# Patient Record
Sex: Female | Born: 1935 | Race: White | Hispanic: No | Smoking: Never smoker
Health system: Southern US, Community
[De-identification: ages and names within clinical notes are randomized; demographics above are authoritative.]

## PROBLEM LIST (undated history)

## (undated) DIAGNOSIS — K219 Gastro-esophageal reflux disease without esophagitis: Secondary | ICD-10-CM

## (undated) DIAGNOSIS — Z974 Presence of external hearing-aid: Secondary | ICD-10-CM

## (undated) DIAGNOSIS — M353 Polymyalgia rheumatica: Secondary | ICD-10-CM

## (undated) DIAGNOSIS — K5790 Diverticulosis of intestine, part unspecified, without perforation or abscess without bleeding: Secondary | ICD-10-CM

## (undated) DIAGNOSIS — Z972 Presence of dental prosthetic device (complete) (partial): Secondary | ICD-10-CM

## (undated) DIAGNOSIS — E785 Hyperlipidemia, unspecified: Secondary | ICD-10-CM

## (undated) DIAGNOSIS — I10 Essential (primary) hypertension: Secondary | ICD-10-CM

## (undated) HISTORY — DX: Polymyalgia rheumatica: M35.3

## (undated) HISTORY — PX: TONSILLECTOMY: SUR1361

## (undated) HISTORY — DX: Essential (primary) hypertension: I10

## (undated) HISTORY — DX: Diverticulosis of intestine, part unspecified, without perforation or abscess without bleeding: K57.90

## (undated) HISTORY — DX: Hyperlipidemia, unspecified: E78.5

## (undated) HISTORY — DX: Gastro-esophageal reflux disease without esophagitis: K21.9

---

## 2001-02-01 ENCOUNTER — Encounter: Payer: Self-pay | Admitting: Family Medicine

## 2002-09-04 ENCOUNTER — Encounter: Payer: Self-pay | Admitting: Family Medicine

## 2004-04-08 ENCOUNTER — Ambulatory Visit: Payer: Self-pay | Admitting: Family Medicine

## 2004-04-16 ENCOUNTER — Ambulatory Visit: Payer: Self-pay | Admitting: Family Medicine

## 2004-06-17 ENCOUNTER — Ambulatory Visit: Payer: Self-pay | Admitting: Family Medicine

## 2004-10-19 ENCOUNTER — Ambulatory Visit: Payer: Self-pay | Admitting: Family Medicine

## 2004-10-27 ENCOUNTER — Encounter: Payer: Self-pay | Admitting: Family Medicine

## 2004-11-12 ENCOUNTER — Ambulatory Visit: Payer: Self-pay | Admitting: Family Medicine

## 2004-12-28 ENCOUNTER — Ambulatory Visit: Payer: Self-pay | Admitting: Family Medicine

## 2005-06-21 ENCOUNTER — Ambulatory Visit: Payer: Self-pay | Admitting: Family Medicine

## 2005-07-12 HISTORY — PX: ANKLE FRACTURE SURGERY: SHX122

## 2005-10-20 ENCOUNTER — Ambulatory Visit: Payer: Self-pay | Admitting: Internal Medicine

## 2005-11-08 ENCOUNTER — Ambulatory Visit: Payer: Self-pay | Admitting: Internal Medicine

## 2005-11-22 ENCOUNTER — Ambulatory Visit: Payer: Self-pay | Admitting: Internal Medicine

## 2006-04-24 ENCOUNTER — Ambulatory Visit: Payer: Self-pay | Admitting: Internal Medicine

## 2006-04-24 LAB — CONVERTED CEMR LAB
ALT: 17 units/L (ref 0–40)
BUN: 9 mg/dL (ref 6–23)
CO2: 30 meq/L (ref 19–32)
Calcium: 9.3 mg/dL (ref 8.4–10.5)
Chloride: 101 meq/L (ref 96–112)
Cholesterol: 216 mg/dL (ref 0–200)
Direct LDL: 132.9 mg/dL
Glucose, Bld: 98 mg/dL (ref 70–99)
Potassium: 4.7 meq/L (ref 3.5–5.1)
Total CHOL/HDL Ratio: 3.1

## 2006-05-02 ENCOUNTER — Encounter: Payer: Self-pay | Admitting: Internal Medicine

## 2006-05-29 ENCOUNTER — Ambulatory Visit: Payer: Self-pay | Admitting: Internal Medicine

## 2006-05-29 LAB — CONVERTED CEMR LAB
ALT: 14 units/L (ref 0–40)
Cholesterol: 186 mg/dL (ref 0–200)
Total CHOL/HDL Ratio: 2.8
Triglycerides: 96 mg/dL (ref 0–149)

## 2006-07-06 ENCOUNTER — Ambulatory Visit: Payer: Self-pay | Admitting: Internal Medicine

## 2006-10-10 DIAGNOSIS — K5730 Diverticulosis of large intestine without perforation or abscess without bleeding: Secondary | ICD-10-CM | POA: Insufficient documentation

## 2006-10-10 DIAGNOSIS — I10 Essential (primary) hypertension: Secondary | ICD-10-CM | POA: Insufficient documentation

## 2006-10-10 DIAGNOSIS — M810 Age-related osteoporosis without current pathological fracture: Secondary | ICD-10-CM

## 2006-10-10 DIAGNOSIS — M353 Polymyalgia rheumatica: Secondary | ICD-10-CM | POA: Insufficient documentation

## 2006-10-10 DIAGNOSIS — K219 Gastro-esophageal reflux disease without esophagitis: Secondary | ICD-10-CM | POA: Insufficient documentation

## 2006-10-10 DIAGNOSIS — E785 Hyperlipidemia, unspecified: Secondary | ICD-10-CM | POA: Insufficient documentation

## 2006-10-23 ENCOUNTER — Ambulatory Visit: Payer: Self-pay | Admitting: Internal Medicine

## 2006-10-24 LAB — CONVERTED CEMR LAB
ALT: 14 units/L (ref 0–35)
BUN: 8 mg/dL (ref 6–23)
Calcium: 9.1 mg/dL (ref 8.4–10.5)
Chloride: 102 meq/L (ref 96–112)
Cholesterol: 175 mg/dL (ref 0–200)
GFR calc Af Amer: 89 mL/min
GFR calc non Af Amer: 73 mL/min
Glucose, Bld: 96 mg/dL (ref 70–99)
VLDL: 15 mg/dL (ref 0–40)

## 2006-12-06 ENCOUNTER — Ambulatory Visit: Payer: Self-pay | Admitting: *Deleted

## 2006-12-26 ENCOUNTER — Ambulatory Visit: Payer: Self-pay | Admitting: Internal Medicine

## 2007-04-25 ENCOUNTER — Ambulatory Visit: Payer: Self-pay | Admitting: Internal Medicine

## 2007-04-26 LAB — CONVERTED CEMR LAB
Albumin: 3.8 g/dL (ref 3.5–5.2)
BUN: 10 mg/dL (ref 6–23)
Basophils Absolute: 0 10*3/uL (ref 0.0–0.1)
Bilirubin, Direct: 0.2 mg/dL (ref 0.0–0.3)
CO2: 29 meq/L (ref 19–32)
Calcium: 9.6 mg/dL (ref 8.4–10.5)
Cholesterol: 188 mg/dL (ref 0–200)
Creatinine, Ser: 1 mg/dL (ref 0.4–1.2)
Eosinophils Absolute: 0.1 10*3/uL (ref 0.0–0.6)
GFR calc non Af Amer: 57 mL/min
HDL: 71.2 mg/dL (ref >39.0)
Hemoglobin: 13.1 g/dL (ref 12.0–15.0)
LDL Cholesterol: 101 mg/dL — ABNORMAL HIGH (ref 0–99)
Lymphocytes Relative: 26.5 % (ref 12.0–46.0)
MCHC: 33.5 g/dL (ref 30.0–36.0)
MCV: 91.8 fL (ref 78.0–100.0)
Monocytes Absolute: 0.3 10*3/uL (ref 0.2–0.7)
Monocytes Relative: 9.4 % (ref 3.0–11.0)
Neutro Abs: 2.2 10*3/uL (ref 1.4–7.7)
Neutrophils Relative %: 62.4 % (ref 43.0–77.0)
Potassium: 4.2 meq/L (ref 3.5–5.1)
Sed Rate: 17 mm/hr (ref 0–25)
TSH: 0.84 microintl units/mL (ref 0.35–5.50)

## 2007-07-18 ENCOUNTER — Telehealth: Payer: Self-pay | Admitting: Internal Medicine

## 2007-10-03 ENCOUNTER — Telehealth (INDEPENDENT_AMBULATORY_CARE_PROVIDER_SITE_OTHER): Payer: Self-pay | Admitting: *Deleted

## 2007-10-30 ENCOUNTER — Ambulatory Visit: Payer: Self-pay | Admitting: Internal Medicine

## 2007-10-30 DIAGNOSIS — L570 Actinic keratosis: Secondary | ICD-10-CM

## 2007-10-31 LAB — CONVERTED CEMR LAB
ALT: 13 units/L (ref 0–35)
AST: 21 units/L (ref 0–37)
Alkaline Phosphatase: 70 units/L (ref 39–117)
CO2: 28 meq/L (ref 19–32)
Calcium: 9.6 mg/dL (ref 8.4–10.5)
Creatinine, Ser: 0.8 mg/dL (ref 0.4–1.2)
Glucose, Bld: 93 mg/dL (ref 70–99)
HDL: 70.8 mg/dL (ref >39.0)
Phosphorus: 3.2 mg/dL (ref 2.3–4.6)
Sodium: 139 meq/L (ref 135–145)
Total Bilirubin: 0.7 mg/dL (ref 0.3–1.2)
Total CHOL/HDL Ratio: 2.7
Triglycerides: 83 mg/dL (ref 0–149)

## 2007-11-27 ENCOUNTER — Telehealth: Payer: Self-pay | Admitting: Internal Medicine

## 2008-03-17 ENCOUNTER — Telehealth: Payer: Self-pay | Admitting: Internal Medicine

## 2008-05-05 ENCOUNTER — Ambulatory Visit: Payer: Self-pay | Admitting: Internal Medicine

## 2008-10-14 ENCOUNTER — Ambulatory Visit: Payer: Self-pay | Admitting: Internal Medicine

## 2008-10-16 LAB — CONVERTED CEMR LAB
Alkaline Phosphatase: 54 units/L (ref 39–117)
BUN: 11 mg/dL (ref 6–23)
Bilirubin, Direct: 0.1 mg/dL (ref 0.0–0.3)
CO2: 29 meq/L (ref 19–32)
Calcium: 9.5 mg/dL (ref 8.4–10.5)
Chloride: 105 meq/L (ref 96–112)
Cholesterol: 162 mg/dL (ref 0–200)
Creatinine, Ser: 0.8 mg/dL (ref 0.4–1.2)
Eosinophils Absolute: 0.1 10*3/uL (ref 0.0–0.7)
Eosinophils Relative: 2.1 % (ref 0.0–5.0)
HCT: 36.7 % (ref 36.0–46.0)
LDL Cholesterol: 83 mg/dL (ref 0–99)
Lymphs Abs: 1 10*3/uL (ref 0.7–4.0)
MCHC: 33.8 g/dL (ref 30.0–36.0)
MCV: 95.8 fL (ref 78.0–100.0)
Monocytes Absolute: 0.4 10*3/uL (ref 0.1–1.0)
Neutrophils Relative %: 51.5 % (ref 43.0–77.0)
Platelets: 171 10*3/uL (ref 150.0–400.0)
Potassium: 5.2 meq/L — ABNORMAL HIGH (ref 3.5–5.1)
RDW: 11.7 % (ref 11.5–14.6)
Sed Rate: 10 mm/hr (ref 0–22)
TSH: 0.93 microintl units/mL (ref 0.35–5.50)
Total Bilirubin: 0.8 mg/dL (ref 0.3–1.2)
Total CHOL/HDL Ratio: 2
Total Protein: 6.1 g/dL (ref 6.0–8.3)
VLDL: 9.4 mg/dL (ref 0.0–40.0)
WBC: 3.1 10*3/uL — ABNORMAL LOW (ref 4.5–10.5)

## 2008-12-25 ENCOUNTER — Ambulatory Visit: Payer: Self-pay | Admitting: Internal Medicine

## 2009-05-29 ENCOUNTER — Ambulatory Visit: Payer: Self-pay | Admitting: Internal Medicine

## 2009-06-01 LAB — CONVERTED CEMR LAB
Albumin: 3.8 g/dL (ref 3.5–5.2)
Alkaline Phosphatase: 59 units/L (ref 39–117)
Basophils Relative: 0.1 % (ref 0.0–3.0)
CO2: 29 meq/L (ref 19–32)
Calcium: 9.4 mg/dL (ref 8.4–10.5)
Chloride: 101 meq/L (ref 96–112)
Eosinophils Relative: 1.6 % (ref 0.0–5.0)
HCT: 36.9 % (ref 36.0–46.0)
HDL: 95 mg/dL (ref >39.00)
LDL Cholesterol: 70 mg/dL (ref 0–99)
Lymphs Abs: 0.9 10*3/uL (ref 0.7–4.0)
MCHC: 33.5 g/dL (ref 30.0–36.0)
MCV: 94.2 fL (ref 78.0–100.0)
Monocytes Absolute: 0.5 10*3/uL (ref 0.1–1.0)
Neutro Abs: 1.6 10*3/uL (ref 1.4–7.7)
RBC: 3.92 M/uL (ref 3.87–5.11)
Sodium: 134 meq/L — ABNORMAL LOW (ref 135–145)
Total Bilirubin: 0.6 mg/dL (ref 0.3–1.2)
Total CHOL/HDL Ratio: 2
Triglycerides: 35 mg/dL (ref 0.0–149.0)
WBC: 3 10*3/uL — ABNORMAL LOW (ref 4.5–10.5)

## 2009-06-16 ENCOUNTER — Ambulatory Visit: Payer: Self-pay | Admitting: Ophthalmology

## 2009-06-16 ENCOUNTER — Encounter: Payer: Self-pay | Admitting: Internal Medicine

## 2009-07-01 ENCOUNTER — Ambulatory Visit: Payer: Self-pay | Admitting: Ophthalmology

## 2009-12-02 ENCOUNTER — Ambulatory Visit: Payer: Self-pay | Admitting: Internal Medicine

## 2009-12-04 LAB — CONVERTED CEMR LAB
Albumin: 3.7 g/dL (ref 3.5–5.2)
BUN: 13 mg/dL (ref 6–23)
Basophils Absolute: 0 10*3/uL (ref 0.0–0.1)
CO2: 29 meq/L (ref 19–32)
Calcium: 9.9 mg/dL (ref 8.4–10.5)
Chloride: 102 meq/L (ref 96–112)
GFR calc non Af Amer: 79.49 mL/min (ref >60)
HCT: 31.9 % — ABNORMAL LOW (ref 36.0–46.0)
Hemoglobin: 10.9 g/dL — ABNORMAL LOW (ref 12.0–15.0)
Lymphs Abs: 0.8 10*3/uL (ref 0.7–4.0)
MCHC: 34.2 g/dL (ref 30.0–36.0)
MCV: 94.8 fL (ref 78.0–100.0)
Monocytes Relative: 12.2 % — ABNORMAL HIGH (ref 3.0–12.0)
Neutro Abs: 2.1 10*3/uL (ref 1.4–7.7)
RDW: 13.5 % (ref 11.5–14.6)

## 2009-12-09 ENCOUNTER — Ambulatory Visit: Payer: Self-pay | Admitting: Internal Medicine

## 2009-12-09 DIAGNOSIS — D649 Anemia, unspecified: Secondary | ICD-10-CM | POA: Insufficient documentation

## 2009-12-10 LAB — CONVERTED CEMR LAB
Basophils Relative: 0.6 % (ref 0.0–3.0)
Eosinophils Relative: 1.2 % (ref 0.0–5.0)
Ferritin: 6.9 ng/mL — ABNORMAL LOW (ref 10.0–291.0)
Lymphocytes Relative: 25.8 % (ref 12.0–46.0)
MCV: 93.6 fL (ref 78.0–100.0)
Neutrophils Relative %: 58.3 % (ref 43.0–77.0)
RBC: 3.53 M/uL — ABNORMAL LOW (ref 3.87–5.11)
WBC: 3.6 10*3/uL — ABNORMAL LOW (ref 4.5–10.5)

## 2010-01-13 ENCOUNTER — Ambulatory Visit: Payer: Self-pay | Admitting: Internal Medicine

## 2010-01-15 LAB — CONVERTED CEMR LAB
Basophils Relative: 0.6 % (ref 0.0–3.0)
Eosinophils Absolute: 0.1 10*3/uL (ref 0.0–0.7)
HCT: 34.7 % — ABNORMAL LOW (ref 36.0–46.0)
Hemoglobin: 11.9 g/dL — ABNORMAL LOW (ref 12.0–15.0)
MCHC: 34.2 g/dL (ref 30.0–36.0)
MCV: 94.5 fL (ref 78.0–100.0)
Monocytes Absolute: 0.4 10*3/uL (ref 0.1–1.0)
Neutro Abs: 1.9 10*3/uL (ref 1.4–7.7)
RBC: 3.67 M/uL — ABNORMAL LOW (ref 3.87–5.11)

## 2010-01-18 ENCOUNTER — Ambulatory Visit: Payer: Self-pay | Admitting: Internal Medicine

## 2010-01-18 DIAGNOSIS — L600 Ingrowing nail: Secondary | ICD-10-CM

## 2010-03-14 HISTORY — PX: CATARACT EXTRACTION: SUR2

## 2010-04-13 NOTE — Assessment & Plan Note (Signed)
Summary: FOLLOW UP / [facility]FW   Vital Signs:  Patient profile:   76 year old female Weight:      148 pounds BMI:     24.72 Temp:     98.5 degrees F oral Pulse rate:   72 / minute Pulse rhythm:   regular BP sitting:   120 / 70  (left arm) Cuff size:   regular  Vitals Entered By: Mervin Hack CMA Duncan Dull) (December 02, 2009 8:20 AM) CC: 6 month follow-up   History of Present Illness: Feels well Has ill family members---40 year old brother in hospital now. Some stress Likes to visit at health care and [facility] No new health concerns  Wonders about getting off the statin No myalgias discussed the statistical decrease in MI risk but not necessarily mortality  Occ arthritic pain occ takes aleve but not regularly only if she overdoes it --like working in yard  Continues on omeprazole seems to control her reflux  No chest pain  No SOB  Lots of moles seeing derm next month  Allergies: 1)  Fosamax (Alendronate Sodium)  Past History:  Past medical, surgical, family and social histories (including risk factors) reviewed for relevance to current acute and chronic problems.  Past Medical History: Reviewed history from 10/10/2006 and no changes required. Diverticulosis, colon GERD Hyperlipidemia Hypertension Osteoporosis Polymyalgia rheumatica  Past Surgical History: Reviewed history from 10/10/2006 and no changes required. Dexa-osteopenia 05/03 Tonsillectomy Colonoscopy- diverticuli, int. hemorrhoids 04/99 Dexa- Osteoporosis  03/05 Left ankle fx Ether Griffins) 05/07 Bone Density 02/08  Family History: Reviewed history from 10/10/2006 and no changes required. Father: Died at age 24 (? heart) Mother: Died at age 26, RA Siblings: One brother died with diabetes, sister deceased, polio- was twin sister. Brother died of old age, sister died with ovarian cancer, sister died with ? brain cancer, sister CVA died in her 97's.  Social History: Reviewed history from  12/26/2006 and no changes required.  Widowed Children: 2 daughters Occupation: Use to farm, housewife  Never Smoked Alcohol use-rare  Requests DNR 2 daughters but neither in town. Feels they should share the responsibility of health care POA. Requests no feeding tube. Does not want extended ventilator support.  Review of Systems       Weight is stable appetite is great sleeps okay  Physical Exam  General:  alert and normal appearance.   Neck:  supple, no masses, no thyromegaly, no carotid bruits, and no cervical lymphadenopathy.   Lungs:  normal respiratory effort, no intercostal retractions, no accessory muscle use, and normal breath sounds.   Heart:  normal rate, regular rhythm, no murmur, and no gallop.   Abdomen:  soft and non-tender.   Msk:  no joint tenderness and no joint swelling.   Pulses:  1+ in feet Extremities:  no edema Neurologic:  alert & oriented X3, strength normal in all extremities, and gait normal.   Psych:  normally interactive, good eye contact, not anxious appearing, and not depressed appearing.     Impression & Recommendations:  Problem # 1:  HYPERTENSION (ICD-401.9) Assessment Unchanged  doing well no changes needed  Her updated medication list for this problem includes:    Hyzaar 50-12.5 Mg Tabs (Losartan potassium-hctz) .Marland Kitchen... Take one by mouth once a day  BP today: 120/70 Prior BP: 120/60 (05/29/2009)  Labs Reviewed: K+: 4.1 (05/29/2009) Creat: : 0.8 (05/29/2009)   Chol: 172 (05/29/2009)   HDL: 95.00 (05/29/2009)   LDL: 70 (05/29/2009)   TG: 35.0 (05/29/2009)  Orders:  TLB-Renal Function Panel (80069-RENAL) TLB-CBC Platelet - w/Differential (85025-CBCD)  Problem # 2:  HYPERLIPIDEMIA (ICD-272.4) Assessment: Unchanged discussed primary prevention will continue but stop the fish oil since triglycerides very low  Her updated medication list for this problem includes:    Pravachol 20 Mg Tabs (Pravastatin sodium) .Marland Kitchen... Take one by mouth  once a day  Problem # 3:  GERD (ICD-530.81) Assessment: Unchanged controlled on the med  Her updated medication list for this problem includes:    Prilosec 20 Mg Cpdr (Omeprazole) .Marland Kitchen... Take one by mouth once a day  Problem # 4:  POLYMYALGIA RHEUMATICA (ICD-725) Assessment: Comment Only  no evidence of recurrence  Her updated medication list for this problem includes:    Aspirin 81 Mg Tbec (Aspirin) .Marland Kitchen... Take one by mouth once a day  Orders: TLB-Sedimentation Rate (ESR) (85652-ESR) Venipuncture (16109)  Complete Medication List: 1)  Prilosec 20 Mg Cpdr (Omeprazole) .... Take one by mouth once a day 2)  Pravachol 20 Mg Tabs (Pravastatin sodium) .... Take one by mouth once a day 3)  Hyzaar 50-12.5 Mg Tabs (Losartan potassium-hctz) .... Take one by mouth once a day 4)  Calcium-vitamin D 600-200 Mg-unit Tabs (Calcium-vitamin d) .... Take 1 by mouth once daily 5)  Womens Multivitamin Plus Tabs (Multiple vitamins-minerals) .... Take 1 by mouth once daily 6)  Co Q-10 150 Mg Caps (Coenzyme q10) .... Once daily 7)  Aspirin 81 Mg Tbec (Aspirin) .... Take one by mouth once a day  Patient Instructions: 1)  Please try off the fish oil---you can restart it if you feel it makes you feel better 2)  Please schedule a follow-up appointment in 6 months .   Current Allergies (reviewed today): FOSAMAX (ALENDRONATE SODIUM)   Immunization History:  Tetanus/Td Immunization History:    Tetanus/Td:  Td (07/09/2002)  Influenza Immunization History:    Influenza:  Fluvax 3+ (12/25/2008)  Pneumovax Immunization History:    Pneumovax:  Historical (03/14/2004)  Zostavax History:    Zostavax # 1:  Zostavax (05/03/2006)

## 2010-04-13 NOTE — Assessment & Plan Note (Signed)
Summary: INGROWN TOE NAILS ON BOTH FEET / LFW   Vital Signs:  Patient profile:   77 year old female Weight:      150 pounds Temp:     98.4 degrees F oral BP sitting:   128 / 70  (left arm) Cuff size:   regular  Vitals Entered By: Mervin Hack CMA Duncan Dull) (January 18, 2010 10:23 AM) CC: ingrown toenails   History of Present Illness: Has had some ongoing problems with ingrown nails on both feet wears open toe sandals in summer-not as much trouble now with closed shoes, she has more pain  Pain mostly on medial sides of both great toes---right > left no sig inflammation  No discharge  Allergies: 1)  Fosamax (Alendronate Sodium)  Past History:  Past medical, surgical, family and social histories (including risk factors) reviewed for relevance to current acute and chronic problems.  Past Medical History: Reviewed history from 10/10/2006 and no changes required. Diverticulosis, colon GERD Hyperlipidemia Hypertension Osteoporosis Polymyalgia rheumatica  Past Surgical History: Reviewed history from 10/10/2006 and no changes required. Dexa-osteopenia 05/03 Tonsillectomy Colonoscopy- diverticuli, int. hemorrhoids 04/99 Dexa- Osteoporosis  03/05 Left ankle fx Ether Griffins) 05/07 Bone Density 02/08  Family History: Reviewed history from 10/10/2006 and no changes required. Father: Died at age 76 (? heart) Mother: Died at age 15, RA Siblings: One brother died with diabetes, sister deceased, polio- was twin sister. Brother died of old age, sister died with ovarian cancer, sister died with ? brain cancer, sister CVA died in her 76's.  Social History: Reviewed history from 12/26/2006 and no changes required.  Widowed Children: 2 daughters Occupation: Use to farm, housewife  Never Smoked Alcohol use-rare  Requests DNR 2 daughters but neither in town. Feels they should share the responsibility of health care POA. Requests no feeding tube. Does not want extended ventilator  support.  Review of Systems       reviewed blood work ---I had recommended stopping iron she wants to continue at least part of the time  Physical Exam  General:  alert and normal appearance.   Extremities:  mild distal ingrowing of thick dystrophic nails  right great toe > left using elevator, I mobilized the nail and trimmed them both back--including some calloused area on medial right great toe   Impression & Recommendations:  Problem # 1:  INGROWN TOENAIL (ICD-703.0) Assessment New distal so not as severe trimmed here I instructed her on soaking, then moving back cuticle and trimming on her own  Complete Medication List: 1)  Pravachol 20 Mg Tabs (Pravastatin sodium) .... Take one by mouth once a day 2)  Hyzaar 50-12.5 Mg Tabs (Losartan potassium-hctz) .... Take one by mouth once a day 3)  Calcium-vitamin D 600-200 Mg-unit Tabs (Calcium-vitamin d) .... Take 1 by mouth once daily 4)  Womens Multivitamin Plus Tabs (Multiple vitamins-minerals) .... Take 1 by mouth once daily 5)  Aspirin 81 Mg Tbec (Aspirin) .... Take one by mouth once a day  Patient Instructions: 1)  Please keep regualr follow up   Orders Added: 1)  Est. Patient Level III [15176]    Current Allergies (reviewed today): FOSAMAX (ALENDRONATE SODIUM)

## 2010-04-13 NOTE — Assessment & Plan Note (Signed)
Summary: 6 MONTH FOLLOW UP/[facility]   Vital Signs:  Patient profile:   77 year old female Weight:      149 pounds Temp:     98 degrees F oral Pulse rate:   60 / minute Pulse rhythm:   regular BP sitting:   120 / 60  (left arm) Cuff size:   regular  Vitals Entered By: Mervin Hack CMA Duncan Dull) (May 29, 2009 8:10 AM) CC: 6 month follow-up   History of Present Illness: Doing "great"  Setting up appt for right cataract extraction Left isn't ready  Notes her memory is not quite as good as before No problems with executive function No apraxia Does regular exercise---walks and water aerobics---discussed the protective effect on cognitive functrion  Occ ankle pain from past fracture using brace for walking No sig other arthritic problems  Has some itching in back--esp at night no clear etiology no rash  More GI gas not sure why can get some relief from [facility]  Ingrown toenail --lateral right great toe discussed that I can remove as needed   Still with uterine prolapse not clearly worse No incontinence but will have some urgency She does push it back up sometimes  No chest pain No SOB No edema No change in exercise tolerance   Allergies: 1)  Fosamax (Alendronate Sodium)  Past History:  Past medical, surgical, family and social histories (including risk factors) reviewed for relevance to current acute and chronic problems.  Past Medical History: Reviewed history from 10/10/2006 and no changes required. Diverticulosis, colon GERD Hyperlipidemia Hypertension Osteoporosis Polymyalgia rheumatica  Past Surgical History: Reviewed history from 10/10/2006 and no changes required. Dexa-osteopenia 05/03 Tonsillectomy Colonoscopy- diverticuli, int. hemorrhoids 04/99 Dexa- Osteoporosis  03/05 Left ankle fx Ether Griffins) 05/07 Bone Density 02/08  Family History: Reviewed history from 10/10/2006 and no changes required. Father: Died at age 82 (? heart) Mother: Died  at age 61, RA Siblings: One brother died with diabetes, sister deceased, polio- was twin sister. Brother died of old age, sister died with ovarian cancer, sister died with ? brain cancer, sister CVA died in her 31's.  Social History: Reviewed history from 12/26/2006 and no changes required.  Widowed Children: 2 daughters Occupation: Use to farm, housewife  Never Smoked Alcohol use-rare  Requests DNR 2 daughters but neither in town. Feels they should share the responsibility of health care POA. Requests no feeding tube. Does not want extended ventilator support.  Review of Systems       wonders about ear was some decreased hearing Weight is stable Appetite is fine Sleeps well Stays happy  Physical Exam  General:  alert and normal appearance.   Neck:  supple, no masses, no thyromegaly, no carotid bruits, and no cervical lymphadenopathy.   Lungs:  normal respiratory effort and normal breath sounds.   Heart:  normal rate, regular rhythm, no murmur, and no gallop.   Abdomen:  soft and non-tender.   Msk:  no joint tenderness and no joint swelling.   Pulses:  1+ in feet Extremities:  No edema slightly lateral ingrowing in right 1st toe Skin:  seb keratosis on upper back--slight surrounding irritation Psych:  normally interactive, good eye contact, not anxious appearing, and not depressed appearing.     Impression & Recommendations:  Problem # 1:  HYPERTENSION (ICD-401.9) Assessment Unchanged  good control no changes needed  Her updated medication list for this problem includes:    Hyzaar 50-12.5 Mg Tabs (Losartan potassium-hctz) .Marland Kitchen... Take one by mouth once a  day  BP today: 120/60 Prior BP: 122/68 (10/14/2008)  Labs Reviewed: K+: 5.2 (10/14/2008) Creat: : 0.8 (10/14/2008)   Chol: 162 (10/14/2008)   HDL: 69.80 (10/14/2008)   LDL: 83 (10/14/2008)   TG: 47.0 (10/14/2008)  Orders: TLB-Renal Function Panel (80069-RENAL) TLB-CBC Platelet - w/Differential  (85025-CBCD) TLB-TSH (Thyroid Stimulating Hormone) (84443-TSH)  Problem # 2:  HYPERLIPIDEMIA (ICD-272.4) Assessment: Unchanged  good control wiil check labs  Her updated medication list for this problem includes:    Pravachol 20 Mg Tabs (Pravastatin sodium) .Marland Kitchen... Take one by mouth once a day  Labs Reviewed: SGOT: 20 (10/14/2008)   SGPT: 14 (10/14/2008)   HDL:69.80 (10/14/2008), 70.8 (10/30/2007)  LDL:83 (10/14/2008), 104 (16/12/9602)  Chol:162 (10/14/2008), 191 (10/30/2007)  Trig:47.0 (10/14/2008), 83 (10/30/2007)  Orders: TLB-Lipid Panel (80061-LIPID) TLB-Hepatic/Liver Function Pnl (80076-HEPATIC)  Problem # 3:  POLYMYALGIA RHEUMATICA (ICD-725) Assessment: Unchanged  no symptoms will check sed rate  Orders: TLB-Sedimentation Rate (ESR) (85652-ESR) Venipuncture (54098)  Problem # 4:  OSTEOPOROSIS (ICD-733.00) Assessment: Unchanged on vitamin D regular weight bearing exercise  Her updated medication list for this problem includes:    Calcium-vitamin D 600-200 Mg-unit Tabs (Calcium-vitamin d) .Marland Kitchen... Take 1 by mouth once daily  Complete Medication List: 1)  Pravachol 20 Mg Tabs (Pravastatin sodium) .... Take one by mouth once a day 2)  Aspirin 81 Mg Tbec (Aspirin) .... Take one by mouth once a day 3)  Prilosec 20 Mg Cpdr (Omeprazole) .... Take one by mouth once a day 4)  Hyzaar 50-12.5 Mg Tabs (Losartan potassium-hctz) .... Take one by mouth once a day 5)  Calcium-vitamin D 600-200 Mg-unit Tabs (Calcium-vitamin d) .... Take 1 by mouth once daily 6)  Womens Multivitamin Plus Tabs (Multiple vitamins-minerals) .... Take 1 by mouth once daily 7)  Fish Oil 1000 Mg Caps (Omega-3 fatty acids) .... Once daily 8)  Co Q-10 150 Mg Caps (Coenzyme q10) .... Once daily  Patient Instructions: 1)  Please schedule a follow-up appointment in 6 months .   Current Allergies (reviewed today): FOSAMAX (ALENDRONATE SODIUM)

## 2010-04-21 ENCOUNTER — Encounter: Payer: Self-pay | Admitting: Internal Medicine

## 2010-04-21 LAB — HM COLONOSCOPY

## 2010-04-21 LAB — FECAL OCCULT BLOOD, GUAIAC: Fecal Occult Blood: NEGATIVE

## 2010-04-23 ENCOUNTER — Encounter: Payer: Self-pay | Admitting: Internal Medicine

## 2010-05-11 NOTE — Miscellaneous (Signed)
Summary: Clearance for Exercise Program/[place]  Clearance for Exercise Program/[place]   Imported By: Lanelle Bal 05/04/2010 08:58:58  _____________________________________________________________________  External Attachment:    Type:   Image     Comment:   External Document

## 2010-06-09 ENCOUNTER — Encounter: Payer: Self-pay | Admitting: Internal Medicine

## 2010-06-09 ENCOUNTER — Ambulatory Visit (INDEPENDENT_AMBULATORY_CARE_PROVIDER_SITE_OTHER): Payer: Medicare Other | Admitting: Internal Medicine

## 2010-06-09 VITALS — BP 143/61 | HR 68 | Temp 98.5°F | Ht 65.0 in | Wt 150.0 lb

## 2010-06-09 DIAGNOSIS — I10 Essential (primary) hypertension: Secondary | ICD-10-CM

## 2010-06-09 DIAGNOSIS — M353 Polymyalgia rheumatica: Secondary | ICD-10-CM

## 2010-06-09 DIAGNOSIS — K219 Gastro-esophageal reflux disease without esophagitis: Secondary | ICD-10-CM

## 2010-06-09 DIAGNOSIS — E785 Hyperlipidemia, unspecified: Secondary | ICD-10-CM

## 2010-06-09 LAB — BASIC METABOLIC PANEL
CO2: 29 mEq/L (ref 19–32)
Calcium: 9.7 mg/dL (ref 8.4–10.5)
Creatinine, Ser: 0.7 mg/dL (ref 0.4–1.2)
Glucose, Bld: 89 mg/dL (ref 70–99)
Sodium: 132 mEq/L — ABNORMAL LOW (ref 135–145)

## 2010-06-09 LAB — LIPID PANEL
HDL: 85.1 mg/dL (ref >39.00)
Triglycerides: 68 mg/dL (ref 0.0–149.0)

## 2010-06-09 NOTE — Progress Notes (Signed)
  Subjective:    [name], female    DOB: 1923/09/14, 77 y.o.   MRN: [id number]  HPI "I feel like my life is changing--I'm getting older" Thinks it "may be a changing year for me"  Cataract done on right eye--not much help Has needed new glasses--notes change but can see okay  Has started with the fitness program at [facility] Has been doing some work outside Has had some leg pain associated with this Went to [facility] podiatry to check left ankle pain after twisting---x-ray negative Notes some improvement lately  Ingrown nails are better  No chest pain  No SOB  Did stop the statin in February No difference in how she feels Had discussed questions about primary prevention  Stomach is fine Continues on the omeprazole  Past Medical History  Diagnosis Date  . Diverticulosis   . GERD (gastroesophageal reflux disease)   . Hyperlipidemia   . Hypertension   . Osteoporosis   . Polymyalgia rheumatica     Past Surgical History  Procedure Date  . Tonsillectomy   . Ankle fracture surgery 07/12/2005  . Cataract extraction 2012    right    Family History  Problem Relation Age of Onset  . Arthritis Mother   . Heart disease Father 61  . Diabetes Brother   . Cancer Sister     ovarian  . Stroke Sister     History   Social History  . Marital Status: Widowed    Spouse Name: N/A    Number of Children: 2  . Years of Education: N/A   Occupational History  . House wife, farming    Social History Main Topics  . Smoking status: Never Smoker   . Smokeless tobacco: Not on file  . Alcohol Use: Yes  . Drug Use:   . Sexually Active:    Other Topics Concern  . Not on file   Social History Narrative   Request DNR.  Had two daughters but neither in town.  Feels they should share responsibility of health care POA.  Request no feeding tube.  Does not want extended ventilator support.     Review of Systems Had recent dental work done---decided on extraction  instead of root canal, etc Appetite is fine Weight is stable Sleeps okay Bladder "more active"--has prolapsed uterus    Objective:   Physical Exam  Constitutional: She appears well-developed and well-nourished. No distress.  Neck: Normal range of motion. Neck supple. No thyromegaly present.  Cardiovascular: Normal rate, regular rhythm and normal heart sounds.  Exam reveals no gallop.   No murmur heard. Pulmonary/Chest: Effort normal and breath sounds normal. No respiratory distress. She has no wheezes. She has no rales.  Abdominal: Soft. She exhibits no mass. There is no tenderness.  Musculoskeletal: Normal range of motion. She exhibits no edema and no tenderness.  Lymphadenopathy:    She has no cervical adenopathy.  Skin: No rash noted.  Psychiatric: She has a normal mood and affect. Judgment and thought content normal.          Assessment & Plan:

## 2010-10-07 ENCOUNTER — Ambulatory Visit (INDEPENDENT_AMBULATORY_CARE_PROVIDER_SITE_OTHER): Payer: Medicare Other | Admitting: Family Medicine

## 2010-10-07 VITALS — BP 112/70 | HR 73 | Temp 98.8°F | Wt 150.0 lb

## 2010-10-07 DIAGNOSIS — J029 Acute pharyngitis, unspecified: Secondary | ICD-10-CM

## 2010-10-07 DIAGNOSIS — J069 Acute upper respiratory infection, unspecified: Secondary | ICD-10-CM

## 2010-10-07 NOTE — Patient Instructions (Signed)
Drink plenty of fluids, take tylenol as needed, and gargle with warm salt water for your throat.  This should gradually improve.  Take care.  Let us know if you have other concerns.    

## 2010-10-07 NOTE — Progress Notes (Signed)
duration of symptoms: ST started yesterday, worse last night.   Rhinorrhea: minimal congestion:no ear pain:no sore throat: as above Cough: minimal, today myalgias:no other concerns:voice change noted Leaving for [place], but not until mid August No fevers.    ROS: See HPI.  Otherwise negative.    Meds, vitals, and allergies reviewed.   GEN: nad, alert and oriented HEENT: mucous membranes moist, TM w/o erythema, nasal epithelium injected, OP with cobblestoning- mild NECK: supple w/o LA CV: rrr. PULM: ctab, no inc wob ABD: soft, +bs  RST neg.

## 2010-10-07 NOTE — Assessment & Plan Note (Signed)
Likely benign, self-limited viral proceed.  ddx d/w pt.  Supportive care, f/u prn.  No indication for abx.  RST neg.

## 2010-10-11 ENCOUNTER — Other Ambulatory Visit: Payer: Self-pay | Admitting: Family Medicine

## 2010-10-11 MED ORDER — BENZONATATE 100 MG PO CAPS: 100.0000 mg | ORAL_CAPSULE | Freq: Four times a day (QID) | ORAL | Status: DC | PRN

## 2010-10-11 NOTE — Progress Notes (Signed)
Received email from Rowe Clack, RN at [facility]:  Hi Dr. Dayton Martes, I saw Ann Odom today in the clinic and she sounded terrible (very congested and coughing). She told me that she had seen Dr. Para March last Thursday in place of Dr. Alphonsus Sias and he told her that there was no infection and that the symptoms would go away on their own. They have actually gotten worse and she is coughing quite frequently. She does not have a temperature but says she feels just "run down and weak". I called the office but haven't heard back and she was wanting to know if something could be done this afternoon. She specifically asked for something for her cough-is there anything over the counter I could tell her to try or does she need to come in to be seen?  Thanks, Rowe Clack, RN, BSN  I wrote back stating that I could send Ann Odom into her pharmacy but pt would need to be seen before I could prescribe an antibiotic. Rx for [facility] sent to [facility] in [place].

## 2010-10-14 ENCOUNTER — Encounter: Payer: Self-pay | Admitting: Family Medicine

## 2010-10-14 ENCOUNTER — Ambulatory Visit (INDEPENDENT_AMBULATORY_CARE_PROVIDER_SITE_OTHER): Payer: Medicare Other | Admitting: Family Medicine

## 2010-10-14 VITALS — BP 130/80 | HR 79 | Temp 98.1°F | Wt 154.5 lb

## 2010-10-14 DIAGNOSIS — J069 Acute upper respiratory infection, unspecified: Secondary | ICD-10-CM

## 2010-10-14 MED ORDER — AZITHROMYCIN 250 MG PO TABS: ORAL_TABLET | ORAL | Status: AC

## 2010-10-14 NOTE — Progress Notes (Signed)
77 yo here for follow up of URI symptoms. RST was neg. Saw Dr. Para March on 10/07/2010, advised supportive care as likely viral illness. On 7/30, I sent in rx for Tessalon as cough was worsening.  Today, feels some symptoms are better but others are worse. Cough is now productive. Has sinus pressure.  No fevers, some chills and fatigue..    ROS: See HPI.  Otherwise negative.    Meds, vitals, and allergies reviewed.  BP 130/80  Pulse 79  Temp(Src) 98.1 F (36.7 C) (Oral)  Wt 154 lb 8 oz (70.081 kg)  GEN: nad, alert and oriented HEENT: mucous membranes moist, TM w/o erythema, nasal epithelium injected, OP with cobblestoning- mild NECK: supple w/o LA CV: rrr. PULM: +faint rhonchi RLL ABD: soft, +bs  Assessment and Plan: 1. URI (upper respiratory infection)   Now with rhonchi on exam. Given duration and progression of symptoms, will treat for bacterial sinusitis/bronchitis with Zpack. Continue supportive care as discussed. The patient indicates understanding of these issues and agrees with the plan.

## 2010-11-05 ENCOUNTER — Ambulatory Visit (INDEPENDENT_AMBULATORY_CARE_PROVIDER_SITE_OTHER): Payer: Medicare Other | Admitting: Internal Medicine

## 2010-11-05 ENCOUNTER — Encounter: Payer: Self-pay | Admitting: Internal Medicine

## 2010-11-05 DIAGNOSIS — R5383 Other fatigue: Secondary | ICD-10-CM | POA: Insufficient documentation

## 2010-11-05 DIAGNOSIS — I10 Essential (primary) hypertension: Secondary | ICD-10-CM

## 2010-11-05 DIAGNOSIS — E785 Hyperlipidemia, unspecified: Secondary | ICD-10-CM

## 2010-11-05 LAB — BASIC METABOLIC PANEL
BUN: 16 mg/dL (ref 6–23)
CO2: 27 mEq/L (ref 19–32)
Calcium: 9.4 mg/dL (ref 8.4–10.5)
Creatinine, Ser: 0.7 mg/dL (ref 0.4–1.2)
Glucose, Bld: 96 mg/dL (ref 70–99)

## 2010-11-05 LAB — CBC WITH DIFFERENTIAL/PLATELET
Eosinophils Relative: 2.2 % (ref 0.0–5.0)
HCT: 36.6 % (ref 36.0–46.0)
Hemoglobin: 12.3 g/dL (ref 12.0–15.0)
Lymphs Abs: 0.9 10*3/uL (ref 0.7–4.0)
Monocytes Relative: 12.5 % — ABNORMAL HIGH (ref 3.0–12.0)
Neutro Abs: 2.1 10*3/uL (ref 1.4–7.7)
RDW: 13.1 % (ref 11.5–14.6)
WBC: 3.6 10*3/uL — ABNORMAL LOW (ref 4.5–10.5)

## 2010-11-05 LAB — HEPATIC FUNCTION PANEL
ALT: 20 U/L (ref 0–35)
Total Protein: 6.5 g/dL (ref 6.0–8.3)

## 2010-11-05 NOTE — Patient Instructions (Signed)
Cancel October appt 

## 2010-11-05 NOTE — Progress Notes (Signed)
Subjective:    [name], female    DOB: December 05, 1923, 77 y.o.   MRN: [id number]  HPI Summer has been "a wash out" fo rme Has been sick much of the time Weight is up due to eating comfort foods  Feels better from respiratory standpoint Just go back from grandson's wedding in [place] Had a good time and feels "recharged"  Still with remnants of cough Feels it started after going "gung-ho" at fitness program at [facility] [place] Has had to cut back with illnesses and not even walking much  Concerned about health issues Wonders about her cholesterol, whether she needs iron  Does have some degree of fatigue Not walking, staying at home Sleep is variable--not overly refreshed in AM this summer. Some better of late  No chest pain--occ sensation briefly in upper left chest Breathing is okay  Current Outpatient Prescriptions on File Prior to Visit  Medication Sig Dispense Refill  . aspirin 81 MG tablet Take 81 mg by mouth daily.        Marland Kitchen losartan-hydrochlorothiazide (HYZAAR) 50-12.5 MG per tablet Take 1 tablet by mouth daily.        . Multiple Vitamin (MULTIVITAMIN) capsule Take 1 capsule by mouth daily.        Marland Kitchen omeprazole (PRILOSEC) 20 MG capsule Take 20 mg by mouth daily.          Allergies  Allergen Reactions  . Alendronate Sodium     REACTION: GI    Past Medical History  Diagnosis Date  . Diverticulosis   . GERD (gastroesophageal reflux disease)   . Hyperlipidemia   . Hypertension   . Osteoporosis   . Polymyalgia rheumatica     Past Surgical History  Procedure Date  . Tonsillectomy   . Ankle fracture surgery 07/12/2005  . Cataract extraction 2012    right    Family History  Problem Relation Age of Onset  . Arthritis Mother   . Heart disease Father 40  . Diabetes Brother   . Cancer Sister     ovarian  . Stroke Sister     History   Social History  . Marital Status: Widowed    Spouse Name: N/A    Number of Children: 2  . Years of Education:  N/A   Occupational History  . House wife, farming    Social History Main Topics  . Smoking status: Never Smoker   . Smokeless tobacco: Not on file  . Alcohol Use: Yes  . Drug Use:   . Sexually Active:    Other Topics Concern  . Not on file   Social History Narrative   Request DNR.  Had two daughters but neither in town.  Feels they should share responsibility of health care POA.  Request no feeding tube.  Does not want extended ventilator support.   Review of Systems Starting spiritual health program at church Appetite is fine    Objective:   Physical Exam  Constitutional: She appears well-developed and well-nourished. No distress.  Neck: Normal range of motion. Neck supple. No thyromegaly present.  Cardiovascular: Normal rate, regular rhythm, normal heart sounds and intact distal pulses.  Exam reveals no gallop.   No murmur heard. Pulmonary/Chest: Effort normal and breath sounds normal. No respiratory distress. She has no wheezes. She has no rales.  Abdominal: Soft. She exhibits no mass. There is no tenderness.  Musculoskeletal: Normal range of motion. She exhibits no edema and no tenderness.  Lymphadenopathy:    She  has no cervical adenopathy.    She has no axillary adenopathy.       Right: No inguinal adenopathy present.       Left: No inguinal adenopathy present.  Psychiatric: She has a normal mood and affect. Her behavior is normal. Judgment and thought content normal.          Assessment & Plan:

## 2010-11-05 NOTE — Assessment & Plan Note (Signed)
BP Readings from Last 3 Encounters:  11/05/10 149/68  10/14/10 130/80  10/07/10 112/70   Up some today Generally good No change for now

## 2010-11-05 NOTE — Assessment & Plan Note (Signed)
Lab Results  Component Value Date   LDLCALC 70 05/29/2009   No reason for Rx

## 2010-11-05 NOTE — Assessment & Plan Note (Signed)
Non specific Has gotten her confidence shook by recent illness Reassured about exam Will check labs Get back to regular exercise schedule

## 2010-12-03 ENCOUNTER — Other Ambulatory Visit: Payer: Self-pay | Admitting: Internal Medicine

## 2010-12-14 ENCOUNTER — Ambulatory Visit: Payer: Medicare Other | Admitting: Internal Medicine

## 2011-03-15 LAB — HM DEXA SCAN

## 2011-03-18 ENCOUNTER — Ambulatory Visit: Payer: Medicare Other | Admitting: Internal Medicine

## 2011-03-21 ENCOUNTER — Ambulatory Visit (INDEPENDENT_AMBULATORY_CARE_PROVIDER_SITE_OTHER): Payer: Medicare Other | Admitting: Family Medicine

## 2011-03-21 ENCOUNTER — Encounter: Payer: Self-pay | Admitting: Family Medicine

## 2011-03-21 VITALS — BP 140/62 | HR 71 | Temp 98.5°F | Wt 155.5 lb

## 2011-03-21 DIAGNOSIS — J069 Acute upper respiratory infection, unspecified: Secondary | ICD-10-CM

## 2011-03-21 MED ORDER — AZITHROMYCIN 250 MG PO TABS: ORAL_TABLET | ORAL | Status: AC

## 2011-03-21 MED ORDER — BENZONATATE 100 MG PO CAPS: 100.0000 mg | ORAL_CAPSULE | Freq: Four times a day (QID) | ORAL | Status: DC | PRN

## 2011-03-21 NOTE — Patient Instructions (Signed)
Take antibiotic as directed.  Drink lots of fluids.  Treat sympotmatically with Mucinex, nasal saline irrigation, and Tylenol/Ibuprofen.Cough suppressant as needed. Call if not improving as expected in 5-7 days.

## 2011-03-21 NOTE — Progress Notes (Signed)
SUBJECTIVE:  Ann Odom is a 78 y.o. female who complains of coryza, congestion, sore throat, myalgias and fever for 12 days. She denies a history of anorexia, chest pain, dizziness, shortness of breath and wheezing and denies a history of asthma. Patient denies smoke cigarettes.   Patient Active Problem List  Diagnoses  . HYPERLIPIDEMIA  . UNSPECIFIED ANEMIA  . HYPERTENSION  . GERD  . DIVERTICULOSIS, COLON  . ACTINIC KERATOSIS  . POLYMYALGIA RHEUMATICA  . OSTEOPOROSIS  . Fatigue   Past Medical History  Diagnosis Date  . Diverticulosis   . GERD (gastroesophageal reflux disease)   . Hyperlipidemia   . Hypertension   . Osteoporosis   . Polymyalgia rheumatica    Past Surgical History  Procedure Date  . Tonsillectomy   . Ankle fracture surgery 07/12/2005  . Cataract extraction 2012    right   History  Substance Use Topics  . Smoking status: Never Smoker   . Smokeless tobacco: Not on file  . Alcohol Use: Yes   Family History  Problem Relation Age of Onset  . Arthritis Mother   . Heart disease Father 15  . Diabetes Brother   . Cancer Sister     ovarian  . Stroke Sister    Allergies  Allergen Reactions  . Alendronate Sodium     REACTION: GI   Current Outpatient Prescriptions on File Prior to Visit  Medication Sig Dispense Refill  . aspirin 81 MG tablet Take 81 mg by mouth daily.        . fish oil-omega-3 fatty acids 1000 MG capsule Take 2 g by mouth daily.        Marland Kitchen losartan-hydrochlorothiazide (HYZAAR) 50-12.5 MG per tablet TAKE ONE TABLET BY MOUTH EVERY DAY  90 tablet  3  . Multiple Vitamin (MULTIVITAMIN) capsule Take 1 capsule by mouth daily.        Marland Kitchen omeprazole (PRILOSEC) 20 MG capsule Take 20 mg by mouth daily.         The PMH, PSH, Social History, Family History, Medications, and allergies have been reviewed in [facility] and have been updated if relevant.  OBJECTIVE: BP 140/62  Pulse 71  Temp(Src) 98.5 F (36.9 C) (Oral)  Wt 155 lb 8 oz (70.534  kg)  She appears well, vital signs are as noted. Ears normal.  Throat and pharynx normal.  Neck supple. No adenopathy in the neck. Nose is congested. Sinuses non tender. No increased WOB, possible faint ronchi on RLL.  ASSESSMENT:  sinusitis  PLAN: Given duration and progression of symptoms with unilateral ronchi, will treat for bacterial process. Symptomatic therapy suggested: push fluids, rest and return office visit prn if symptoms persist or worsen.  Call or return to clinic prn if these symptoms worsen or fail to improve as anticipated.

## 2011-03-29 ENCOUNTER — Ambulatory Visit (INDEPENDENT_AMBULATORY_CARE_PROVIDER_SITE_OTHER): Payer: Medicare Other | Admitting: Internal Medicine

## 2011-03-29 ENCOUNTER — Encounter: Payer: Self-pay | Admitting: Internal Medicine

## 2011-03-29 VITALS — BP 128/62 | HR 68 | Temp 98.0°F | Ht 65.0 in | Wt 153.0 lb

## 2011-03-29 DIAGNOSIS — E785 Hyperlipidemia, unspecified: Secondary | ICD-10-CM

## 2011-03-29 DIAGNOSIS — M353 Polymyalgia rheumatica: Secondary | ICD-10-CM

## 2011-03-29 DIAGNOSIS — R5381 Other malaise: Secondary | ICD-10-CM

## 2011-03-29 DIAGNOSIS — I10 Essential (primary) hypertension: Secondary | ICD-10-CM

## 2011-03-29 DIAGNOSIS — R5383 Other fatigue: Secondary | ICD-10-CM

## 2011-03-29 LAB — HEPATIC FUNCTION PANEL
Alkaline Phosphatase: 66 U/L (ref 39–117)
Bilirubin, Direct: 0 mg/dL (ref 0.0–0.3)
Total Protein: 6.6 g/dL (ref 6.0–8.3)

## 2011-03-29 LAB — CBC WITH DIFFERENTIAL/PLATELET
Eosinophils Relative: 1.1 % (ref 0.0–5.0)
HCT: 37.1 % (ref 36.0–46.0)
Hemoglobin: 12.7 g/dL (ref 12.0–15.0)
Lymphs Abs: 1.1 10*3/uL (ref 0.7–4.0)
MCV: 92.9 fl (ref 78.0–100.0)
Monocytes Absolute: 0.5 10*3/uL (ref 0.1–1.0)
Neutro Abs: 2.9 10*3/uL (ref 1.4–7.7)
Platelets: 337 10*3/uL (ref 150.0–400.0)
RDW: 12.9 % (ref 11.5–14.6)
WBC: 4.6 10*3/uL (ref 4.5–10.5)

## 2011-03-29 LAB — BASIC METABOLIC PANEL
BUN: 11 mg/dL (ref 6–23)
Chloride: 101 mEq/L (ref 96–112)
Glucose, Bld: 100 mg/dL — ABNORMAL HIGH (ref 70–99)
Potassium: 4.6 mEq/L (ref 3.5–5.1)

## 2011-03-29 LAB — LIPID PANEL: Cholesterol: 198 mg/dL (ref 0–200)

## 2011-03-29 LAB — SEDIMENTATION RATE: Sed Rate: 33 mm/hr — ABNORMAL HIGH (ref 0–22)

## 2011-03-29 NOTE — Progress Notes (Signed)
Subjective:    [name], female    DOB: Jul 28, 1923, 78 y.o.   MRN: [id number]  HPI Was in last week with URI Does feel like she is improving now Still with some cough but slowly improving Plans to [facility]  Does check BP at times Has been okay Has awoken with headaches a couple of times---when she had cold though No chest pain--except sharp pain below left clavicle. Feels better with rubbing No SOB No dizziness or fainting  Feels fatigued---wonders if she needs B12 or iron Is a bit down with sister's decline though  Still takes fish oil for cholesterol Uses DHA also, and green tea at times  Still on omeprazole Heartburn controlled  Current Outpatient Prescriptions on File Prior to Visit  Medication Sig Dispense Refill  . aspirin 81 MG tablet Take 81 mg by mouth daily.        . fish oil-omega-3 fatty acids 1000 MG capsule Take 2 g by mouth daily.        Marland Kitchen losartan-hydrochlorothiazide (HYZAAR) 50-12.5 MG per tablet TAKE ONE TABLET BY MOUTH EVERY DAY  90 tablet  3  . Multiple Vitamin (MULTIVITAMIN) capsule Take 1 capsule by mouth daily.        Marland Kitchen omeprazole (PRILOSEC) 20 MG capsule Take 20 mg by mouth daily.          Allergies  Allergen Reactions  . Alendronate Sodium     REACTION: GI    Past Medical History  Diagnosis Date  . Diverticulosis   . GERD (gastroesophageal reflux disease)   . Hyperlipidemia   . Hypertension   . Osteoporosis   . Polymyalgia rheumatica     Past Surgical History  Procedure Date  . Tonsillectomy   . Ankle fracture surgery 07/12/2005  . Cataract extraction 2012    right    Family History  Problem Relation Age of Onset  . Arthritis Mother   . Heart disease Father 73  . Diabetes Brother   . Cancer Sister     ovarian  . Stroke Sister     History   Social History  . Marital Status: Widowed    Spouse Name: N/A    Number of Children: 2  . Years of Education: N/A   Occupational  History  . House wife, farming    Social History Main Topics  . Smoking status: Never Smoker   . Smokeless tobacco: Never Used  . Alcohol Use: Yes  . Drug Use:   . Sexually Active:    Other Topics Concern  . Not on file   Social History Narrative   Request DNR.  Had two daughters but neither in town.  Feels they should share responsibility of health care POA.  Request no feeding tube.  Does not want extended ventilator support.   Review of Systems Appetite is fine Orange sherbet is her comfort food Weight is stable Sleeps fair---occ brief night awakening Does have daytime urinary frequency---uses pads just in case, but not incontinent     Objective:   Physical Exam  Constitutional: She appears well-developed and well-nourished. No distress.  Neck: Normal range of motion. Neck supple. No thyromegaly present.  Cardiovascular: Normal rate, regular rhythm, normal heart sounds and intact distal pulses.  Exam reveals no gallop.   No murmur heard. Pulmonary/Chest: Effort normal and breath sounds normal. No respiratory distress. She has no wheezes. She has no rales.  Abdominal: Soft. She exhibits no mass.  There is no tenderness.  Musculoskeletal: Normal range of motion. She exhibits no edema and no tenderness.  Lymphadenopathy:    She has no cervical adenopathy.    She has no axillary adenopathy.       Right: No inguinal adenopathy present.       Left: No inguinal adenopathy present.  Skin: No rash noted.  Psychiatric: She has a normal mood and affect. Her behavior is normal. Judgment and thought content normal.       Clearly upset about sister's condition but no overt depression          Assessment & Plan:

## 2011-03-29 NOTE — Assessment & Plan Note (Signed)
BP Readings from Last 3 Encounters:  03/29/11 128/62  03/21/11 140/62  11/05/10 149/68   Good control now No changes needed

## 2011-03-29 NOTE — Assessment & Plan Note (Signed)
Ongoing issues with this Likely related to season and sister's worsening condition (they were very close) Will just check labs Counseled about light therapy, exercise

## 2011-03-29 NOTE — Assessment & Plan Note (Signed)
Lab Results  Component Value Date   LDLCALC 70 05/29/2009   Just on fish oil Statin not really indicated given her age and lack of vascular Rx

## 2011-03-29 NOTE — Assessment & Plan Note (Signed)
Doesn't seem to be exacerbated Will check labs though

## 2011-06-08 ENCOUNTER — Telehealth: Payer: Self-pay | Admitting: Internal Medicine

## 2011-06-08 NOTE — Telephone Encounter (Signed)
She can call Morrie Sheldon or Angelica Chessman and see if they will do that for her If not, she can set up appt here

## 2011-06-08 NOTE — Telephone Encounter (Signed)
Left message on VM with results 

## 2011-06-08 NOTE — Telephone Encounter (Signed)
Pt is calling about having her ears cleaned out. She is a pt at [facility] and was wondering if this can be done at [facility].

## 2011-09-29 ENCOUNTER — Encounter: Payer: Self-pay | Admitting: Family Medicine

## 2011-09-29 ENCOUNTER — Ambulatory Visit (INDEPENDENT_AMBULATORY_CARE_PROVIDER_SITE_OTHER): Payer: Medicare Other | Admitting: Family Medicine

## 2011-09-29 VITALS — BP 152/82 | HR 76 | Temp 97.9°F | Wt 155.0 lb

## 2011-09-29 DIAGNOSIS — N3941 Urge incontinence: Secondary | ICD-10-CM

## 2011-09-29 MED ORDER — TOLTERODINE TARTRATE 1 MG PO TABS: 1.0000 mg | ORAL_TABLET | Freq: Two times a day (BID) | ORAL | Status: DC

## 2011-09-29 NOTE — Patient Instructions (Addendum)
Good to see you. I have sent a prescription for Detrol (generic) to your pharmacy. Please call us to let us know how you're doing.

## 2011-09-29 NOTE — Progress Notes (Signed)
Subjective:    [name], female    DOB: 08-Sep-1923, 78 y.o.   MRN: [id number]  HPI  78 yo here to discuss "urine leakage."  Has known h/o uterine prolapse.  Went to gyn but opted for no intervention. Over past several years, she has had issues with urge incontinence.  Usually does not have any stress incontinence. She considers her UI to be quite mild- usually she makes it to the bathroom on time. Her grandson is getting married and she is worried that she will have issues during the week of the wedding.  Has never tried any medications for OAB.  No dysuria. No back pain. No fevers or chills.  Current Outpatient Prescriptions on File Prior to Visit  Medication Sig Dispense Refill  . aspirin 81 MG tablet Take 81 mg by mouth daily.        . fish oil-omega-3 fatty acids 1000 MG capsule Take 2 g by mouth daily.        Marland Kitchen losartan-hydrochlorothiazide (HYZAAR) 50-12.5 MG per tablet TAKE ONE TABLET BY MOUTH EVERY DAY  90 tablet  3  . Multiple Vitamin (MULTIVITAMIN) capsule Take 1 capsule by mouth daily.        Marland Kitchen omeprazole (PRILOSEC) 20 MG capsule Take 20 mg by mouth daily.          Allergies  Allergen Reactions  . Alendronate Sodium     REACTION: GI    Past Medical History  Diagnosis Date  . Diverticulosis   . GERD (gastroesophageal reflux disease)   . Hyperlipidemia   . Hypertension   . Osteoporosis   . Polymyalgia rheumatica     Past Surgical History  Procedure Date  . Tonsillectomy   . Ankle fracture surgery 07/12/2005  . Cataract extraction 2012    right    Family History  Problem Relation Age of Onset  . Arthritis Mother   . Heart disease Father 44  . Diabetes Brother   . Cancer Sister     ovarian  . Stroke Sister     History   Social History  . Marital Status: Widowed    Spouse Name: N/A    Number of Children: 2  . Years of Education: N/A   Occupational History  . House wife, farming    Social History Main Topics  . Smoking  status: Never Smoker   . Smokeless tobacco: Never Used  . Alcohol Use: Yes  . Drug Use:   . Sexually Active:    Other Topics Concern  . Not on file   Social History Narrative   Request DNR.  Had two daughters but neither in town.  Feels they should share responsibility of health care POA.  Request no feeding tube.  Does not want extended ventilator support.   Review of Systems  No abdominal pain    Objective:   Physical Exam  BP 152/82  Pulse 76  Temp 97.9 F (36.6 C)  Wt 155 lb (70.308 kg)  Constitutional: She appears well-developed and well-nourished. No distress.  Neck: Normal range of motion. Neck supple. No thyromegaly present.  Cardiovascular: Normal rate, regular rhythm, normal heart sounds and intact distal pulses.  Exam reveals no gallop.   No murmur heard. Pulmonary/Chest: Effort normal and breath sounds normal. No respiratory distress. She has no wheezes. She has no rales.  Abdominal: Soft. She exhibits no mass. There is no tenderness.  Musculoskeletal: Normal range of motion. She exhibits no edema and no tenderness.  Skin: No rash noted.  Psychiatric: She has a normal mood and affect. Her behavior is normal. Judgment and thought content normal.            Assessment & Plan:   1. Urge incontinence    Deteriorated- discussed treatments options.  Most of OAB meds have anticholinergics side effects which I did discuss with her. There are newer, more expensive medications that do not have these side effects. Will try detrol 1 mg twice daily. Follow up with me or Dr. Alphonsus Sias prn. The patient indicates understanding of these issues and agrees with the plan.

## 2011-12-08 ENCOUNTER — Ambulatory Visit (INDEPENDENT_AMBULATORY_CARE_PROVIDER_SITE_OTHER): Payer: Medicare Other | Admitting: Internal Medicine

## 2011-12-08 ENCOUNTER — Encounter: Payer: Self-pay | Admitting: Internal Medicine

## 2011-12-08 ENCOUNTER — Other Ambulatory Visit: Payer: Self-pay | Admitting: Internal Medicine

## 2011-12-08 VITALS — BP 126/70 | HR 73 | Temp 98.2°F | Wt 156.0 lb

## 2011-12-08 DIAGNOSIS — Z23 Encounter for immunization: Secondary | ICD-10-CM

## 2011-12-08 DIAGNOSIS — L989 Disorder of the skin and subcutaneous tissue, unspecified: Secondary | ICD-10-CM

## 2011-12-08 NOTE — Patient Instructions (Addendum)
Please set up an appointment with Dr Purcell Nails to evaluate that skin area. Call if you have any trouble getting an appt

## 2011-12-08 NOTE — Progress Notes (Signed)
  Subjective:    [name], female    DOB: 1924/02/21, 78 y.o.   MRN: [id number]  HPI Has a sore on the back of her left leg She thinks it goes back a year but it now is worse Was evaluated by a dermatologist without any action needed Tender and really hurts now Behind the knee  Has tried neosporin and bandaid No discharge  Current Outpatient Prescriptions on File Prior to Visit  Medication Sig Dispense Refill  . aspirin 81 MG tablet Take 81 mg by mouth daily.        . fish oil-omega-3 fatty acids 1000 MG capsule Take 2 g by mouth daily.        . Multiple Vitamin (MULTIVITAMIN) capsule Take 1 capsule by mouth daily.        Marland Kitchen omeprazole (PRILOSEC) 20 MG capsule Take 20 mg by mouth daily.        Marland Kitchen DISCONTD: losartan-hydrochlorothiazide (HYZAAR) 50-12.5 MG per tablet TAKE ONE TABLET BY MOUTH EVERY DAY  90 tablet  3    Allergies  Allergen Reactions  . Alendronate Sodium     REACTION: GI    Past Medical History  Diagnosis Date  . Diverticulosis   . GERD (gastroesophageal reflux disease)   . Hyperlipidemia   . Hypertension   . Osteoporosis   . Polymyalgia rheumatica     Past Surgical History  Procedure Date  . Tonsillectomy   . Ankle fracture surgery 07/12/2005  . Cataract extraction 2012    right    Family History  Problem Relation Age of Onset  . Arthritis Mother   . Heart disease Father 30  . Diabetes Brother   . Cancer Sister     ovarian  . Stroke Sister     History   Social History  . Marital Status: Widowed    Spouse Name: N/A    Number of Children: 2  . Years of Education: N/A   Occupational History  . House wife, farming    Social History Main Topics  . Smoking status: Never Smoker   . Smokeless tobacco: Never Used  . Alcohol Use: Yes  . Drug Use:   . Sexually Active:    Other Topics Concern  . Not on file   Social History Narrative   Request DNR.  Had two daughters but neither in town.  Feels they should share  responsibility of health care POA.  Request no feeding tube.  Does not want extended ventilator support.   Review of Systems No fever No pain with walking     Objective:   Physical Exam  Constitutional: She appears well-developed and well-nourished. No distress.  Skin:       ~5-7mm tender red nodule just under right popliteal fossa          Assessment & Plan:

## 2011-12-08 NOTE — Assessment & Plan Note (Signed)
This looks like a likely BCC She will go to Dr Purcell Nails for biopsy Discussed trying liquid nitrogen but biopsy probably more appropriate

## 2012-04-25 ENCOUNTER — Ambulatory Visit (INDEPENDENT_AMBULATORY_CARE_PROVIDER_SITE_OTHER): Payer: Medicare PPO | Admitting: Internal Medicine

## 2012-04-25 ENCOUNTER — Encounter: Payer: Medicare Other | Admitting: Internal Medicine

## 2012-04-25 ENCOUNTER — Encounter: Payer: Self-pay | Admitting: Internal Medicine

## 2012-04-25 VITALS — BP 130/80 | HR 71 | Temp 98.2°F | Ht 59.5 in | Wt 156.0 lb

## 2012-04-25 DIAGNOSIS — N3941 Urge incontinence: Secondary | ICD-10-CM

## 2012-04-25 DIAGNOSIS — E785 Hyperlipidemia, unspecified: Secondary | ICD-10-CM

## 2012-04-25 DIAGNOSIS — M353 Polymyalgia rheumatica: Secondary | ICD-10-CM

## 2012-04-25 DIAGNOSIS — Z0000 Encounter for general adult medical examination without abnormal findings: Secondary | ICD-10-CM | POA: Insufficient documentation

## 2012-04-25 DIAGNOSIS — K219 Gastro-esophageal reflux disease without esophagitis: Secondary | ICD-10-CM

## 2012-04-25 DIAGNOSIS — Z4689 Encounter for fitting and adjustment of other specified devices: Secondary | ICD-10-CM | POA: Insufficient documentation

## 2012-04-25 DIAGNOSIS — I10 Essential (primary) hypertension: Secondary | ICD-10-CM

## 2012-04-25 LAB — HEPATIC FUNCTION PANEL
ALT: 21 U/L (ref 0–35)
AST: 30 U/L (ref 0–37)
Albumin: 3.9 g/dL (ref 3.5–5.2)
Alkaline Phosphatase: 58 U/L (ref 39–117)
Total Protein: 6.7 g/dL (ref 6.0–8.3)

## 2012-04-25 LAB — BASIC METABOLIC PANEL
CO2: 26 mEq/L (ref 19–32)
Calcium: 9.5 mg/dL (ref 8.4–10.5)
GFR: 68.29 mL/min (ref >60.00)
Sodium: 136 mEq/L (ref 135–145)

## 2012-04-25 LAB — CBC WITH DIFFERENTIAL/PLATELET
Basophils Relative: 0.6 % (ref 0.0–3.0)
Hemoglobin: 12.2 g/dL (ref 12.0–15.0)
Lymphocytes Relative: 19.9 % (ref 12.0–46.0)
Monocytes Relative: 10.7 % (ref 3.0–12.0)
Neutro Abs: 3.1 10*3/uL (ref 1.4–7.7)
RBC: 4.09 Mil/uL (ref 3.87–5.11)

## 2012-04-25 LAB — LIPID PANEL
HDL: 83.3 mg/dL (ref >39.00)
Total CHOL/HDL Ratio: 3
Triglycerides: 63 mg/dL (ref 0.0–149.0)

## 2012-04-25 LAB — LDL CHOLESTEROL, DIRECT: Direct LDL: 116.2 mg/dL

## 2012-04-25 NOTE — Assessment & Plan Note (Signed)
Does okay on the med 

## 2012-04-25 NOTE — Assessment & Plan Note (Signed)
BP Readings from Last 3 Encounters:  04/25/12 130/80  12/08/11 126/70  09/29/11 152/82   Good control  No changes needed Due for labs

## 2012-04-25 NOTE — Assessment & Plan Note (Signed)
May be related to prolapse Will refer to gyn for eval if worsens

## 2012-04-25 NOTE — Progress Notes (Signed)
Subjective:    [name], female    DOB: 1923/09/28, 79 y.o.   MRN: [id number]  HPI Here for physical Reviewed advanced directives Does water aerobics Did see Dr Beverly Sessions lesion was not malignant Continues to travel--has a lot planned  Still curious about her cholesterol No meds for this  Still with prolapsed uterus Ongoing trouble with bladder control--uses a pad No help with Rx for this Doesn't want pessary at this point  No muscle soreness No weakness No fatigue  No chest pain No SOB No dizziness or syncope  Current Outpatient Prescriptions on File Prior to Visit  Medication Sig Dispense Refill  . aspirin 81 MG tablet Take 81 mg by mouth daily.        . fish oil-omega-3 fatty acids 1000 MG capsule Take 2 g by mouth daily.        Marland Kitchen losartan-hydrochlorothiazide (HYZAAR) 50-12.5 MG per tablet TAKE 1 TABLET EVERY DAY  90 tablet  2  . Multiple Vitamin (MULTIVITAMIN) capsule Take 1 capsule by mouth daily.        Marland Kitchen omeprazole (PRILOSEC) 20 MG capsule Take 20 mg by mouth daily.         No current facility-administered medications on file prior to visit.    Allergies  Allergen Reactions  . Alendronate Sodium     REACTION: GI    Past Medical History  Diagnosis Date  . Diverticulosis   . GERD (gastroesophageal reflux disease)   . Hyperlipidemia   . Hypertension   . Osteoporosis   . Polymyalgia rheumatica     Past Surgical History  Procedure Laterality Date  . Tonsillectomy    . Ankle fracture surgery  07/12/2005  . Cataract extraction  2012    right    Family History  Problem Relation Age of Onset  . Arthritis Mother   . Heart disease Father 35  . Diabetes Brother   . Cancer Sister     ovarian  . Stroke Sister     History   Social History  . Marital Status: Widowed    Spouse Name: N/A    Number of Children: 2  . Years of Education: N/A   Occupational History  . House wife, farming    Social History Main Topics  .  Smoking status: Never Smoker   . Smokeless tobacco: Never Used  . Alcohol Use: Yes  . Drug Use:   . Sexually Active:    Other Topics Concern  . Not on file   Social History Narrative   Request DNR.     Had two daughters but neither in town.  Feels they should share responsibility of health care POA.  Request no feeding tube.  Does not want extended ventilator support.   Review of Systems  Constitutional: Negative for fatigue and unexpected weight change.       Wears seat belt  HENT: Positive for hearing loss and sneezing. Negative for dental problem and tinnitus.        Has hearing aides but doesn't wear them--don't help much Regular with dentist  Eyes: Negative for visual disturbance.       No diplopia or unilateral vision loss  Respiratory: Negative for cough, chest tightness and shortness of breath.   Cardiovascular: Negative for chest pain, palpitations and leg swelling.  Gastrointestinal: Negative for nausea, vomiting, abdominal pain, constipation and blood in stool.       Heartburn controlled by the med  Endocrine: Negative for cold intolerance and  heat intolerance.  Genitourinary: Positive for urgency and difficulty urinating. Negative for dysuria.       Mild incontinence issues  Musculoskeletal: Positive for arthralgias. Negative for back pain and joint swelling.       Mild pain Uses aleve at times  Skin: Negative for rash.       No new skin lesions  Allergic/Immunologic: Negative for environmental allergies.  Neurological: Negative for dizziness, syncope, weakness, light-headedness, numbness and headaches.  Psychiatric/Behavioral: Negative for sleep disturbance and dysphoric mood. The patient is not nervous/anxious.        Objective:   Physical Exam  Constitutional: She is oriented to person, place, and time. She appears well-developed and well-nourished. No distress.  HENT:  Head: Normocephalic and atraumatic.  Right Ear: External ear normal.  Left Ear: External  ear normal.  Mouth/Throat: Oropharynx is clear and moist. No oropharyngeal exudate.  Eyes: Conjunctivae and EOM are normal. Pupils are equal, round, and reactive to light.  Neck: Normal range of motion. Neck supple. No thyromegaly present.  Cardiovascular: Normal rate, regular rhythm, normal heart sounds and intact distal pulses.  Exam reveals no gallop.   No murmur heard. Pulmonary/Chest: Effort normal and breath sounds normal. No respiratory distress. She has no wheezes. She has no rales.  Abdominal: Soft. There is no tenderness.  Musculoskeletal: She exhibits no edema and no tenderness.  Lymphadenopathy:    She has no cervical adenopathy.  Neurological: She is alert and oriented to person, place, and time.  Skin: No rash noted. No erythema.  Psychiatric: She has a normal mood and affect. Her behavior is normal.          Assessment & Plan:

## 2012-04-25 NOTE — Assessment & Plan Note (Signed)
No active symptoms Check ESR

## 2012-04-25 NOTE — Assessment & Plan Note (Signed)
Low risk profile Rx not indicated Will recheck though

## 2012-04-25 NOTE — Assessment & Plan Note (Signed)
Fairly healthy Remains active and independent No apparent cognitive changes UTD with imms Cancer screening not indicated

## 2012-04-30 ENCOUNTER — Encounter: Payer: Self-pay | Admitting: *Deleted

## 2012-07-30 ENCOUNTER — Telehealth: Payer: Self-pay

## 2012-07-30 NOTE — Telephone Encounter (Signed)
Pt said had bone density test at least 10 years ago. Pt has started Yoga class at [facility]; pt said Yoga hurts her arms and pt wants to know if she should have a bone density test; and pt is considering stopping Yoga.Please advise.

## 2012-07-30 NOTE — Telephone Encounter (Signed)
Low bone density would not cause the pain she is describing. That is probably muscular. She should probably stick with the class but may need to do less than the other people until she works her body into better shape We can discuss her bone density and whether test appropriate at her next appt

## 2012-07-31 NOTE — Telephone Encounter (Signed)
Pt advised, she will continue with yoga, but will take it easy.

## 2012-08-29 ENCOUNTER — Other Ambulatory Visit: Payer: Self-pay | Admitting: Internal Medicine

## 2012-12-11 ENCOUNTER — Ambulatory Visit (INDEPENDENT_AMBULATORY_CARE_PROVIDER_SITE_OTHER): Payer: Medicare PPO

## 2012-12-11 DIAGNOSIS — Z23 Encounter for immunization: Secondary | ICD-10-CM

## 2013-04-26 ENCOUNTER — Ambulatory Visit (INDEPENDENT_AMBULATORY_CARE_PROVIDER_SITE_OTHER): Payer: Medicare PPO | Admitting: Internal Medicine

## 2013-04-26 ENCOUNTER — Encounter: Payer: Self-pay | Admitting: Internal Medicine

## 2013-04-26 VITALS — BP 122/70 | HR 70 | Temp 98.0°F | Ht 59.0 in | Wt 158.0 lb

## 2013-04-26 DIAGNOSIS — I10 Essential (primary) hypertension: Secondary | ICD-10-CM

## 2013-04-26 DIAGNOSIS — E785 Hyperlipidemia, unspecified: Secondary | ICD-10-CM

## 2013-04-26 DIAGNOSIS — Z0000 Encounter for general adult medical examination without abnormal findings: Secondary | ICD-10-CM

## 2013-04-26 DIAGNOSIS — K219 Gastro-esophageal reflux disease without esophagitis: Secondary | ICD-10-CM

## 2013-04-26 DIAGNOSIS — M353 Polymyalgia rheumatica: Secondary | ICD-10-CM

## 2013-04-26 LAB — COMPREHENSIVE METABOLIC PANEL
ALT: 21 U/L (ref 0–35)
AST: 27 U/L (ref 0–37)
Albumin: 3.8 g/dL (ref 3.5–5.2)
Alkaline Phosphatase: 63 U/L (ref 39–117)
BILIRUBIN TOTAL: 0.6 mg/dL (ref 0.3–1.2)
BUN: 15 mg/dL (ref 6–23)
CHLORIDE: 107 meq/L (ref 96–112)
CO2: 26 meq/L (ref 19–32)
Calcium: 9.7 mg/dL (ref 8.4–10.5)
Creatinine, Ser: 0.8 mg/dL (ref 0.4–1.2)
GFR: 68.13 mL/min (ref >60.00)
Glucose, Bld: 89 mg/dL (ref 70–99)
Potassium: 4.7 mEq/L (ref 3.5–5.1)
SODIUM: 141 meq/L (ref 135–145)
TOTAL PROTEIN: 6.5 g/dL (ref 6.0–8.3)

## 2013-04-26 LAB — T4, FREE: FREE T4: 1.1 ng/dL (ref 0.60–1.60)

## 2013-04-26 LAB — SEDIMENTATION RATE: Sed Rate: 17 mm/hr (ref 0–22)

## 2013-04-26 LAB — CBC WITH DIFFERENTIAL/PLATELET
BASOS ABS: 0 10*3/uL (ref 0.0–0.1)
Basophils Relative: 0.4 % (ref 0.0–3.0)
EOS ABS: 0.1 10*3/uL (ref 0.0–0.7)
Eosinophils Relative: 1.4 % (ref 0.0–5.0)
HEMATOCRIT: 37.8 % (ref 36.0–46.0)
Hemoglobin: 12.1 g/dL (ref 12.0–15.0)
LYMPHS ABS: 1.1 10*3/uL (ref 0.7–4.0)
Lymphocytes Relative: 21.6 % (ref 12.0–46.0)
MCHC: 32.2 g/dL (ref 30.0–36.0)
MCV: 94.6 fl (ref 78.0–100.0)
MONO ABS: 0.5 10*3/uL (ref 0.1–1.0)
MONOS PCT: 9.3 % (ref 3.0–12.0)
Neutro Abs: 3.5 10*3/uL (ref 1.4–7.7)
Neutrophils Relative %: 67.3 % (ref 43.0–77.0)
PLATELETS: 216 10*3/uL (ref 150.0–400.0)
RBC: 3.99 Mil/uL (ref 3.87–5.11)
RDW: 12.9 % (ref 11.5–14.6)
WBC: 5.3 10*3/uL (ref 4.5–10.5)

## 2013-04-26 LAB — TSH: TSH: 1.14 u[IU]/mL (ref 0.35–5.50)

## 2013-04-26 NOTE — Progress Notes (Signed)
Subjective:    [name], female    DOB: 08/11/1923, 80 y.o.   MRN: [id number]  HPI Here for physical Doing well Stays active Big social structure in church and some at [facility] [employer] Reviewed advanced directives Exercises regularly with water aerobics  Still on BP med No problems with this Still takes aspirin--but forgets Okay to use every other day and discussed the conflicting evidence  Current Outpatient Prescriptions on File Prior to Visit  Medication Sig Dispense Refill  . aspirin 81 MG tablet Take 81 mg by mouth daily.        . fish oil-omega-3 fatty acids 1000 MG capsule Take 2 g by mouth daily.        Marland Kitchen losartan-hydrochlorothiazide (HYZAAR) 50-12.5 MG per tablet TAKE 1 TABLET EVERY DAY  90 tablet  3  . Melatonin 5 MG CAPS Take 1 capsule by mouth daily as needed.      . Multiple Vitamin (MULTIVITAMIN) capsule Take 1 capsule by mouth daily.        Marland Kitchen omeprazole (PRILOSEC) 20 MG capsule Take 20 mg by mouth daily.         No current facility-administered medications on file prior to visit.    Allergies  Allergen Reactions  . Alendronate Sodium     REACTION: GI    Past Medical History  Diagnosis Date  . Diverticulosis   . GERD (gastroesophageal reflux disease)   . Hyperlipidemia   . Hypertension   . Osteoporosis   . Polymyalgia rheumatica     Past Surgical History  Procedure Laterality Date  . Tonsillectomy    . Ankle fracture surgery  07/12/2005  . Cataract extraction  2012    right    Family History  Problem Relation Age of Onset  . Arthritis Mother   . Heart disease Father 81  . Diabetes Brother   . Cancer Sister     ovarian  . Stroke Sister     History   Social History  . Marital Status: Widowed    Spouse Name: N/A    Number of Children: 2  . Years of Education: N/A   Occupational History  . House wife, farming    Social History Main Topics  . Smoking status: Never Smoker   . Smokeless tobacco: Never Used  . Alcohol  Use: Yes  . Drug Use:   . Sexual Activity:    Other Topics Concern  . Not on file   Social History Narrative   Request DNR.   Had two daughters but neither in town.  Feels they should share responsibility of health care POA.  Request no feeding tube.  Does not want extended ventilator support.   Review of Systems  Constitutional: Negative for fatigue and unexpected weight change.       Wears seat belt  HENT: Positive for hearing loss. Negative for dental problem and tinnitus.        Has hearing aides--doesn't wear all the time Regular with dentist  Eyes: Negative for visual disturbance.       No diplopia or unilateral vision loss  Respiratory: Negative for cough, chest tightness and shortness of breath.   Cardiovascular: Negative for chest pain, palpitations and leg swelling.  Gastrointestinal: Negative for nausea, vomiting, abdominal pain, constipation and blood in stool.       Heartburn controlled by the omeprazole  Endocrine: Negative for cold intolerance and heat intolerance.  Genitourinary: Positive for difficulty urinating. Negative for dysuria and hematuria.  Still with prolapse---- gets urgency and wears pads  Musculoskeletal: Negative for arthralgias, back pain and joint swelling.       Some decreased hand strength but no sig pain  Skin: Negative for rash.       Lots of moles--keeps up with the dermatologist  Allergic/Immunologic: Negative for environmental allergies and immunocompromised state.  Neurological: Negative for dizziness, syncope, weakness, light-headedness, numbness and headaches.  Hematological: Negative for adenopathy. Bruises/bleeds easily.  Psychiatric/Behavioral: Negative for sleep disturbance and dysphoric mood. The patient is not nervous/anxious.        Will occasionally take melatonin       Objective:   Physical Exam  Constitutional: She is oriented to person, place, and time. She appears well-developed and well-nourished. No distress.    HENT:  Head: Normocephalic and atraumatic.  Right Ear: External ear normal.  Left Ear: External ear normal.  Mouth/Throat: Oropharynx is clear and moist. No oropharyngeal exudate.  Eyes: Conjunctivae and EOM are normal. Pupils are equal, round, and reactive to light.  Neck: Normal range of motion. Neck supple. No thyromegaly present.  Cardiovascular: Normal rate, regular rhythm, normal heart sounds and intact distal pulses.  Exam reveals no gallop.   No murmur heard. Pulmonary/Chest: Effort normal and breath sounds normal. No respiratory distress. She has no wheezes. She has no rales.  Abdominal: Soft. There is no tenderness.  Musculoskeletal: She exhibits no edema and no tenderness.  Lymphadenopathy:    She has no cervical adenopathy.  Neurological: She is alert and oriented to person, place, and time.  Skin: No rash noted. No erythema.  Psychiatric: She has a normal mood and affect. Her behavior is normal.          Assessment & Plan:

## 2013-04-26 NOTE — Assessment & Plan Note (Signed)
Controlled with PPI

## 2013-04-26 NOTE — Assessment & Plan Note (Signed)
Will not use statin for primary prevention at her age

## 2013-04-26 NOTE — Assessment & Plan Note (Signed)
Has been in remission for some years

## 2013-04-26 NOTE — Assessment & Plan Note (Signed)
BP Readings from Last 3 Encounters:  04/26/13 122/70  04/25/12 130/80  12/08/11 126/70   Good control No changes needed

## 2013-04-26 NOTE — Assessment & Plan Note (Signed)
Healthy Counseling done Discussed tetanus booster---she prefers to hold off on this No cancer screening due to age

## 2013-04-26 NOTE — Progress Notes (Signed)
Pre-visit discussion using our clinic review tool. No additional management support is needed unless otherwise documented below in the visit note.  

## 2013-04-29 ENCOUNTER — Telehealth: Payer: Self-pay | Admitting: Internal Medicine

## 2013-04-29 ENCOUNTER — Encounter: Payer: Self-pay | Admitting: *Deleted

## 2013-04-29 NOTE — Telephone Encounter (Signed)
Relevant patient education mailed to patient.  

## 2013-08-22 ENCOUNTER — Other Ambulatory Visit: Payer: Self-pay | Admitting: Internal Medicine

## 2013-12-09 ENCOUNTER — Ambulatory Visit (INDEPENDENT_AMBULATORY_CARE_PROVIDER_SITE_OTHER)
Admission: RE | Admit: 2013-12-09 | Discharge: 2013-12-09 | Disposition: A | Discharge status: Home / Self Care | Payer: Medicare PPO | Source: Ambulatory Visit | Attending: Internal Medicine | Admitting: Internal Medicine

## 2013-12-09 ENCOUNTER — Encounter: Payer: Self-pay | Admitting: Internal Medicine

## 2013-12-09 ENCOUNTER — Ambulatory Visit (INDEPENDENT_AMBULATORY_CARE_PROVIDER_SITE_OTHER): Payer: Medicare PPO | Admitting: Internal Medicine

## 2013-12-09 VITALS — BP 138/74 | HR 70 | Temp 98.1°F | Ht 59.0 in | Wt 156.5 lb

## 2013-12-09 DIAGNOSIS — M25561 Pain in right knee: Secondary | ICD-10-CM

## 2013-12-09 DIAGNOSIS — M25569 Pain in unspecified knee: Secondary | ICD-10-CM

## 2013-12-09 DIAGNOSIS — M159 Polyosteoarthritis, unspecified: Secondary | ICD-10-CM | POA: Insufficient documentation

## 2013-12-09 NOTE — Progress Notes (Signed)
Subjective:    [name], female    DOB: 1923-03-16, 80 y.o.   MRN: [id number]  HPI Having right leg and knee pain for a couple of weeks or even a month Noticed it first when doing water aerobics with substitute-- twisted it some Noticed pain the next day and it "slowed me down" Pain mostly along the medial lower part of knee  Aleve will help---but will hurt when straightening it out in bed Taking 2 aleve in AM and 1 at night Tylenol not as helpful  Tried a couple of elastic braces---can't wait to get them off Doesn't seem to swell  Current Outpatient Prescriptions on File Prior to Visit  Medication Sig Dispense Refill  . aspirin 81 MG tablet Take 81 mg by mouth daily.        . cholecalciferol (VITAMIN D) 1000 UNITS tablet Take 1,000 Units by mouth daily.      Marland Kitchen losartan-hydrochlorothiazide (HYZAAR) 50-12.5 MG per tablet TAKE 1 TABLET EVERY DAY  90 tablet  3  . Multiple Vitamin (MULTIVITAMIN) capsule Take 1 capsule by mouth daily.        Marland Kitchen omeprazole (PRILOSEC) 20 MG capsule Take 20 mg by mouth daily.        . fish oil-omega-3 fatty acids 1000 MG capsule Take 2 g by mouth daily.        . Melatonin 5 MG CAPS Take 1 capsule by mouth daily as needed.       No current facility-administered medications on file prior to visit.    Allergies  Allergen Reactions  . Alendronate Sodium     REACTION: GI    Past Medical History  Diagnosis Date  . Diverticulosis   . GERD (gastroesophageal reflux disease)   . Hyperlipidemia   . Hypertension   . Osteoporosis   . Polymyalgia rheumatica     Past Surgical History  Procedure Laterality Date  . Tonsillectomy    . Ankle fracture surgery  07/12/2005  . Cataract extraction  2012    right    Family History  Problem Relation Age of Onset  . Arthritis Mother   . Heart disease Father 71  . Diabetes Brother   . Cancer Sister     ovarian  . Stroke Sister     History   Social History  . Marital Status: Widowed      Spouse Name: N/A    Number of Children: 2  . Years of Education: N/A   Occupational History  . House wife, farming    Social History Main Topics  . Smoking status: Never Smoker   . Smokeless tobacco: Never Used  . Alcohol Use: Yes  . Drug Use:   . Sexual Activity:    Other Topics Concern  . Not on file   Social History Narrative   Request DNR.   Had two daughters but neither in town.  Feels they should share responsibility of health care POA.  Request no feeding tube.  Does not want extended ventilator support.   Review of Systems Mild stiffness in hands--- but no major arthritis anywhere else No prior problems with her knees No fever--doesn't feel sick    Objective:   Physical Exam  Constitutional: She appears well-developed and well-nourished. No distress.  Musculoskeletal:  Thick knees--seems mostly soft tissue above the patellas No apparent right knee effusion No meniscus findings or clear ligament instability Some pain with full flexion  Assessment & Plan:

## 2013-12-09 NOTE — Patient Instructions (Signed)
Call for an appointment to get a cortisone shot if not better in 2 weeks or so.

## 2013-12-09 NOTE — Assessment & Plan Note (Signed)
Doesn't appear to have any internal derangement Certainly seems to have underlying arthritis ---but probably just a sprain on top of that Okay to continue the aleve for now Ice after activity No brace since they don't feel good Should walk in pool and keep muscles strong Consider cortisone shot in a couple of weeks if not better Check x-ray

## 2013-12-09 NOTE — Progress Notes (Signed)
Pre visit review using our clinic review tool, if applicable. No additional management support is needed unless otherwise documented below in the visit note. 

## 2013-12-30 ENCOUNTER — Telehealth: Payer: Self-pay | Admitting: Internal Medicine

## 2013-12-30 NOTE — Telephone Encounter (Signed)
Scheduled apptmt 12/31/2013

## 2013-12-30 NOTE — Telephone Encounter (Signed)
Right knee still bothering her, continuing to get worse.  Pt thinking about an injection, but did not want to make an appointment until she hears back from Dr. Silvio Pate.  Best number to call pt is [phone].

## 2013-12-30 NOTE — Telephone Encounter (Signed)
If the pain is no better, I definitely recommend a cortisone shot

## 2013-12-31 ENCOUNTER — Ambulatory Visit (INDEPENDENT_AMBULATORY_CARE_PROVIDER_SITE_OTHER): Payer: Medicare PPO | Admitting: Internal Medicine

## 2013-12-31 ENCOUNTER — Encounter: Payer: Self-pay | Admitting: Internal Medicine

## 2013-12-31 VITALS — BP 150/80 | HR 85 | Temp 98.0°F | Wt 164.0 lb

## 2013-12-31 DIAGNOSIS — M25561 Pain in right knee: Secondary | ICD-10-CM

## 2013-12-31 MED ORDER — METHYLPREDNISOLONE ACETATE 40 MG/ML IJ SUSP
40.0000 mg | Freq: Once | INTRAMUSCULAR | Status: AC
  Administered 2013-12-31: 40 mg via INTRAMUSCULAR

## 2013-12-31 NOTE — Assessment & Plan Note (Signed)
PROCEDURE Sterile prep to medial right knee Ethyl chloride then 2cc 2% plain lidocaine No effusion found 40mg  depomedrol and 7cc 2% lido instilled without difficulty  Tolerated well Discussed home care

## 2013-12-31 NOTE — Progress Notes (Signed)
   Subjective:    [name], female    DOB: [birth year], 80 y.o.   MRN: [id number]  HPI  Here for cortisone shot in right knee  Review of Systems     Objective:   Physical Exam        Assessment & Plan:

## 2013-12-31 NOTE — Progress Notes (Signed)
Pre visit review using our clinic review tool, if applicable. No additional management support is needed unless otherwise documented below in the visit note. 

## 2013-12-31 NOTE — Addendum Note (Signed)
Addended by: Despina Hidden on: 12/31/2013 02:09 PM   Modules accepted: Orders

## 2014-03-17 ENCOUNTER — Ambulatory Visit (INDEPENDENT_AMBULATORY_CARE_PROVIDER_SITE_OTHER): Payer: Commercial Managed Care - HMO | Admitting: Family Medicine

## 2014-03-17 ENCOUNTER — Encounter: Payer: Self-pay | Admitting: Family Medicine

## 2014-03-17 VITALS — BP 126/76 | HR 80 | Temp 98.6°F | Ht 59.0 in | Wt 160.5 lb

## 2014-03-17 DIAGNOSIS — J209 Acute bronchitis, unspecified: Secondary | ICD-10-CM | POA: Insufficient documentation

## 2014-03-17 MED ORDER — AZITHROMYCIN 250 MG PO TABS: ORAL_TABLET | ORAL | Status: DC

## 2014-03-17 MED ORDER — BENZONATATE 200 MG PO CAPS: 200.0000 mg | ORAL_CAPSULE | Freq: Three times a day (TID) | ORAL | Status: DC | PRN

## 2014-03-17 NOTE — Progress Notes (Signed)
Pre visit review using our clinic review tool, if applicable. No additional management support is needed unless otherwise documented below in the visit note. 

## 2014-03-17 NOTE — Patient Instructions (Signed)
Drink lot fluids and rest  Take robitussin as needed  Also tessalon for cough - swallow the pills whole (do not chew) Take zpak as directed  Update if not starting to improve in a week or if worsening

## 2014-03-17 NOTE — Progress Notes (Signed)
Subjective:    [name], female    DOB: 1923/10/11, 81 y.o.   MRN: [id number]  HPI Here for cough since xmas   Comes and goes - with laryngitis  Thought she was getting better and then became worse  Is staying home for the most part-tired  Some phlegm-not a lot -not a lot of color to it  Nasal congestion at night Some runny nose and sneezing  No ST Voice is back  No ear pain  No fever  Takes robitussin and home remedies  Has a hx of gerd- but symptoms are stable on her prilosec   No wheeze or sob   Patient Active Problem List   Diagnosis Date Noted  . Right knee pain 12/09/2013  . Routine general medical examination at a health care facility 04/25/2012  . Urge incontinence 09/29/2011  . ACTINIC KERATOSIS 10/30/2007  . HYPERLIPIDEMIA 10/10/2006  . HYPERTENSION 10/10/2006  . GERD 10/10/2006  . DIVERTICULOSIS, COLON 10/10/2006  . POLYMYALGIA RHEUMATICA 10/10/2006  . OSTEOPOROSIS 10/10/2006   Past Medical History  Diagnosis Date  . Diverticulosis   . GERD (gastroesophageal reflux disease)   . Hyperlipidemia   . Hypertension   . Osteoporosis   . Polymyalgia rheumatica    Past Surgical History  Procedure Laterality Date  . Tonsillectomy    . Ankle fracture surgery  07/12/2005  . Cataract extraction  2012    right   History  Substance Use Topics  . Smoking status: Never Smoker   . Smokeless tobacco: Never Used  . Alcohol Use: Yes   Family History  Problem Relation Age of Onset  . Arthritis Mother   . Heart disease Father 29  . Diabetes Brother   . Cancer Sister     ovarian  . Stroke Sister    Allergies  Allergen Reactions  . Alendronate Sodium     REACTION: GI   Current Outpatient Prescriptions on File Prior to Visit  Medication Sig Dispense Refill  . aspirin 81 MG tablet Take 81 mg by mouth daily.      . cholecalciferol (VITAMIN D) 1000 UNITS tablet Take 1,000 Units by mouth daily.    . fish oil-omega-3 fatty acids 1000 MG  capsule Take 2 g by mouth daily.      Marland Kitchen losartan-hydrochlorothiazide (HYZAAR) 50-12.5 MG per tablet TAKE 1 TABLET EVERY DAY 90 tablet 3  . Melatonin 5 MG CAPS Take 1 capsule by mouth daily as needed.    . Multiple Vitamin (MULTIVITAMIN) capsule Take 1 capsule by mouth daily.      Marland Kitchen omeprazole (PRILOSEC) 20 MG capsule Take 20 mg by mouth daily.      Marland Kitchen OVER THE COUNTER MEDICATION protagen  OTC 1 capsule daily     No current facility-administered medications on file prior to visit.        Review of Systems Review of Systems  Constitutional: Negative for fever, appetite change,  and unexpected weight change.  ENt pos for some cong and rhinorrhea/ no sinus pain  Eyes: Negative for pain and visual disturbance.  Respiratory: Negative for wheeze and shortness of breath.   Cardiovascular: Negative for cp or palpitations    Gastrointestinal: Negative for nausea, diarrhea and constipation.  Genitourinary: Negative for urgency and frequency.  Skin: Negative for pallor or rash   Neurological: Negative for weakness, light-headedness, numbness and headaches.  Hematological: Negative for adenopathy. Does not bruise/bleed easily.  Psychiatric/Behavioral: Negative for dysphoric mood. The patient is not nervous/anxious.  Objective:   Physical Exam  Constitutional: She appears well-developed and well-nourished. No distress.  overwt and well app  HENT:  Head: Normocephalic and atraumatic.  Right Ear: External ear normal.  Left Ear: External ear normal.  Mouth/Throat: No oropharyngeal exudate.  Scant cerumen Nares are boggy Some clear rhinorrhea No facial tenderness  Eyes: Conjunctivae and EOM are normal. Pupils are equal, round, and reactive to light. Right eye exhibits no discharge. Left eye exhibits no discharge.  Neck: Normal range of motion. Neck supple.  Cardiovascular: Normal rate and regular rhythm.   Pulmonary/Chest: Effort normal and breath sounds normal. No respiratory  distress. She has no wheezes. She has no rales. She exhibits no tenderness.  Few isolated rhonchi Good air exch   Lymphadenopathy:    She has no cervical adenopathy.  Neurological: She is alert.  Skin: Skin is warm and dry. No rash noted. No erythema. No pallor.  Psychiatric: She has a normal mood and affect.          Assessment & Plan:   Problem List Items Addressed This Visit      Respiratory   Acute bronchitis - Primary    1 mo s/p uri with cyclic cough  Cover with zpak given duration of illness Tessalon 200 mg tid prn  Disc symptomatic care - see instructions on AVS Update if not starting to improve in a week or if worsening

## 2014-03-17 NOTE — Assessment & Plan Note (Signed)
1 mo s/p uri with cyclic cough  Cover with zpak given duration of illness Tessalon 200 mg tid prn  Disc symptomatic care - see instructions on AVS Update if not starting to improve in a week or if worsening

## 2014-03-21 ENCOUNTER — Other Ambulatory Visit: Payer: Self-pay | Admitting: *Deleted

## 2014-03-21 MED ORDER — LOSARTAN POTASSIUM-HCTZ 50-12.5 MG PO TABS: 1.0000 | ORAL_TABLET | Freq: Every day | ORAL | Status: DC

## 2014-04-29 ENCOUNTER — Encounter: Payer: Self-pay | Admitting: Internal Medicine

## 2014-04-29 ENCOUNTER — Ambulatory Visit (INDEPENDENT_AMBULATORY_CARE_PROVIDER_SITE_OTHER): Payer: Commercial Managed Care - HMO | Admitting: Internal Medicine

## 2014-04-29 VITALS — BP 122/76 | HR 72 | Temp 97.4°F | Ht <= 58 in | Wt 160.8 lb

## 2014-04-29 DIAGNOSIS — M353 Polymyalgia rheumatica: Secondary | ICD-10-CM | POA: Diagnosis not present

## 2014-04-29 DIAGNOSIS — M810 Age-related osteoporosis without current pathological fracture: Secondary | ICD-10-CM | POA: Diagnosis not present

## 2014-04-29 DIAGNOSIS — M179 Osteoarthritis of knee, unspecified: Secondary | ICD-10-CM

## 2014-04-29 DIAGNOSIS — I10 Essential (primary) hypertension: Secondary | ICD-10-CM

## 2014-04-29 DIAGNOSIS — K219 Gastro-esophageal reflux disease without esophagitis: Secondary | ICD-10-CM | POA: Diagnosis not present

## 2014-04-29 DIAGNOSIS — Z23 Encounter for immunization: Secondary | ICD-10-CM

## 2014-04-29 DIAGNOSIS — Z7189 Other specified counseling: Secondary | ICD-10-CM | POA: Diagnosis not present

## 2014-04-29 DIAGNOSIS — Z0000 Encounter for general adult medical examination without abnormal findings: Secondary | ICD-10-CM

## 2014-04-29 DIAGNOSIS — M1711 Unilateral primary osteoarthritis, right knee: Secondary | ICD-10-CM

## 2014-04-29 LAB — T4, FREE: FREE T4: 1.05 ng/dL (ref 0.60–1.60)

## 2014-04-29 LAB — CBC WITH DIFFERENTIAL/PLATELET
Basophils Absolute: 0 10*3/uL (ref 0.0–0.1)
Basophils Relative: 0.4 % (ref 0.0–3.0)
Eosinophils Absolute: 0 10*3/uL (ref 0.0–0.7)
Eosinophils Relative: 1 % (ref 0.0–5.0)
HCT: 35.2 % — ABNORMAL LOW (ref 36.0–46.0)
Hemoglobin: 11.8 g/dL — ABNORMAL LOW (ref 12.0–15.0)
Lymphocytes Relative: 27.4 % (ref 12.0–46.0)
Lymphs Abs: 1.2 10*3/uL (ref 0.7–4.0)
MCHC: 33.6 g/dL (ref 30.0–36.0)
MCV: 85 fl (ref 78.0–100.0)
Monocytes Absolute: 0.5 10*3/uL (ref 0.1–1.0)
Monocytes Relative: 11.1 % (ref 3.0–12.0)
Neutro Abs: 2.6 10*3/uL (ref 1.4–7.7)
Neutrophils Relative %: 60.1 % (ref 43.0–77.0)
Platelets: 226 10*3/uL (ref 150.0–400.0)
RBC: 4.14 Mil/uL (ref 3.87–5.11)
RDW: 14.1 % (ref 11.5–15.5)
WBC: 4.4 10*3/uL (ref 4.0–10.5)

## 2014-04-29 LAB — COMPREHENSIVE METABOLIC PANEL
ALT: 17 U/L (ref 0–35)
AST: 25 U/L (ref 0–37)
Albumin: 3.9 g/dL (ref 3.5–5.2)
Alkaline Phosphatase: 64 U/L (ref 39–117)
BUN: 14 mg/dL (ref 6–23)
CO2: 29 mEq/L (ref 19–32)
Calcium: 9.7 mg/dL (ref 8.4–10.5)
Chloride: 103 mEq/L (ref 96–112)
Creatinine, Ser: 0.83 mg/dL (ref 0.40–1.20)
GFR: 68.91 mL/min (ref >60.00)
Glucose, Bld: 97 mg/dL (ref 70–99)
Potassium: 4.6 mEq/L (ref 3.5–5.1)
Sodium: 137 mEq/L (ref 135–145)
Total Bilirubin: 0.4 mg/dL (ref 0.2–1.2)
Total Protein: 6.5 g/dL (ref 6.0–8.3)

## 2014-04-29 NOTE — Progress Notes (Signed)
Pre visit review using our clinic review tool, if applicable. No additional management support is needed unless otherwise documented below in the visit note. 

## 2014-04-29 NOTE — Assessment & Plan Note (Signed)
Does okay with her aleve Will check renal On PPI

## 2014-04-29 NOTE — Assessment & Plan Note (Signed)
No symptoms  Still in remission Will check ESR

## 2014-04-29 NOTE — Assessment & Plan Note (Signed)
See social history °Has DNR °

## 2014-04-29 NOTE — Assessment & Plan Note (Signed)
BP Readings from Last 3 Encounters:  04/29/14 122/76  03/17/14 126/76  12/31/13 150/80   Good control Due for labs

## 2014-04-29 NOTE — Progress Notes (Signed)
Subjective:    [name], female    DOB: [birth year], 81 y.o.   MRN: [id number]  HPI Here for Medicare wellness and follow up of chronic medical issues Reviewed her advanced directives No tobacco Drinks wine ~ 5 days per week. 1 glass mostly. No other doctor other than dentist, Dr Kellie Moor for derm, eye doctor [facility]) Does have occasional recall issues like names---but no functional cognitive decline Tries to stay active. Water aerobics twice a week and some walking Vision okay. Hearing is not great---- has aides but they don't help much Independent with all instrumental ADLs Reviewed her meds No hospitalization or procedures in past year No depression or anhedonia No falls  Did have DEXA about 3 years ago This showed stable osteoporosis On vitamin D and calcium in multivitamin   Still gets some right knee pain Occasionally will awaken her at night "Not slowing me down" No meds other than occasional aleve (1 or 2 a day fairly regularly-- 4-5 days per week)  Continues on the omeprazole daily Heartburn is controlled No swallowing problems  No myalgia or signs of the PMR No regular headaches  No chest pain No SOB---just the nasal congestion No dizziness or syncope Slight edema at times---relates to past fracture on left  Current Outpatient Prescriptions on File Prior to Visit  Medication Sig Dispense Refill  . aspirin 81 MG tablet Take 81 mg by mouth daily.      . cholecalciferol (VITAMIN D) 1000 UNITS tablet Take 1,000 Units by mouth daily.    . fish oil-omega-3 fatty acids 1000 MG capsule Take 2 g by mouth daily.      Marland Kitchen losartan-hydrochlorothiazide (HYZAAR) 50-12.5 MG per tablet Take 1 tablet by mouth daily. 90 tablet 3  . Melatonin 5 MG CAPS Take 1 capsule by mouth daily as needed.    . Multiple Vitamin (MULTIVITAMIN) capsule Take 1 capsule by mouth daily.      Marland Kitchen omeprazole (PRILOSEC) 20 MG capsule Take 20 mg by mouth daily.      Marland Kitchen OVER THE  COUNTER MEDICATION protagen  OTC 1 capsule daily     No current facility-administered medications on file prior to visit.    Allergies  Allergen Reactions  . Alendronate Sodium     REACTION: GI    Past Medical History  Diagnosis Date  . Diverticulosis   . GERD (gastroesophageal reflux disease)   . Hyperlipidemia   . Hypertension   . Osteoporosis   . Polymyalgia rheumatica     Past Surgical History  Procedure Laterality Date  . Tonsillectomy    . Ankle fracture surgery  07/12/2005  . Cataract extraction  2012    right    Family History  Problem Relation Age of Onset  . Arthritis Mother   . Heart disease Father 74  . Diabetes Brother   . Cancer Sister     ovarian  . Stroke Sister     History   Social History  . Marital Status: Widowed    Spouse Name: N/A  . Number of Children: 2  . Years of Education: N/A   Occupational History  . House wife, farming    Social History Main Topics  . Smoking status: Never Smoker   . Smokeless tobacco: Never Used  . Alcohol Use: 0.0 oz/week    0 Standard drinks or equivalent per week     Comment: wine  . Drug Use: No  . Sexual Activity: Not on file  Other Topics Concern  . Not on file   Social History Narrative   Has living will   Had two daughters but neither in town, they share health care POA.     Has DNR already   Request no feeding tube.  Does not want extended ventilator support.   Review of Systems Seems to be better from bronchitis Some trouble with nasal congestion--thinks her nares are small. Hasn't used any spray Appetite is fine  Weight is stable Sleeps well in general Occasional itchy moles---keeps up with derm Bowels are okay    Objective:   Physical Exam  Constitutional: She is oriented to person, place, and time. She appears well-developed and well-nourished. No distress.  HENT:  Mouth/Throat: Oropharynx is clear and moist. No oropharyngeal exudate.  Eyes: Conjunctivae and EOM are normal.  Pupils are equal, round, and reactive to light.  Neck: Normal range of motion. Neck supple. No thyromegaly present.  Cardiovascular: Normal rate, regular rhythm, normal heart sounds and intact distal pulses.  Exam reveals no gallop.   No murmur heard. Pulmonary/Chest: Effort normal and breath sounds normal. No respiratory distress. She has no wheezes. She has no rales.  Abdominal: Soft. There is no tenderness.  Musculoskeletal: She exhibits no edema or tenderness.  Lymphadenopathy:    She has no cervical adenopathy.  Neurological: She is alert and oriented to person, place, and time.  President--- "Obama, Clinton, then Bush" [id number] Recall 1/3  Skin: No rash noted. No erythema.  Psychiatric: She has a normal mood and affect. Her behavior is normal.          Assessment & Plan:

## 2014-04-29 NOTE — Addendum Note (Signed)
Addended by: Emelia Salisbury C on: 04/29/2014 10:04 AM   Modules accepted: Orders

## 2014-04-29 NOTE — Assessment & Plan Note (Signed)
I have personally reviewed the Medicare Annual Wellness questionnaire and have noted 1. The patient's medical and social history 2. Their use of alcohol, tobacco or illicit drugs 3. Their current medications and supplements 4. The patient's functional ability including ADL's, fall risks, home safety risks and hearing or visual             impairment. 5. Diet and physical activities 6. Evidence for depression or mood disorders  The patients weight, height, BMI and visual acuity have been recorded in the chart I have made referrals, counseling and provided education to the patient based review of the above and I have provided the pt with a written personalized care plan for preventive services.  I have provided you with a copy of your personalized plan for preventive services. Please take the time to review along with your updated medication list.  Due for prevnar today No cancer screening due to age Mild memory problems but no functional issues Humidifier for pale nasal congestion (probably from heat)

## 2014-04-29 NOTE — Assessment & Plan Note (Signed)
On vitamin D and calcium in vitamin Does walk some Rx not indicated

## 2014-04-29 NOTE — Assessment & Plan Note (Signed)
Quiet on her PPI Will continue since protection with NSAID also

## 2014-04-30 ENCOUNTER — Encounter: Payer: Self-pay | Admitting: *Deleted

## 2014-06-04 DIAGNOSIS — L98429 Non-pressure chronic ulcer of back with unspecified severity: Secondary | ICD-10-CM | POA: Diagnosis not present

## 2014-06-04 DIAGNOSIS — L821 Other seborrheic keratosis: Secondary | ICD-10-CM | POA: Diagnosis not present

## 2015-03-06 IMAGING — CR DG KNEE 1-2V*R*
3 series · 3 of 3 positions shown · non-contrast
Comparison: None.

CLINICAL DATA: Medial right knee pain.

EXAM:
RIGHT KNEE - 1-2 VIEW

[view not recorded (1 of 3)]
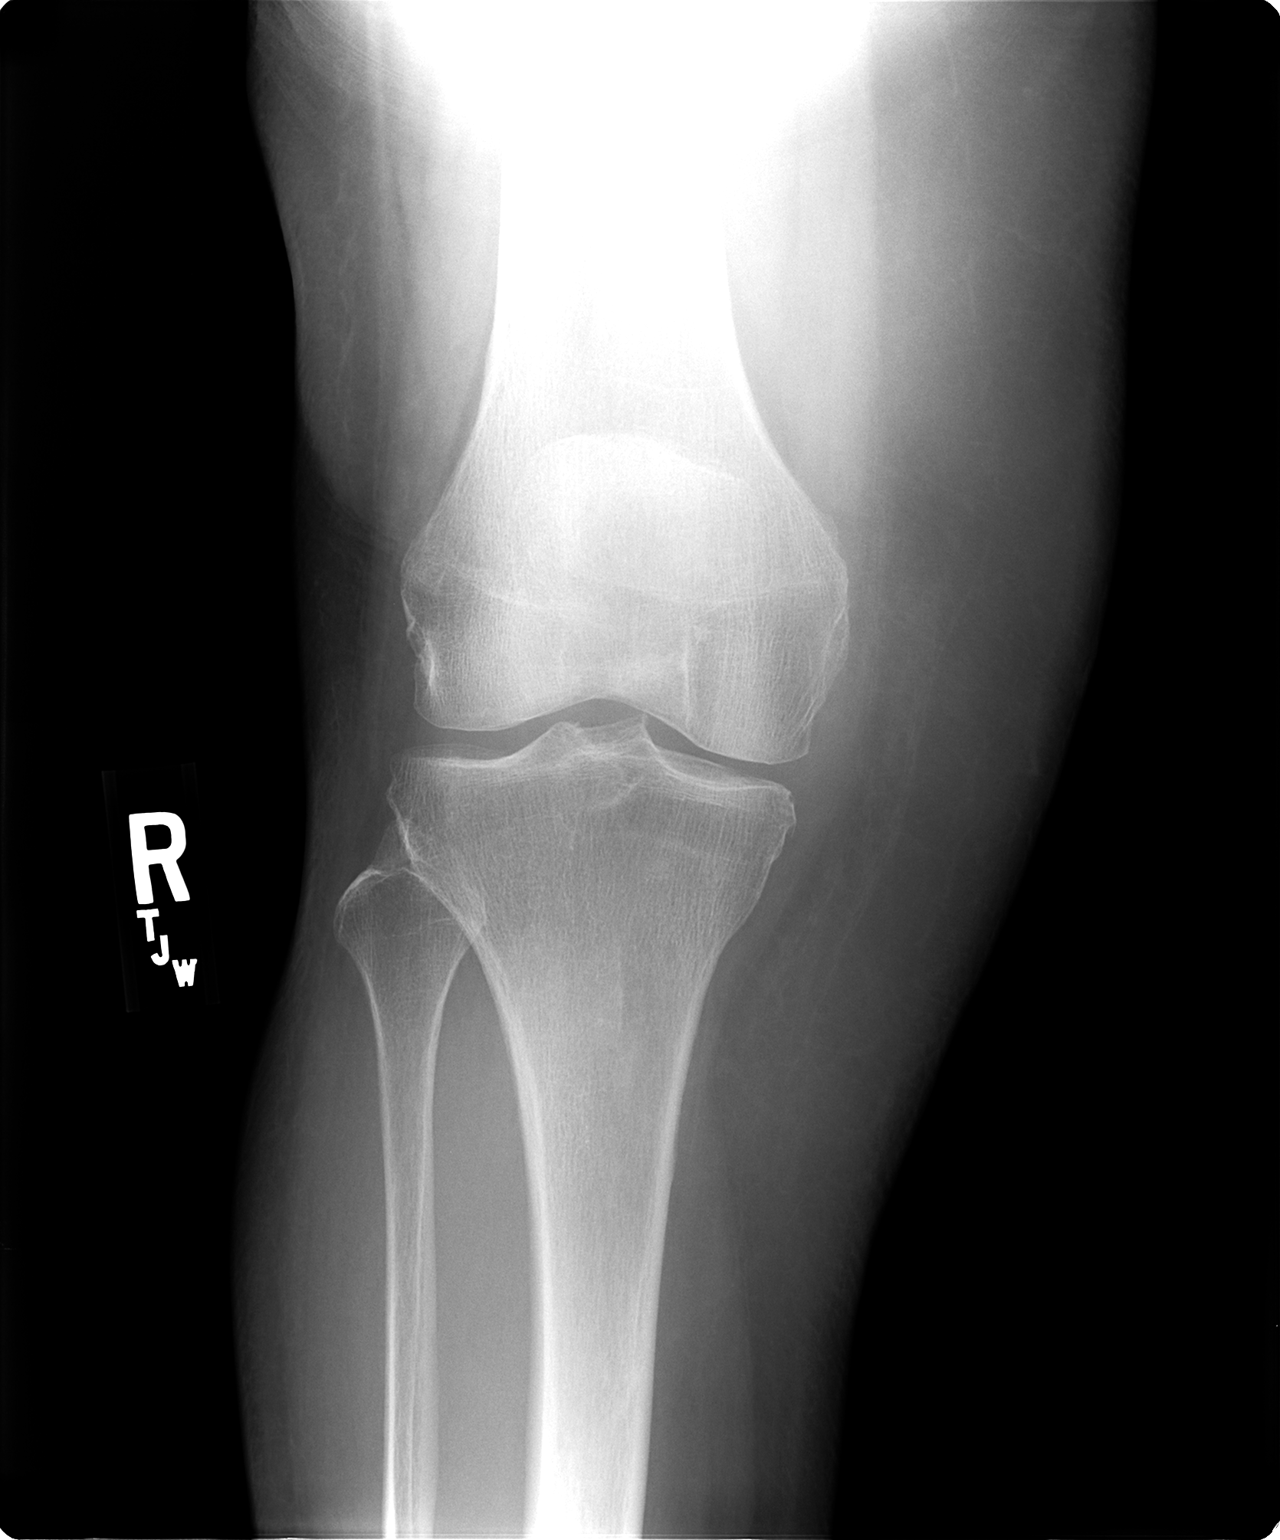

[view not recorded (2 of 3)]
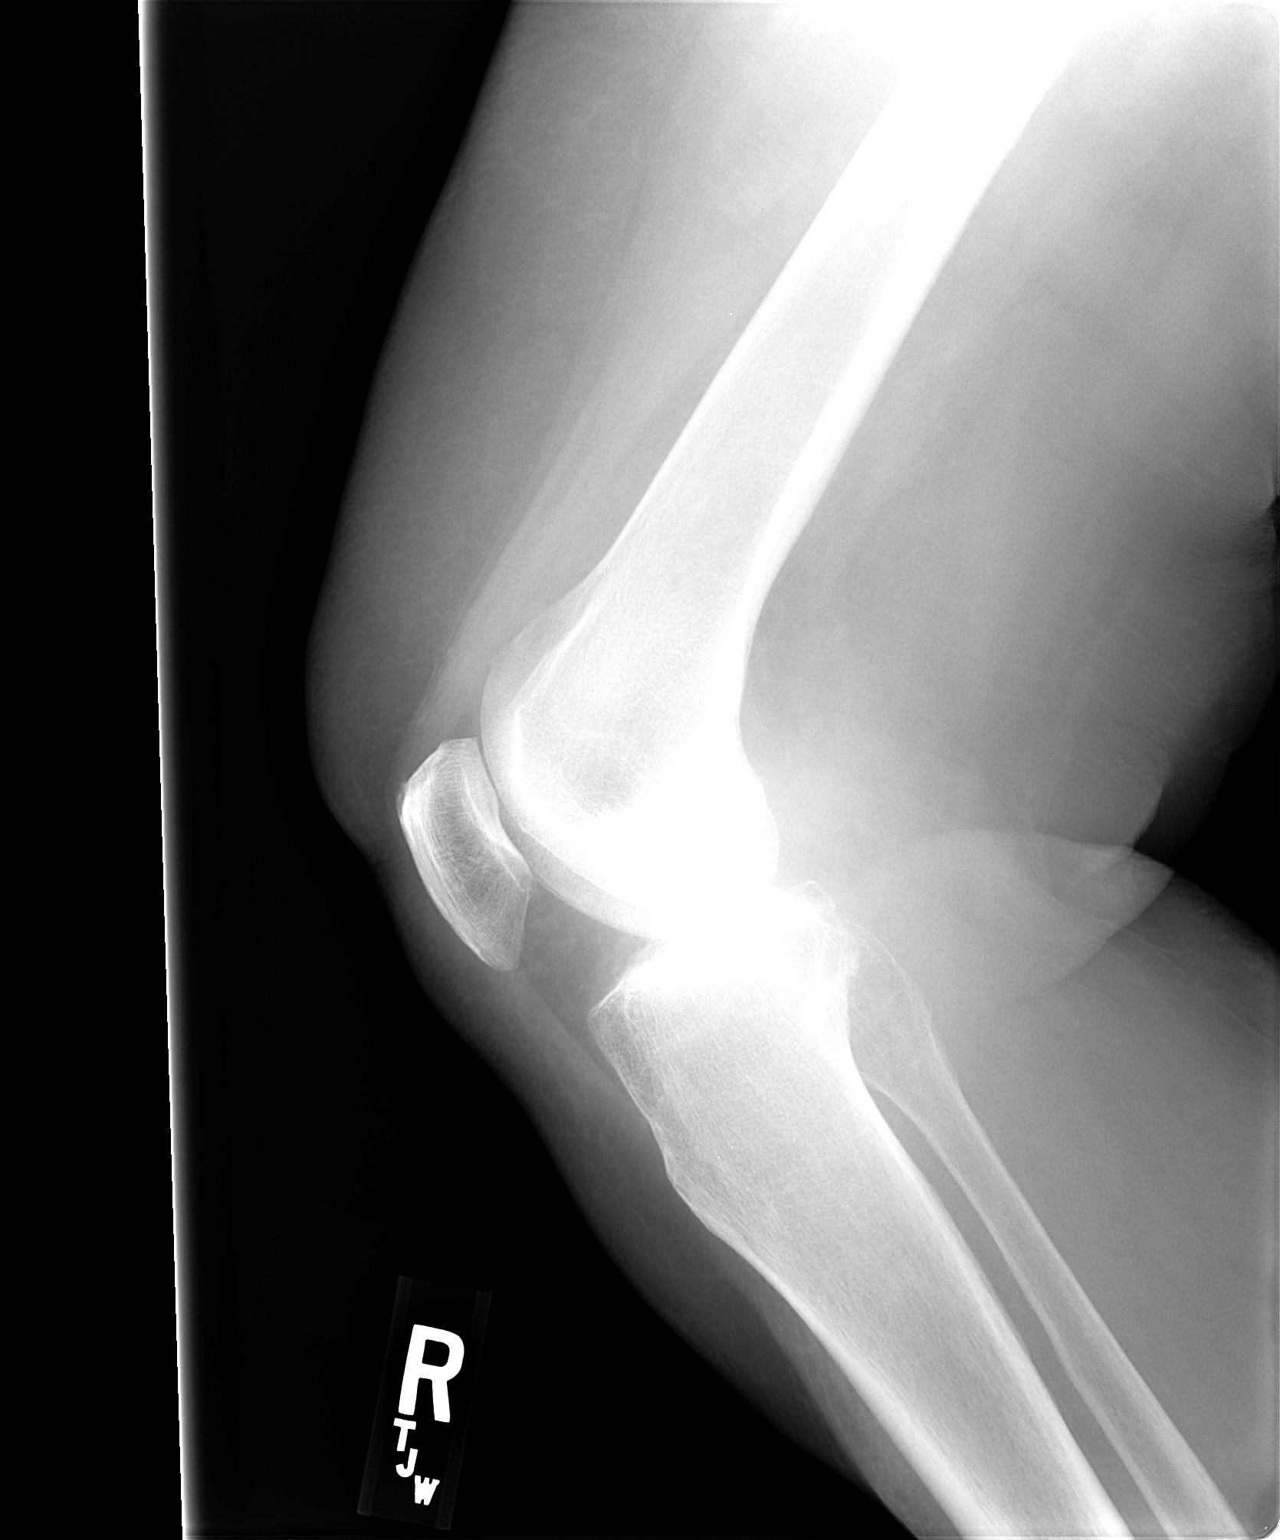

[view not recorded (3 of 3)]
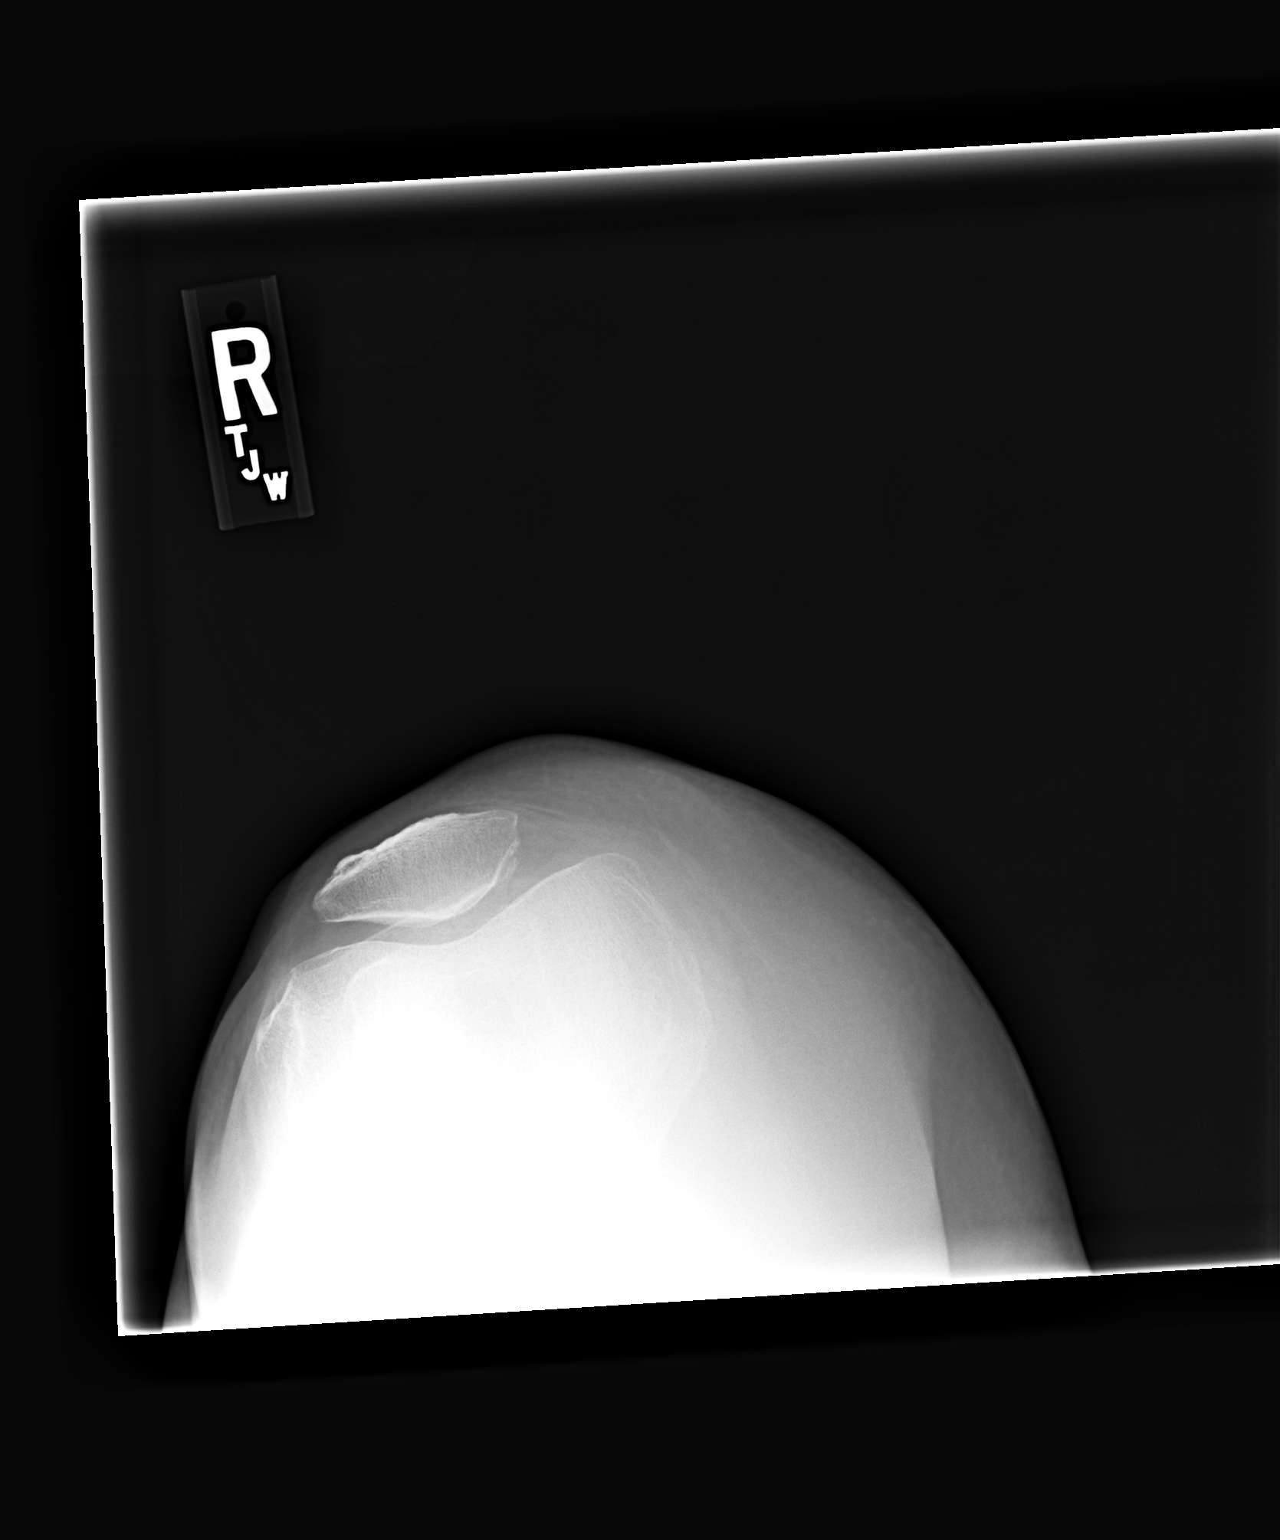

[3 of 3 positions shown; findings below may reference images not displayed]

FINDINGS: Mild joint space narrowing and spurring in the medial knee
compartment. No evidence for a joint effusion. The patella is
located. Negative for fracture or dislocation.
IMPRESSION: Mild osteoarthritis in the right knee.  No acute bone abnormality.

## 2015-04-14 ENCOUNTER — Other Ambulatory Visit: Payer: Self-pay | Admitting: Internal Medicine

## 2015-05-06 ENCOUNTER — Ambulatory Visit (INDEPENDENT_AMBULATORY_CARE_PROVIDER_SITE_OTHER): Payer: Commercial Managed Care - HMO | Admitting: Internal Medicine

## 2015-05-06 ENCOUNTER — Encounter: Payer: Self-pay | Admitting: Internal Medicine

## 2015-05-06 ENCOUNTER — Encounter: Payer: Self-pay | Admitting: *Deleted

## 2015-05-06 VITALS — BP 138/92 | HR 71 | Temp 98.0°F | Ht 59.5 in | Wt 159.0 lb

## 2015-05-06 DIAGNOSIS — K219 Gastro-esophageal reflux disease without esophagitis: Secondary | ICD-10-CM | POA: Diagnosis not present

## 2015-05-06 DIAGNOSIS — M159 Polyosteoarthritis, unspecified: Secondary | ICD-10-CM | POA: Diagnosis not present

## 2015-05-06 DIAGNOSIS — Z0000 Encounter for general adult medical examination without abnormal findings: Secondary | ICD-10-CM

## 2015-05-06 DIAGNOSIS — I10 Essential (primary) hypertension: Secondary | ICD-10-CM | POA: Diagnosis not present

## 2015-05-06 DIAGNOSIS — M353 Polymyalgia rheumatica: Secondary | ICD-10-CM

## 2015-05-06 DIAGNOSIS — N3941 Urge incontinence: Secondary | ICD-10-CM

## 2015-05-06 DIAGNOSIS — Z7189 Other specified counseling: Secondary | ICD-10-CM

## 2015-05-06 LAB — CBC WITH DIFFERENTIAL/PLATELET
BASOS PCT: 0.4 % (ref 0.0–3.0)
Basophils Absolute: 0 10*3/uL (ref 0.0–0.1)
EOS PCT: 1 % (ref 0.0–5.0)
Eosinophils Absolute: 0.1 10*3/uL (ref 0.0–0.7)
HCT: 36.9 % (ref 36.0–46.0)
HEMOGLOBIN: 12.4 g/dL (ref 12.0–15.0)
LYMPHS ABS: 1 10*3/uL (ref 0.7–4.0)
Lymphocytes Relative: 19.8 % (ref 12.0–46.0)
MCHC: 33.5 g/dL (ref 30.0–36.0)
MCV: 86.8 fl (ref 78.0–100.0)
Monocytes Absolute: 0.5 10*3/uL (ref 0.1–1.0)
Monocytes Relative: 10.3 % (ref 3.0–12.0)
Neutro Abs: 3.5 10*3/uL (ref 1.4–7.7)
Neutrophils Relative %: 68.5 % (ref 43.0–77.0)
Platelets: 215 10*3/uL (ref 150.0–400.0)
RBC: 4.24 Mil/uL (ref 3.87–5.11)
RDW: 14.7 % (ref 11.5–15.5)
WBC: 5.2 10*3/uL (ref 4.0–10.5)

## 2015-05-06 LAB — COMPREHENSIVE METABOLIC PANEL
ALBUMIN: 4 g/dL (ref 3.5–5.2)
ALT: 15 U/L (ref 0–35)
AST: 21 U/L (ref 0–37)
Alkaline Phosphatase: 63 U/L (ref 39–117)
BUN: 15 mg/dL (ref 6–23)
CHLORIDE: 103 meq/L (ref 96–112)
CO2: 29 meq/L (ref 19–32)
CREATININE: 0.88 mg/dL (ref 0.40–1.20)
Calcium: 10 mg/dL (ref 8.4–10.5)
GFR: 64.26 mL/min (ref >60.00)
Glucose, Bld: 101 mg/dL — ABNORMAL HIGH (ref 70–99)
POTASSIUM: 4.5 meq/L (ref 3.5–5.1)
SODIUM: 137 meq/L (ref 135–145)
Total Bilirubin: 0.4 mg/dL (ref 0.2–1.2)
Total Protein: 6.4 g/dL (ref 6.0–8.3)

## 2015-05-06 LAB — SEDIMENTATION RATE: Sed Rate: 20 mm/hr (ref 0–22)

## 2015-05-06 NOTE — Assessment & Plan Note (Signed)
Uses low dose aleve with success

## 2015-05-06 NOTE — Assessment & Plan Note (Signed)
BP Readings from Last 3 Encounters:  05/06/15 138/92  04/29/14 122/76  03/17/14 126/76   Acceptable control No changes needed

## 2015-05-06 NOTE — Assessment & Plan Note (Signed)
Does okay on the medication

## 2015-05-06 NOTE — Assessment & Plan Note (Signed)
Some back aching but still seems to be in remission Will check ESR

## 2015-05-06 NOTE — Assessment & Plan Note (Signed)
Has DNR 

## 2015-05-06 NOTE — Progress Notes (Signed)
Subjective:    [name], female    DOB: May 03, 1923, 82 y.o.   MRN: [id number]  HPI Here for Medicare wellness and follow up of chronic medical conditions Reviewed form and advanced directives Reviewed other doctors 1-2 glasses of wine nightly No tobacco Vision is fine Hearing acceptable with her aides Tries to exercise regularly No falls No sig depression and not anhedonic Independent with instrumental ADLs Feels her thinking is slower--but no major cognitive issues  Feels she is slowing down Concerned that she needs a handicapped permit---legs are weaker and she gives out at times Discussed that she is not quite there Recommended weight training regimen--asked her to meet with Legrand Como  Has some back aching Uses a elastic belt and feels this helps No shoulder or hip aching now Does tire out in the afternoon---this has helped with tumeric  Still with bladder problems Aggravating--- urgency and has to rush to bathroom. Uses pad just in case  No chest pain--though occ sore spot by left shoulder No SOB--stamina is stable No dizziness or syncope No sig edema No palpitations  Still on omeprazole Thinks the magnesium in it helps her No swallowing problems  Current Outpatient Prescriptions on File Prior to Visit  Medication Sig Dispense Refill  . aspirin 81 MG tablet Take 81 mg by mouth daily.      . cholecalciferol (VITAMIN D) 1000 UNITS tablet Take 1,000 Units by mouth daily.    . fish oil-omega-3 fatty acids 1000 MG capsule Take 1 g by mouth daily.     Marland Kitchen losartan-hydrochlorothiazide (HYZAAR) 50-12.5 MG tablet TAKE 1 TABLET BY MOUTH DAILY. 90 tablet 3  . Multiple Vitamin (MULTIVITAMIN) capsule Take 1 capsule by mouth daily.      . Naproxen Sodium 220 MG CAPS Take 1 capsule by mouth 2 (two) times daily as needed.    Marland Kitchen omeprazole (PRILOSEC) 20 MG capsule Take 20 mg by mouth daily.       No current facility-administered medications on file prior to visit.     Allergies  Allergen Reactions  . Alendronate Sodium     REACTION: GI    Past Medical History  Diagnosis Date  . Diverticulosis   . GERD (gastroesophageal reflux disease)   . Hyperlipidemia   . Hypertension   . Osteoporosis   . Polymyalgia rheumatica (HCC)     Past Surgical History  Procedure Laterality Date  . Tonsillectomy    . Ankle fracture surgery  07/12/2005  . Cataract extraction  2012    right    Family History  Problem Relation Age of Onset  . Arthritis Mother   . Heart disease Father 53  . Diabetes Brother   . Cancer Sister     ovarian  . Stroke Sister   . Heart disease Daughter   . Rheum arthritis Daughter     Social History   Social History  . Marital Status: Widowed    Spouse Name: N/A  . Number of Children: 2  . Years of Education: N/A   Occupational History  . House wife, farming    Social History Main Topics  . Smoking status: Never Smoker   . Smokeless tobacco: Never Used  . Alcohol Use: 0.0 oz/week    0 Standard drinks or equivalent per week     Comment: wine  . Drug Use: No  . Sexual Activity: Not on file   Other Topics Concern  . Not on file   Social History Narrative  Has living will   1 living daughter is  health care POA.     Has DNR already   Request no feeding tube.  Does not want extended ventilator support.   Review of Systems Wears seat belt Keeps up with dentist-- Dr Brooke Bonito Multiple moles--keeps up with dermatologist. Is due now. Dr Kellie Moor Appetite is good Weight is stable Sleeps well Bowels are fine. No blood in stool. No abdominal pain    Objective:   Physical Exam  Constitutional: She is oriented to person, place, and time. She appears well-developed and well-nourished. No distress.  HENT:  Mouth/Throat: Oropharynx is clear and moist. No oropharyngeal exudate.  Neck: Normal range of motion. Neck supple. No thyromegaly present.  Cardiovascular: Normal rate, regular rhythm, normal heart sounds and  intact distal pulses.  Exam reveals no gallop.   No murmur heard. Pulmonary/Chest: Effort normal and breath sounds normal. No respiratory distress. She has no wheezes. She has no rales.  Abdominal: Soft. There is no tenderness.  Musculoskeletal: She exhibits no edema or tenderness.  Lymphadenopathy:    She has no cervical adenopathy.  Neurological: She is alert and oriented to person, place, and time.  President-- "Dwaine Deter, Bush" [phone] D-l-r-o-w Recall 2/3  Skin: No rash noted. No erythema.  Psychiatric: She has a normal mood and affect. Her behavior is normal.          Assessment & Plan:

## 2015-05-06 NOTE — Assessment & Plan Note (Signed)
I have personally reviewed the Medicare Annual Wellness questionnaire and have noted 1. The patient's medical and social history 2. Their use of alcohol, tobacco or illicit drugs 3. Their current medications and supplements 4. The patient's functional ability including ADL's, fall risks, home safety risks and hearing or visual             impairment. 5. Diet and physical activities 6. Evidence for depression or mood disorders  The patients weight, height, BMI and visual acuity have been recorded in the chart I have made referrals, counseling and provided education to the patient based review of the above and I have provided the pt with a written personalized care plan for preventive services.  I have provided you with a copy of your personalized plan for preventive services. Please take the time to review along with your updated medication list.  UTD on immunizations--just yearly flu vaccine No cancer screening due to age Discussed doing more exercise

## 2015-05-06 NOTE — Addendum Note (Signed)
Addended by: Ellamae Sia on: 05/06/2015 09:23 AM   Modules accepted: Orders

## 2015-05-06 NOTE — Assessment & Plan Note (Signed)
Mostly urgency  Wears pad only just in case Recommended she not try meds for this

## 2015-05-12 DIAGNOSIS — L821 Other seborrheic keratosis: Secondary | ICD-10-CM | POA: Diagnosis not present

## 2015-05-12 DIAGNOSIS — D0472 Carcinoma in situ of skin of left lower limb, including hip: Secondary | ICD-10-CM | POA: Diagnosis not present

## 2015-05-12 DIAGNOSIS — D485 Neoplasm of uncertain behavior of skin: Secondary | ICD-10-CM | POA: Diagnosis not present

## 2015-06-11 DIAGNOSIS — D0472 Carcinoma in situ of skin of left lower limb, including hip: Secondary | ICD-10-CM | POA: Diagnosis not present

## 2015-08-06 DIAGNOSIS — H2512 Age-related nuclear cataract, left eye: Secondary | ICD-10-CM | POA: Diagnosis not present

## 2016-04-27 DIAGNOSIS — H2512 Age-related nuclear cataract, left eye: Secondary | ICD-10-CM | POA: Diagnosis not present

## 2016-05-11 ENCOUNTER — Encounter: Payer: Self-pay | Admitting: Internal Medicine

## 2016-05-11 ENCOUNTER — Encounter: Payer: Self-pay | Admitting: *Deleted

## 2016-05-11 ENCOUNTER — Other Ambulatory Visit: Payer: Self-pay | Admitting: Internal Medicine

## 2016-05-11 ENCOUNTER — Ambulatory Visit (INDEPENDENT_AMBULATORY_CARE_PROVIDER_SITE_OTHER): Payer: Medicare HMO | Admitting: Internal Medicine

## 2016-05-11 VITALS — BP 146/78 | HR 69 | Temp 97.4°F | Ht 59.5 in | Wt 158.5 lb

## 2016-05-11 DIAGNOSIS — Z7189 Other specified counseling: Secondary | ICD-10-CM

## 2016-05-11 DIAGNOSIS — N3941 Urge incontinence: Secondary | ICD-10-CM | POA: Diagnosis not present

## 2016-05-11 DIAGNOSIS — I10 Essential (primary) hypertension: Secondary | ICD-10-CM

## 2016-05-11 DIAGNOSIS — M159 Polyosteoarthritis, unspecified: Secondary | ICD-10-CM

## 2016-05-11 DIAGNOSIS — M353 Polymyalgia rheumatica: Secondary | ICD-10-CM

## 2016-05-11 DIAGNOSIS — Z0000 Encounter for general adult medical examination without abnormal findings: Secondary | ICD-10-CM

## 2016-05-11 DIAGNOSIS — K219 Gastro-esophageal reflux disease without esophagitis: Secondary | ICD-10-CM | POA: Diagnosis not present

## 2016-05-11 LAB — COMPREHENSIVE METABOLIC PANEL
ALBUMIN: 4 g/dL (ref 3.5–5.2)
ALK PHOS: 61 U/L (ref 39–117)
ALT: 17 U/L (ref 0–35)
AST: 20 U/L (ref 0–37)
BUN: 15 mg/dL (ref 6–23)
CALCIUM: 9.9 mg/dL (ref 8.4–10.5)
CO2: 28 mEq/L (ref 19–32)
Chloride: 103 mEq/L (ref 96–112)
Creatinine, Ser: 0.82 mg/dL (ref 0.40–1.20)
GFR: 69.56 mL/min (ref >60.00)
Glucose, Bld: 104 mg/dL — ABNORMAL HIGH (ref 70–99)
POTASSIUM: 4.8 meq/L (ref 3.5–5.1)
Sodium: 137 mEq/L (ref 135–145)
TOTAL PROTEIN: 6.5 g/dL (ref 6.0–8.3)
Total Bilirubin: 0.4 mg/dL (ref 0.2–1.2)

## 2016-05-11 LAB — SEDIMENTATION RATE: Sed Rate: 13 mm/hr (ref 0–30)

## 2016-05-11 LAB — CBC WITH DIFFERENTIAL/PLATELET
Basophils Absolute: 0 10*3/uL (ref 0.0–0.1)
Basophils Relative: 0.4 % (ref 0.0–3.0)
EOS PCT: 1 % (ref 0.0–5.0)
Eosinophils Absolute: 0 10*3/uL (ref 0.0–0.7)
HCT: 37.7 % (ref 36.0–46.0)
HEMOGLOBIN: 12.7 g/dL (ref 12.0–15.0)
Lymphocytes Relative: 22.2 % (ref 12.0–46.0)
Lymphs Abs: 1 10*3/uL (ref 0.7–4.0)
MCHC: 33.6 g/dL (ref 30.0–36.0)
MCV: 90.6 fl (ref 78.0–100.0)
MONO ABS: 0.5 10*3/uL (ref 0.1–1.0)
MONOS PCT: 12.1 % — AB (ref 3.0–12.0)
Neutro Abs: 2.8 10*3/uL (ref 1.4–7.7)
Neutrophils Relative %: 64.3 % (ref 43.0–77.0)
Platelets: 196 10*3/uL (ref 150.0–400.0)
RBC: 4.16 Mil/uL (ref 3.87–5.11)
RDW: 13.4 % (ref 11.5–15.5)
WBC: 4.4 10*3/uL (ref 4.0–10.5)

## 2016-05-11 LAB — T4, FREE: FREE T4: 1.12 ng/dL (ref 0.60–1.60)

## 2016-05-11 NOTE — Progress Notes (Signed)
Pre visit review using our clinic review tool, if applicable. No additional management support is needed unless otherwise documented below in the visit note. 

## 2016-05-11 NOTE — Assessment & Plan Note (Signed)
Controlled with the PPI 

## 2016-05-11 NOTE — Assessment & Plan Note (Signed)
Seems to be in remission still Will check sed rate

## 2016-05-11 NOTE — Progress Notes (Signed)
Subjective:    [name], female    DOB: 1924-01-27, 83 y.o.   MRN: [id number]  HPI Here for Medicare wellness and follow up of chronic health conditions Reviewed form and advanced directives Reviewed other doctors No tobacco Still enjoys 1-2 glasses of wine daily Tries to keep up with water aerobics Vision is okay--but not great Has hearing aides No falls No regular depression or anhedonia Independent with instrumental ADLs No sig memory problems  Overall she is doing okay Does feel her age some days--but overall doing well Has had some back problems---from arthritis This seems to be better Attributes this to the tumeric Feels that she has more energy Did go see Legrand Como the fitness director---"hated" the routine Still does water aerobics  BP up some today Does occasionally check with the [facility] nurse 156/78, 160/62 (but not sitting before they were done and she was stressed) No chest pain or SOB Some dizziness over the past few weeks--- upon first arising out of bed Stamina is actually better---does rest in the afternoon Rarely has ankle edema---if on long trip No palpitations  No apparent recurrence of PMR No significant myalgias No headaches No unilateral vision loss  Still troubled by bladder irritability Tries to keep bladder empty and wears pad if she is away from home Mostly urgency  Was taking the OTC oxybutynin--- this may have been associated with the dizziness  GERD not bad Does have symptoms if she holds the omeprazole No heartburn or dysphagia when taking  Current Outpatient Prescriptions on File Prior to Visit  Medication Sig Dispense Refill  . aspirin 81 MG tablet Take 81 mg by mouth daily.      . cholecalciferol (VITAMIN D) 1000 UNITS tablet Take 1,000 Units by mouth daily.    . fish oil-omega-3 fatty acids 1000 MG capsule Take 1 g by mouth daily.     Marland Kitchen losartan-hydrochlorothiazide (HYZAAR) 50-12.5 MG tablet TAKE 1 TABLET BY  MOUTH DAILY. 90 tablet 3  . Multiple Vitamin (MULTIVITAMIN) capsule Take 1 capsule by mouth daily.      . Naproxen Sodium 220 MG CAPS Take 1 capsule by mouth 2 (two) times daily as needed.    Marland Kitchen omeprazole (PRILOSEC) 20 MG capsule Take 20 mg by mouth daily.      Marland Kitchen OVER THE COUNTER MEDICATION Curcumin 2K Take 1 Daily     No current facility-administered medications on file prior to visit.     Allergies  Allergen Reactions  . Alendronate Sodium     REACTION: GI    Past Medical History:  Diagnosis Date  . Diverticulosis   . GERD (gastroesophageal reflux disease)   . Hyperlipidemia   . Hypertension   . Osteoporosis   . Polymyalgia rheumatica (HCC)     Past Surgical History:  Procedure Laterality Date  . ANKLE FRACTURE SURGERY  07/12/2005  . CATARACT EXTRACTION  2012   right  . TONSILLECTOMY      Family History  Problem Relation Age of Onset  . Arthritis Mother   . Heart disease Father 31  . Diabetes Brother   . Cancer Sister     ovarian  . Stroke Sister   . Heart disease Daughter   . Rheum arthritis Daughter     Social History   Social History  . Marital status: Widowed    Spouse name: N/A  . Number of children: 2  . Years of education: N/A   Occupational History  . House wife,  farming    Social History Main Topics  . Smoking status: Never Smoker  . Smokeless tobacco: Never Used  . Alcohol use 0.0 oz/week     Comment: wine  . Drug use: No  . Sexual activity: Not on file   Other Topics Concern  . Not on file   Social History Narrative   Has living will   1 living daughter is  health care POA.     Has DNR already   Request no feeding tube.  Does not want extended ventilator support.   Review of Systems Disappointed in right cataract surgery--now told she needs it in left Appetite is okay Weight is stable Sleeps well in general Wears seat belt Some issues with her teeth---recent extraction and new bridge [name] Did have spot removed by  dermatologist (goes yearly)-- [place] Bowels are okay---no blood     Objective:   Physical Exam  Constitutional: She is oriented to person, place, and time. She appears well-nourished. No distress.  HENT:  Mouth/Throat: Oropharynx is clear and moist. No oropharyngeal exudate.  Neck: No thyromegaly present.  Cardiovascular: Normal rate, regular rhythm, normal heart sounds and intact distal pulses.  Exam reveals no gallop.   No murmur heard. Pulmonary/Chest: Effort normal and breath sounds normal. No respiratory distress. She has no wheezes. She has no rales.  Abdominal: Soft. There is no tenderness.  Musculoskeletal: She exhibits no edema or tenderness.  Lymphadenopathy:    She has no cervical adenopathy.  Neurological: She is alert and oriented to person, place, and time.  President-- "Trump, Obama, Clinton---Bush" [phone].... D-l-r-o-w Recall 2/3   Skin: No rash noted. No erythema.  Psychiatric: She has a normal mood and affect. Her behavior is normal.          Assessment & Plan:

## 2016-05-11 NOTE — Assessment & Plan Note (Signed)
I have personally reviewed the Medicare Annual Wellness questionnaire and have noted 1. The patient's medical and social history 2. Their use of alcohol, tobacco or illicit drugs 3. Their current medications and supplements 4. The patient's functional ability including ADL's, fall risks, home safety risks and hearing or visual             impairment. 5. Diet and physical activities 6. Evidence for depression or mood disorders  The patients weight, height, BMI and visual acuity have been recorded in the chart I have made referrals, counseling and provided education to the patient based review of the above and I have provided the pt with a written personalized care plan for preventive services.  I have provided you with a copy of your personalized plan for preventive services. Please take the time to review along with your updated medication list.  Discussed Td booster--she prefers to wait No cancer screening due to age Discussed fitness

## 2016-05-11 NOTE — Assessment & Plan Note (Signed)
Tried OTC oxybutynin--not much help and ??dizziness She has stopped it

## 2016-05-11 NOTE — Assessment & Plan Note (Signed)
BP Readings from Last 3 Encounters:  05/11/16 (!) 146/78  05/06/15 (!) 138/92  04/29/14 122/76   Mildly elevated but she has some orthostatic dizziness so I am reluctant to increase her meds Discussed checking with nurses at [facility]. Will double med if consistently over 150/90

## 2016-05-11 NOTE — Patient Instructions (Signed)
Please have the nurse at [facility] check your blood pressure about once a month. Make sure you have been sitting at rest for over 5 minutes before it is taken. Let me know if it is consistently over 150/90.

## 2016-05-11 NOTE — Assessment & Plan Note (Signed)
Seems to be better over the past year

## 2016-05-11 NOTE — Assessment & Plan Note (Signed)
Has DNR 

## 2016-07-11 DIAGNOSIS — H6123 Impacted cerumen, bilateral: Secondary | ICD-10-CM | POA: Diagnosis not present

## 2016-07-11 DIAGNOSIS — H903 Sensorineural hearing loss, bilateral: Secondary | ICD-10-CM | POA: Diagnosis not present

## 2017-04-07 ENCOUNTER — Other Ambulatory Visit: Payer: Self-pay | Admitting: Internal Medicine

## 2017-05-17 ENCOUNTER — Encounter: Payer: Medicare HMO | Admitting: Internal Medicine

## 2017-05-31 ENCOUNTER — Encounter: Payer: Medicare HMO | Admitting: Internal Medicine

## 2017-06-09 DIAGNOSIS — H2512 Age-related nuclear cataract, left eye: Secondary | ICD-10-CM | POA: Diagnosis not present

## 2017-06-12 ENCOUNTER — Other Ambulatory Visit: Payer: Self-pay | Admitting: Internal Medicine

## 2017-08-09 ENCOUNTER — Ambulatory Visit (INDEPENDENT_AMBULATORY_CARE_PROVIDER_SITE_OTHER): Payer: Medicare HMO | Admitting: Internal Medicine

## 2017-08-09 ENCOUNTER — Encounter: Payer: Self-pay | Admitting: Internal Medicine

## 2017-08-09 ENCOUNTER — Encounter

## 2017-08-09 VITALS — BP 138/76 | HR 74 | Temp 98.2°F | Ht 59.5 in | Wt 158.0 lb

## 2017-08-09 DIAGNOSIS — K219 Gastro-esophageal reflux disease without esophagitis: Secondary | ICD-10-CM | POA: Diagnosis not present

## 2017-08-09 DIAGNOSIS — I10 Essential (primary) hypertension: Secondary | ICD-10-CM | POA: Diagnosis not present

## 2017-08-09 DIAGNOSIS — Z7189 Other specified counseling: Secondary | ICD-10-CM

## 2017-08-09 DIAGNOSIS — Z23 Encounter for immunization: Secondary | ICD-10-CM | POA: Diagnosis not present

## 2017-08-09 DIAGNOSIS — Z0000 Encounter for general adult medical examination without abnormal findings: Secondary | ICD-10-CM | POA: Diagnosis not present

## 2017-08-09 DIAGNOSIS — N3941 Urge incontinence: Secondary | ICD-10-CM

## 2017-08-09 DIAGNOSIS — M353 Polymyalgia rheumatica: Secondary | ICD-10-CM

## 2017-08-09 LAB — COMPREHENSIVE METABOLIC PANEL
ALBUMIN: 4.1 g/dL (ref 3.5–5.2)
ALK PHOS: 61 U/L (ref 39–117)
ALT: 15 U/L (ref 0–35)
AST: 19 U/L (ref 0–37)
BUN: 19 mg/dL (ref 6–23)
CO2: 28 mEq/L (ref 19–32)
Calcium: 10 mg/dL (ref 8.4–10.5)
Chloride: 103 mEq/L (ref 96–112)
Creatinine, Ser: 0.92 mg/dL (ref 0.40–1.20)
GFR: 60.74 mL/min (ref >60.00)
Glucose, Bld: 102 mg/dL — ABNORMAL HIGH (ref 70–99)
Potassium: 4.6 mEq/L (ref 3.5–5.1)
Sodium: 138 mEq/L (ref 135–145)
TOTAL PROTEIN: 7 g/dL (ref 6.0–8.3)
Total Bilirubin: 0.4 mg/dL (ref 0.2–1.2)

## 2017-08-09 LAB — CBC
HCT: 38.8 % (ref 36.0–46.0)
Hemoglobin: 13.3 g/dL (ref 12.0–15.0)
MCHC: 34.4 g/dL (ref 30.0–36.0)
MCV: 91.8 fl (ref 78.0–100.0)
Platelets: 237 10*3/uL (ref 150.0–400.0)
RBC: 4.22 Mil/uL (ref 3.87–5.11)
RDW: 12.9 % (ref 11.5–15.5)
WBC: 5.4 10*3/uL (ref 4.0–10.5)

## 2017-08-09 LAB — SEDIMENTATION RATE: Sed Rate: 15 mm/hr (ref 0–30)

## 2017-08-09 LAB — T4, FREE: FREE T4: 1.11 ng/dL (ref 0.60–1.60)

## 2017-08-09 NOTE — Progress Notes (Signed)
Subjective:    [name], female    DOB: November 12, 1923, 84 y.o.   MRN: [id number]  HPI Here for Medicare wellness and follow up of chronic health conditions Reviewed form and advanced directives Reviewed other doctors No tobacco Still enjoys 1-2 glasses of wine daily Exercises regularly--water aerobics Vision fine Wears hearing aides No falls No depression or anhedonia Independent with instrumental ADLs--just drives locally No sig memory problems  Feels her age at times Generally doing well Will nap if sitting in chair in the afternoon Not able to do as much visiting as in the past  Has bladder issues---some urgency Frequent nocturia--but goes back to sleep  No clear muscle aching  No apparent PMR symptoms still off meds Mild arthritis pain only  No chest pain No SOB No syncope---mild AM dizziness at times No headaches No edema  Heartburn controlled with omeprazole No dysphagia Hasn't tolerated weaning  Current Outpatient Medications on File Prior to Visit  Medication Sig Dispense Refill  . losartan-hydrochlorothiazide (HYZAAR) 50-12.5 MG tablet TAKE 1 TABLET EVERY DAY 90 tablet 3  . Multiple Vitamin (MULTIVITAMIN) capsule Take 1 capsule by mouth daily.      . Naproxen Sod-diphenhydrAMINE (ALEVE PM) 220-25 MG TABS Take 1 tablet by mouth at bedtime as needed.    . Naproxen Sodium 220 MG CAPS Take 1 capsule by mouth 2 (two) times daily as needed.    Marland Kitchen omeprazole (PRILOSEC) 20 MG capsule Take 20 mg by mouth daily.      Marland Kitchen OVER THE COUNTER MEDICATION Curcumin 2K Take 1 Daily     No current facility-administered medications on file prior to visit.     Allergies  Allergen Reactions  . Alendronate Sodium     REACTION: GI    Past Medical History:  Diagnosis Date  . Diverticulosis   . GERD (gastroesophageal reflux disease)   . Hyperlipidemia   . Hypertension   . Osteoporosis   . Polymyalgia rheumatica (HCC)     Past Surgical History:    Procedure Laterality Date  . ANKLE FRACTURE SURGERY  07/12/2005  . CATARACT EXTRACTION  2012   right  . TONSILLECTOMY      Family History  Problem Relation Age of Onset  . Arthritis Mother   . Heart disease Father 36  . Diabetes Brother   . Cancer Sister        ovarian  . Stroke Sister   . Heart disease Daughter   . Rheum arthritis Daughter     Social History   Socioeconomic History  . Marital status: Widowed    Spouse name: Not on file  . Number of children: 2  . Years of education: Not on file  . Highest education level: Not on file  Occupational History  . Occupation: House wife, farming  Social Needs  . Financial resource strain: Not on file  . Food insecurity:    Worry: Not on file    Inability: Not on file  . Transportation needs:    Medical: Not on file    Non-medical: Not on file  Tobacco Use  . Smoking status: Never Smoker  . Smokeless tobacco: Never Used  Substance and Sexual Activity  . Alcohol use: Yes    Alcohol/week: 0.0 oz    Comment: wine  . Drug use: No  . Sexual activity: Not on file  Lifestyle  . Physical activity:    Days per week: Not on file    Minutes per session: Not  on file  . Stress: Not on file  Relationships  . Social connections:    Talks on phone: Not on file    Gets together: Not on file    Attends religious service: Not on file    Active member of club or organization: Not on file    Attends meetings of clubs or organizations: Not on file    Relationship status: Not on file  . Intimate partner violence:    Fear of current or ex partner: Not on file    Emotionally abused: Not on file    Physically abused: Not on file    Forced sexual activity: Not on file  Other Topics Concern  . Not on file  Social History Narrative   Has living will   1 living daughter is  health care POA.     Has DNR already   Request no feeding tube.  Does not want extended ventilator support.   Review of Systems Appetite is fine Weight is  stable Sleeps well Wears seat belt Lots of dental work in past year---upper and lower partials No recent dermatologist visit---lots of chronic moles Bowels are fine. No blood No significant back pain. Some trouble with hands--like opening jars, etc    Objective:   Physical Exam  Constitutional: She is oriented to person, place, and time. She appears well-developed. No distress.  HENT:  Mouth/Throat: Oropharynx is clear and moist. No oropharyngeal exudate.  Cerumen in canals--- R>>L. Will flush  Neck: No thyromegaly present.  Cardiovascular: Normal rate, regular rhythm, normal heart sounds and intact distal pulses. Exam reveals no gallop.  No murmur heard. Respiratory: Effort normal and breath sounds normal. No respiratory distress. She has no wheezes. She has no rales.  GI: Soft. There is no tenderness.  Musculoskeletal: She exhibits no edema or tenderness.  Lymphadenopathy:    She has no cervical adenopathy.  Neurological: She is alert and oriented to person, place, and time.  President--- "Pola Corn, Clinton" [phone] D-l-r-o-w Recall 3/3  Skin: No rash noted. No erythema.  Psychiatric: She has a normal mood and affect. Her behavior is normal.           Assessment & Plan:

## 2017-08-09 NOTE — Assessment & Plan Note (Signed)
Stable Discussed no great meds

## 2017-08-09 NOTE — Progress Notes (Signed)
Hearing Screening Comments: Wears hearing aids. Wearing them today. Vision Screening Comments: March 2019

## 2017-08-09 NOTE — Assessment & Plan Note (Signed)
Quiet on PPI Hasn't tolerated wean

## 2017-08-09 NOTE — Assessment & Plan Note (Signed)
BP Readings from Last 3 Encounters:  08/09/17 138/76  05/11/16 (!) 146/78  05/06/15 (!) 138/92   Good control Due for labs

## 2017-08-09 NOTE — Assessment & Plan Note (Signed)
Still seems to be in remission Will check sed rate 

## 2017-08-09 NOTE — Addendum Note (Signed)
Addended by: Pilar Grammes on: 08/09/2017 10:41 AM   Modules accepted: Orders

## 2017-08-09 NOTE — Assessment & Plan Note (Signed)
I have personally reviewed the Medicare Annual Wellness questionnaire and have noted 1. The patient's medical and social history 2. Their use of alcohol, tobacco or illicit drugs 3. Their current medications and supplements 4. The patient's functional ability including ADL's, fall risks, home safety risks and hearing or visual             impairment. 5. Diet and physical activities 6. Evidence for depression or mood disorders  The patients weight, height, BMI and visual acuity have been recorded in the chart I have made referrals, counseling and provided education to the patient based review of the above and I have provided the pt with a written personalized care plan for preventive services.  I have provided you with a copy of your personalized plan for preventive services. Please take the time to review along with your updated medication list.  Will update pneumovax Discussed Td--she will only come in for booster if she has injury No cancer screening Yearly flu vaccine Consider shingrix

## 2017-08-09 NOTE — Assessment & Plan Note (Signed)
Has DNR 

## 2017-09-05 DIAGNOSIS — D2272 Melanocytic nevi of left lower limb, including hip: Secondary | ICD-10-CM | POA: Diagnosis not present

## 2017-09-05 DIAGNOSIS — D225 Melanocytic nevi of trunk: Secondary | ICD-10-CM | POA: Diagnosis not present

## 2017-09-05 DIAGNOSIS — X32XXXA Exposure to sunlight, initial encounter: Secondary | ICD-10-CM | POA: Diagnosis not present

## 2017-09-05 DIAGNOSIS — L570 Actinic keratosis: Secondary | ICD-10-CM | POA: Diagnosis not present

## 2017-09-05 DIAGNOSIS — D485 Neoplasm of uncertain behavior of skin: Secondary | ICD-10-CM | POA: Diagnosis not present

## 2017-09-05 DIAGNOSIS — D2271 Melanocytic nevi of right lower limb, including hip: Secondary | ICD-10-CM | POA: Diagnosis not present

## 2017-09-05 DIAGNOSIS — D2262 Melanocytic nevi of left upper limb, including shoulder: Secondary | ICD-10-CM | POA: Diagnosis not present

## 2017-09-05 DIAGNOSIS — L821 Other seborrheic keratosis: Secondary | ICD-10-CM | POA: Diagnosis not present

## 2017-09-05 DIAGNOSIS — D2261 Melanocytic nevi of right upper limb, including shoulder: Secondary | ICD-10-CM | POA: Diagnosis not present

## 2017-10-11 DIAGNOSIS — M3501 Sicca syndrome with keratoconjunctivitis: Secondary | ICD-10-CM | POA: Diagnosis not present

## 2017-11-20 ENCOUNTER — Telehealth: Payer: Self-pay

## 2017-11-20 NOTE — Telephone Encounter (Signed)
Form done No charge 

## 2017-11-20 NOTE — Telephone Encounter (Signed)
Pt last seen annual exam on 08/09/17. Application for Disabililty parking placard in Dr Alla German in box.

## 2017-11-20 NOTE — Telephone Encounter (Signed)
Spoke to pt. Form up front 

## 2017-11-20 NOTE — Telephone Encounter (Signed)
Copied from [place] [phone]. Topic: Quick Communication - See Telephone Encounter >> Nov 20, 2017  9:11 AM Hewitt Shorts wrote: Best number [phone]  Pt is wanting to discuss getting a handi cap sign not sure if the patient needs to be seen or can this just be filled out

## 2017-12-28 DIAGNOSIS — R350 Frequency of micturition: Secondary | ICD-10-CM | POA: Diagnosis not present

## 2017-12-28 DIAGNOSIS — I10 Essential (primary) hypertension: Secondary | ICD-10-CM | POA: Diagnosis not present

## 2017-12-28 DIAGNOSIS — R399 Unspecified symptoms and signs involving the genitourinary system: Secondary | ICD-10-CM | POA: Diagnosis not present

## 2017-12-28 DIAGNOSIS — N3001 Acute cystitis with hematuria: Secondary | ICD-10-CM | POA: Diagnosis not present

## 2017-12-28 DIAGNOSIS — N814 Uterovaginal prolapse, unspecified: Secondary | ICD-10-CM | POA: Diagnosis not present

## 2018-01-04 ENCOUNTER — Telehealth: Payer: Self-pay

## 2018-01-04 NOTE — Telephone Encounter (Signed)
[facility] is part of [facility]. The notes are available in Care Everywhere. Spoke to pt's daughter to let her know.

## 2018-01-04 NOTE — Telephone Encounter (Signed)
Copied from [place] [phone]. Topic: General - Inquiry >> Jan 03, 2018  3:17 PM Vernona Rieger wrote: Reason for CRM: Patient's daughter, Denver Faster called and said that she has an appointment with Dr Silvio Pate this Friday, October 25th. She was seen at the emergency room in [facility] and they can not find the discharge papers. She wants to know could someone fax a request to get those records from that hospital. Fax Number is [phone], patient is still coming to the appointment with her daughter. Her daughter can be reached at [phone]

## 2018-01-05 ENCOUNTER — Encounter: Payer: Self-pay | Admitting: Internal Medicine

## 2018-01-05 ENCOUNTER — Ambulatory Visit (INDEPENDENT_AMBULATORY_CARE_PROVIDER_SITE_OTHER): Payer: Medicare HMO | Admitting: Internal Medicine

## 2018-01-05 VITALS — BP 122/76 | HR 71 | Temp 97.6°F | Ht 59.5 in | Wt 158.0 lb

## 2018-01-05 DIAGNOSIS — M353 Polymyalgia rheumatica: Secondary | ICD-10-CM | POA: Diagnosis not present

## 2018-01-05 DIAGNOSIS — N390 Urinary tract infection, site not specified: Secondary | ICD-10-CM

## 2018-01-05 DIAGNOSIS — M159 Polyosteoarthritis, unspecified: Secondary | ICD-10-CM | POA: Diagnosis not present

## 2018-01-05 MED ORDER — NITROFURANTOIN MONOHYD MACRO 100 MG PO CAPS: 100.0000 mg | ORAL_CAPSULE | Freq: Two times a day (BID) | ORAL | 0 refills | Status: DC

## 2018-01-05 NOTE — Patient Instructions (Signed)
Stop the cephalexin. Start the nitrofurantoin with 2 doses today. If you feel better by tomorrow, you only need to take it for 3 days (otherwise take the full 7 days). Try tylenol 650-1000mg  three times a day for the arthritis. See if that allows you to use the aleve less frequently.

## 2018-01-05 NOTE — Assessment & Plan Note (Signed)
Discussed concerns about the aleve Will see if tylenol works instead

## 2018-01-05 NOTE — Assessment & Plan Note (Addendum)
Had Staph on culture---cephalexin probably not the best choice Mild persistent symptoms Doubt recurrence of PMR--but will recheck labs if not better

## 2018-01-05 NOTE — Progress Notes (Signed)
Subjective:    [name], female    DOB: 17-Jul-1923, 84 y.o.   MRN: [id number]  HPI Here with niece due to urinary symptoms Started with back ache and moved around---felt "puny". About 3 weeks ago Then had urinary symptoms--frequency was worse, some burning dysuria No hematuria or fever [place]sent to ER due to high BP Did have abnormal urine---and culture sent  Given keflex--- 8 days ago. Almost done  Still doesn't feel right---"washed out" Tires easily and napping more  Still with same frequency Not painful  Current Outpatient Medications on File Prior to Visit  Medication Sig Dispense Refill  . cephALEXin (KEFLEX) 250 MG capsule Take 2 capsules by mouth 2 (two) times daily.    Marland Kitchen losartan-hydrochlorothiazide (HYZAAR) 50-12.5 MG tablet TAKE 1 TABLET EVERY DAY 90 tablet 3  . Multiple Vitamin (MULTIVITAMIN) capsule Take 1 capsule by mouth daily.      . Naproxen Sod-diphenhydrAMINE (ALEVE PM) 220-25 MG TABS Take 1 tablet by mouth at bedtime as needed.    . Naproxen Sodium 220 MG CAPS Take 1 capsule by mouth 2 (two) times daily as needed.    Marland Kitchen omeprazole (PRILOSEC) 20 MG capsule Take 20 mg by mouth daily.      Marland Kitchen OVER THE COUNTER MEDICATION Curcumin 2K Take 1 Daily     No current facility-administered medications on file prior to visit.     Allergies  Allergen Reactions  . Alendronate Sodium     REACTION: GI    Past Medical History:  Diagnosis Date  . Diverticulosis   . GERD (gastroesophageal reflux disease)   . Hyperlipidemia   . Hypertension   . Osteoporosis   . Polymyalgia rheumatica (HCC)     Past Surgical History:  Procedure Laterality Date  . ANKLE FRACTURE SURGERY  07/12/2005  . CATARACT EXTRACTION  2012   right  . TONSILLECTOMY      Family History  Problem Relation Age of Onset  . Arthritis Mother   . Heart disease Father 29  . Diabetes Brother   . Cancer Sister        ovarian  . Stroke Sister    . Heart disease Daughter   . Rheum arthritis Daughter     Social History   Socioeconomic History  . Marital status: Widowed    Spouse name: Not on file  . Number of children: 2  . Years of education: Not on file  . Highest education level: Not on file  Occupational History  . Occupation: House wife, farming  Social Needs  . Financial resource strain: Not on file  . Food insecurity:    Worry: Not on file    Inability: Not on file  . Transportation needs:    Medical: Not on file    Non-medical: Not on file  Tobacco Use  . Smoking status: Never Smoker  . Smokeless tobacco: Never Used  Substance and Sexual Activity  . Alcohol use: Yes    Alcohol/week: 0.0 standard drinks    Comment: wine  . Drug use: No  . Sexual activity: Not on file  Lifestyle  . Physical activity:    Days per week: Not on file    Minutes per session: Not on file  . Stress: Not on file  Relationships  . Social connections:    Talks on phone: Not on file    Gets together: Not on file    Attends religious service: Not on  file    Active member of club or organization: Not on file    Attends meetings of clubs or organizations: Not on file    Relationship status: Not on file  . Intimate partner violence:    Fear of current or ex partner: Not on file    Emotionally abused: Not on file    Physically abused: Not on file    Forced sexual activity: Not on file  Other Topics Concern  . Not on file  Social History Narrative   Has living will   1 living daughter is  health care POA.     Has DNR already   Request no feeding tube.  Does not want extended ventilator support.   Review of Systems  No fever No N/V Stools were slow for a few days--then soft for 2 days after Appetite is off Sleeps okay with aleve PM Takes aleve daily---at least 4     Objective:   Physical Exam  Constitutional: No distress.  GI: Soft. She exhibits no distension. There is no tenderness. There is no rebound and no  guarding.  Musculoskeletal:  No CVA tenderness           Assessment & Plan:

## 2018-01-05 NOTE — Assessment & Plan Note (Signed)
Will recheck ESR if not better with the different antibiotic

## 2018-04-30 ENCOUNTER — Ambulatory Visit (INDEPENDENT_AMBULATORY_CARE_PROVIDER_SITE_OTHER): Payer: Medicare HMO | Admitting: Internal Medicine

## 2018-04-30 ENCOUNTER — Encounter: Payer: Self-pay | Admitting: Internal Medicine

## 2018-04-30 VITALS — BP 136/80 | HR 83 | Temp 97.6°F | Ht 59.5 in | Wt 157.5 lb

## 2018-04-30 DIAGNOSIS — N814 Uterovaginal prolapse, unspecified: Secondary | ICD-10-CM | POA: Diagnosis not present

## 2018-04-30 NOTE — Patient Instructions (Signed)
Please start miralax--1 capful with a full glass of water daily. You should hear from the gynecology group ([facility] in [place]) soon --about the appointment

## 2018-04-30 NOTE — Assessment & Plan Note (Addendum)
Seems to be causing urinary retention (fortunately no infection currently) This needs to be addressed urgently Healthy despite age---so could be considered for hysterectomy Unclear whether pessary would even be a consideration Will set up ASAP with gyn  Will ask her to start miralax daily---since constipation may be a factor as well

## 2018-04-30 NOTE — Progress Notes (Signed)
Subjective:    [name], female    DOB: September 21, 1923, 85 y.o.   MRN: [id number]  HPI Here due to worsening vaginal prolapse If is more bothersome Will have frequency---as much as 12 times in 12 hours Up 5-6 hours at night Has to push to get the urine out Not much leakage--just trouble getting the flow to come out Not sure if she is emptying well  No fever No hematuria or dysuria  Current Outpatient Medications on File Prior to Visit  Medication Sig Dispense Refill  . acetaminophen (TYLENOL) 325 MG tablet Take 650 mg by mouth every 6 (six) hours as needed.    . diphenhydramine-acetaminophen (TYLENOL PM EXTRA STRENGTH) 25-500 MG TABS tablet Take 1 tablet by mouth at bedtime as needed.    Marland Kitchen losartan-hydrochlorothiazide (HYZAAR) 50-12.5 MG tablet TAKE 1 TABLET EVERY DAY 90 tablet 3  . Multiple Vitamin (MULTIVITAMIN) capsule Take 1 capsule by mouth daily.      . Naproxen Sodium 220 MG CAPS Take 1 capsule by mouth 2 (two) times daily as needed.    Marland Kitchen omeprazole (PRILOSEC) 20 MG capsule Take 20 mg by mouth daily.      Marland Kitchen OVER THE COUNTER MEDICATION Curcumin 2K Take 1 Daily    . Naproxen Sod-diphenhydrAMINE (ALEVE PM) 220-25 MG TABS Take 1 tablet by mouth at bedtime as needed.     No current facility-administered medications on file prior to visit.     Allergies  Allergen Reactions  . Alendronate Sodium     REACTION: GI    Past Medical History:  Diagnosis Date  . Diverticulosis   . GERD (gastroesophageal reflux disease)   . Hyperlipidemia   . Hypertension   . Osteoporosis   . Polymyalgia rheumatica (HCC)     Past Surgical History:  Procedure Laterality Date  . ANKLE FRACTURE SURGERY  07/12/2005  . CATARACT EXTRACTION  2012   right  . TONSILLECTOMY      Family History  Problem Relation Age of Onset  . Arthritis Mother   . Heart disease Father 55  . Diabetes Brother   . Cancer Sister        ovarian  . Stroke Sister   . Heart disease Daughter   .  Rheum arthritis Daughter     Social History   Socioeconomic History  . Marital status: Widowed    Spouse name: Not on file  . Number of children: 2  . Years of education: Not on file  . Highest education level: Not on file  Occupational History  . Occupation: House wife, farming  Social Needs  . Financial resource strain: Not on file  . Food insecurity:    Worry: Not on file    Inability: Not on file  . Transportation needs:    Medical: Not on file    Non-medical: Not on file  Tobacco Use  . Smoking status: Never Smoker  . Smokeless tobacco: Never Used  Substance and Sexual Activity  . Alcohol use: Yes    Alcohol/week: 0.0 standard drinks    Comment: wine  . Drug use: No  . Sexual activity: Not on file  Lifestyle  . Physical activity:    Days per week: Not on file    Minutes per session: Not on file  . Stress: Not on file  Relationships  . Social connections:    Talks on phone: Not on file    Gets together: Not on file    Attends religious  service: Not on file    Active member of club or organization: Not on file    Attends meetings of clubs or organizations: Not on file    Relationship status: Not on file  . Intimate partner violence:    Fear of current or ex partner: Not on file    Emotionally abused: Not on file    Physically abused: Not on file    Forced sexual activity: Not on file  Other Topics Concern  . Not on file  Social History Narrative   Has living will   1 living daughter is  health care POA.     Has DNR already   Request no feeding tube.  Does not want extended ventilator support.   Review of Systems Doesn't feel she is overly constipated---goes every 2 days now Some trouble though---she feels it might get worse (but doesn't think that is causing the problem    Objective:   Physical Exam  Constitutional: She appears well-developed. No distress.  GI: Soft. She exhibits no distension. There is no abdominal tenderness. There is no rebound and  no guarding.  Significant suprapubic dullness  Genitourinary:    Genitourinary Comments: Significant uterine prolapse---cervix and about 4cm more of uterus. Easily reduced into vaginal without pain or resistance            Assessment & Plan:

## 2018-05-02 ENCOUNTER — Ambulatory Visit (INDEPENDENT_AMBULATORY_CARE_PROVIDER_SITE_OTHER): Payer: Medicare HMO | Admitting: Obstetrics & Gynecology

## 2018-05-02 ENCOUNTER — Encounter: Payer: Self-pay | Admitting: Obstetrics & Gynecology

## 2018-05-02 VITALS — BP 140/80 | Ht 60.0 in | Wt 156.0 lb

## 2018-05-02 DIAGNOSIS — R339 Retention of urine, unspecified: Secondary | ICD-10-CM

## 2018-05-02 DIAGNOSIS — N814 Uterovaginal prolapse, unspecified: Secondary | ICD-10-CM

## 2018-05-02 NOTE — Progress Notes (Signed)
CONSULTANT- Dr Silvio Pate CONSULTATION REASON- Prolapse  Patient complains of a complete uterine prolapse. Problem started 2 months ago. Symptoms include: prolapse of tissue with straining, discomfort: mild and incomplete bladder emptying.  She has in fact had bladder sx's for over a year, but feels the prolapse has occured more outwardly these past 2 mos.  She is active.. Symptoms have gradually worsened.  One UTI (no recurrent sx's).  She has had some constipation but this has been treated.   PMHx: She  has a past medical history of Diverticulosis, GERD (gastroesophageal reflux disease), Hyperlipidemia, Hypertension, Osteoporosis, and Polymyalgia rheumatica ([place]). Also,  has a past surgical history that includes Tonsillectomy; Ankle fracture surgery (07/12/2005); and Cataract extraction (2012)., family history includes Arthritis in her mother; Cancer in her sister; Diabetes in her brother; Heart disease in her daughter; Heart disease (age of onset: 55) in her father; Rheum arthritis in her daughter; Stroke in her sister.,  reports that she has never smoked. She has never used smokeless tobacco. She reports current alcohol use. She reports that she does not use drugs.  She has a current medication list which includes the following prescription(s): acetaminophen, diphenhydramine-acetaminophen, losartan-hydrochlorothiazide, multivitamin, naproxen sod-diphenhydramine, naproxen sodium, omeprazole, and OVER THE COUNTER MEDICATION. Also, is allergic to alendronate sodium.  Review of Systems  Constitutional: Negative for chills, fever and malaise/fatigue.  HENT: Negative for congestion, sinus pain and sore throat.   Eyes: Negative for blurred vision and pain.  Respiratory: Negative for cough and wheezing.   Cardiovascular: Negative for chest pain and leg swelling.  Gastrointestinal: Negative for abdominal pain, constipation, diarrhea, heartburn, nausea and vomiting.  Genitourinary: Negative for dysuria,  frequency, hematuria and urgency.  Musculoskeletal: Negative for back pain, joint pain, myalgias and neck pain.  Skin: Negative for itching and rash.  Neurological: Negative for dizziness, tremors and weakness.  Endo/Heme/Allergies: Does not bruise/bleed easily.  Psychiatric/Behavioral: Negative for depression. The patient is not nervous/anxious and does not have insomnia.     Objective: There were no vitals taken for this visit. Physical Exam Constitutional:      General: She is not in acute distress.    Appearance: She is well-developed.  Genitourinary:     Pelvic exam was performed with patient supine.     Vagina and uterus normal.     No vaginal erythema or bleeding.     No cervical motion tenderness, discharge, polyp or nabothian cyst.     Uterus is mobile.     Uterus is not enlarged.     No uterine mass detected.    Uterus is midaxial.     No right or left adnexal mass present.     Right adnexa not tender.     Left adnexa not tender.     Genitourinary Comments: Gr 4 (complete) uterine prolapse, Gr 2 cystocele  HENT:     Head: Normocephalic and atraumatic.     Nose: Nose normal.  Abdominal:     General: There is no distension.     Palpations: Abdomen is soft.     Tenderness: There is no abdominal tenderness.  Musculoskeletal: Normal range of motion.  Neurological:     Mental Status: She is alert and oriented to person, place, and time.     Cranial Nerves: No cranial nerve deficit.  Skin:    General: Skin is warm and dry.   ASSESSMENT/PLAN:   Problem List Items Addressed This Visit      Genitourinary   Uterine prolapse - Primary  Other Visit Diagnoses    Urinary retention with incomplete bladder emptying        Pessary vs surgery tx options discussed in detail today. Pessary fitting today    Will order ring #3  Pessary Fitting Patient presents for a pessary fitting. She desires a pessary as her means of controlling her symptoms of prolapse and/or urinary  incontinence. She understands the care needed for a pessary and desires to proceed. Alternative treatment options have been discussed at length and the patient voices an understanding of each option.   PROCEDURE: The patient was placed in dorsal lithotomy position. Examination confirmed prolapse. A #2 and 3 ring pessary was fitted without difficulty. The patient subsequently ambulated, voided and performed valsalva maneuvers without dislodging the pessary and without discomfort. Sizer removed and order for appropriate size done, she will follow up when arrives.  Barnett Applebaum, MD, [facility], [facility] 05/02/2018  8:03 AM

## 2018-05-02 NOTE — Patient Instructions (Signed)
Pelvic Organ Prolapse Pelvic organ prolapse is the stretching, bulging, or dropping of pelvic organs into an abnormal position. It happens when the muscles and tissues that surround and support pelvic structures become weak or stretched. Pelvic organ prolapse can involve the:  Vagina (vaginal prolapse).  Uterus (uterine prolapse).  Bladder (cystocele).  Rectum (rectocele).  Intestines (enterocele). When organs other than the vagina are involved, they often bulge into the vagina or protrude from the vagina, depending on how severe the prolapse is. What are the causes? This condition may be caused by:  Pregnancy, labor, and childbirth.  Past pelvic surgery.  Decreased production of the hormone estrogen associated with menopause.  Consistently lifting more than 50 lb (23 kg).  Obesity.  Long-term inability to pass stool (chronic constipation).  A cough that lasts a long time (chronic).  Buildup of fluid in the abdomen due to certain diseases and other conditions. What are the signs or symptoms? Symptoms of this condition include:  Passing a little urine (loss of bladder control) when you cough, sneeze, strain, and exercise (stress incontinence). This may be worse immediately after childbirth. It may gradually improve over time.  Feeling pressure in your pelvis or vagina. This pressure may increase when you cough or when you are passing stool.  A bulge that protrudes from the opening of your vagina.  Difficulty passing urine or stool.  Pain in your lower back.  Pain, discomfort, or disinterest in sex.  Repeated bladder infections (urinary tract infections).  Difficulty inserting a tampon. In some people, this condition causes no symptoms. How is this diagnosed? This condition may be diagnosed based on a vaginal and rectal exam. During the exam, you may be asked to cough and strain while you are lying down, sitting, and standing up. Your health care provider will  determine if other tests are required, such as bladder function tests. How is this treated? Treatment for this condition may depend on your symptoms. Treatment may include:  Lifestyle changes, such as changes to your diet.  Emptying your bladder at scheduled times (bladder training therapy). This can help reduce or avoid urinary incontinence.  Estrogen. Estrogen may help mild prolapse by increasing the strength and tone of pelvic floor muscles.  Kegel exercises. These may help mild cases of prolapse by strengthening and tightening the muscles of the pelvic floor.  A soft, flexible device that helps support the vaginal walls and keep pelvic organs in place (pessary). This is inserted into your vagina by your health care provider.  Surgery. This is often the only form of treatment for severe prolapse. Follow these instructions at home:  Avoid drinking beverages that contain caffeine or alcohol.  Increase your intake of high-fiber foods. This can help decrease constipation and straining during bowel movements.  Lose weight if recommended by your health care provider.  Wear a sanitary pad or adult diapers if you have urinary incontinence.  Avoid heavy lifting and straining with exercise and work. Do not hold your breath when you perform mild to moderate lifting and exercise activities. Limit your activities as directed by your health care provider.  Do Kegel exercises as directed by your health care provider. To do this: ? Squeeze your pelvic floor muscles tight. You should feel a tight lift in your rectal area and a tightness in your vaginal area. Keep your stomach, buttocks, and legs relaxed. ? Hold the muscles tight for up to 10 seconds. ? Relax your muscles. ? Repeat this exercise 50 times a day,   or as many times as told by your health care provider. Continue to do this exercise for at least 4-6 weeks, or for as long as told by your health care provider.  Take over-the-counter and  prescription medicines only as told by your health care provider.  If you have a pessary, take care of it as told by your health care provider.  Keep all follow-up visits as told by your health care provider. This is important. Contact a health care provider if you:  Have symptoms that interfere with your daily activities or sex life.  Need medicine to help with the discomfort.  Notice bleeding from your vagina that is not related to your period.  Have a fever.  Have pain or bleeding when you urinate.  Have bleeding when you pass stool.  Pass urine when you have sex.  Have chronic constipation.  Have a pessary that falls out.  Have bad smelling vaginal discharge.  Have an unusual, low pain in your abdomen. Summary  Pelvic organ prolapse is the stretching, bulging, or dropping of pelvic organs into an abnormal position. It happens when the muscles and tissues that surround and support pelvic structures become weak or stretched.  When organs other than the vagina are involved, they often bulge into the vagina or protrude from the vagina, depending on how severe the prolapse is.  In most cases, this condition needs to be treated only if it produces symptoms. Treatment may include lifestyle changes, estrogen, Kegel exercises, pessary insertion, or surgery.  Avoid heavy lifting and straining with exercise and work. Do not hold your breath when you perform mild to moderate lifting and exercise activities. Limit your activities as directed by your health care provider. This information is not intended to replace advice given to you by your health care provider. Make sure you discuss any questions you have with your health care provider. Document Released: 09/25/2013 Document Revised: 03/22/2017 Document Reviewed: 03/22/2017 [employer] Interactive Patient Education  2019 [employer]  

## 2018-05-24 ENCOUNTER — Encounter: Payer: Self-pay | Admitting: Obstetrics & Gynecology

## 2018-05-24 ENCOUNTER — Other Ambulatory Visit: Payer: Self-pay

## 2018-05-24 ENCOUNTER — Ambulatory Visit: Payer: Medicare HMO | Admitting: Obstetrics & Gynecology

## 2018-05-24 VITALS — BP 150/80 | Ht 60.0 in | Wt 157.0 lb

## 2018-05-24 DIAGNOSIS — R339 Retention of urine, unspecified: Secondary | ICD-10-CM | POA: Diagnosis not present

## 2018-05-24 DIAGNOSIS — N814 Uterovaginal prolapse, unspecified: Secondary | ICD-10-CM

## 2018-05-24 NOTE — Patient Instructions (Signed)
Pelvic Organ Prolapse Pelvic organ prolapse is the stretching, bulging, or dropping of pelvic organs into an abnormal position. It happens when the muscles and tissues that surround and support pelvic structures become weak or stretched. Pelvic organ prolapse can involve the:  Vagina (vaginal prolapse).  Uterus (uterine prolapse).  Bladder (cystocele).  Rectum (rectocele).  Intestines (enterocele). When organs other than the vagina are involved, they often bulge into the vagina or protrude from the vagina, depending on how severe the prolapse is. What are the causes? This condition may be caused by:  Pregnancy, labor, and childbirth.  Past pelvic surgery.  Decreased production of the hormone estrogen associated with menopause.  Consistently lifting more than 50 lb (23 kg).  Obesity.  Long-term inability to pass stool (chronic constipation).  A cough that lasts a long time (chronic).  Buildup of fluid in the abdomen due to certain diseases and other conditions. What are the signs or symptoms? Symptoms of this condition include:  Passing a little urine (loss of bladder control) when you cough, sneeze, strain, and exercise (stress incontinence). This may be worse immediately after childbirth. It may gradually improve over time.  Feeling pressure in your pelvis or vagina. This pressure may increase when you cough or when you are passing stool.  A bulge that protrudes from the opening of your vagina.  Difficulty passing urine or stool.  Pain in your lower back.  Pain, discomfort, or disinterest in sex.  Repeated bladder infections (urinary tract infections).  Difficulty inserting a tampon. In some people, this condition causes no symptoms. How is this diagnosed? This condition may be diagnosed based on a vaginal and rectal exam. During the exam, you may be asked to cough and strain while you are lying down, sitting, and standing up. Your health care provider will  determine if other tests are required, such as bladder function tests. How is this treated? Treatment for this condition may depend on your symptoms. Treatment may include:  Lifestyle changes, such as changes to your diet.  Emptying your bladder at scheduled times (bladder training therapy). This can help reduce or avoid urinary incontinence.  Estrogen. Estrogen may help mild prolapse by increasing the strength and tone of pelvic floor muscles.  Kegel exercises. These may help mild cases of prolapse by strengthening and tightening the muscles of the pelvic floor.  A soft, flexible device that helps support the vaginal walls and keep pelvic organs in place (pessary). This is inserted into your vagina by your health care provider.  Surgery. This is often the only form of treatment for severe prolapse. Follow these instructions at home:  Avoid drinking beverages that contain caffeine or alcohol.  Increase your intake of high-fiber foods. This can help decrease constipation and straining during bowel movements.  Lose weight if recommended by your health care provider.  Wear a sanitary pad or adult diapers if you have urinary incontinence.  Avoid heavy lifting and straining with exercise and work. Do not hold your breath when you perform mild to moderate lifting and exercise activities. Limit your activities as directed by your health care provider.  Do Kegel exercises as directed by your health care provider. To do this: ? Squeeze your pelvic floor muscles tight. You should feel a tight lift in your rectal area and a tightness in your vaginal area. Keep your stomach, buttocks, and legs relaxed. ? Hold the muscles tight for up to 10 seconds. ? Relax your muscles. ? Repeat this exercise 50 times a day,  or as many times as told by your health care provider. Continue to do this exercise for at least 4-6 weeks, or for as long as told by your health care provider.  Take over-the-counter and  prescription medicines only as told by your health care provider.  If you have a pessary, take care of it as told by your health care provider.  Monitor for vaginal bleeding or discharge; or pain in the vagina or pelvis  Keep all follow-up visits as told by your health care provider. This is important. Contact a health care provider if you:  Have symptoms that interfere with your daily activities or sex life.  Need medicine to help with the discomfort.  Notice bleeding from your vagina that is not related to your period.  Have a fever.  Have pain or bleeding when you urinate.  Have bleeding when you pass stool.  Pass urine when you have sex.  Have chronic constipation.  Have a pessary that falls out.  Have bad smelling vaginal discharge.  Have an unusual, low pain in your abdomen. Summary  Pelvic organ prolapse is the stretching, bulging, or dropping of pelvic organs into an abnormal position. It happens when the muscles and tissues that surround and support pelvic structures become weak or stretched.  When organs other than the vagina are involved, they often bulge into the vagina or protrude from the vagina, depending on how severe the prolapse is.  In most cases, this condition needs to be treated only if it produces symptoms. Treatment may include lifestyle changes, estrogen, Kegel exercises, pessary insertion, or surgery.  Avoid heavy lifting and straining with exercise and work. Do not hold your breath when you perform mild to moderate lifting and exercise activities. Limit your activities as directed by your health care provider. This information is not intended to replace advice given to you by your health care provider. Make sure you discuss any questions you have with your health care provider. Document Released: 09/25/2013 Document Revised: 03/22/2017 Document Reviewed: 03/22/2017 Elsevier Interactive Patient Education  2019 [employer].

## 2018-05-24 NOTE — Progress Notes (Signed)
HPI:      [name] is a 85 y.o. [id number] who presents today for her pessary placement and examination related to her pelvic floor weakening.  She has complete uterine prolapse to the outside of her vagina and has to push up to help void at times.  Pressure sensation.  She also reports urinary urgencey and nocturia.  PMHx: She  has a past medical history of Diverticulosis, GERD (gastroesophageal reflux disease), Hyperlipidemia, Hypertension, Osteoporosis, and Polymyalgia rheumatica ([place]). Also,  has a past surgical history that includes Tonsillectomy; Ankle fracture surgery (07/12/2005); and Cataract extraction (2012)., family history includes Arthritis in her mother; Cancer in her sister; Diabetes in her brother; Heart disease in her daughter; Heart disease (age of onset: 63) in her father; Rheum arthritis in her daughter; Stroke in her sister.,  reports that she has never smoked. She has never used smokeless tobacco. She reports current alcohol use. She reports that she does not use drugs.  She has a current medication list which includes the following prescription(s): acetaminophen, diphenhydramine-acetaminophen, losartan-hydrochlorothiazide, multivitamin, naproxen sod-diphenhydramine, naproxen sodium, omeprazole, and OVER THE COUNTER MEDICATION. Also, is allergic to alendronate sodium.  Review of Systems  Constitutional: Negative for chills, fever and malaise/fatigue.  HENT: Negative for congestion, sinus pain and sore throat.   Eyes: Negative for blurred vision and pain.  Respiratory: Negative for cough and wheezing.   Cardiovascular: Negative for chest pain and leg swelling.  Gastrointestinal: Negative for abdominal pain, constipation, diarrhea, heartburn, nausea and vomiting.  Genitourinary: Negative for dysuria, frequency, hematuria and urgency.  Musculoskeletal: Negative for back pain, joint pain, myalgias and neck pain.  Skin: Negative for itching and rash.  Neurological:  Negative for dizziness, tremors and weakness.  Endo/Heme/Allergies: Does not bruise/bleed easily.  Psychiatric/Behavioral: Negative for depression. The patient is not nervous/anxious and does not have insomnia.   All other systems reviewed and are negative.  Objective: BP (!) 150/80   Ht 5' (1.524 m)   Wt 157 lb (71.2 kg)   BMI 30.66 kg/m  Physical Exam Constitutional:      General: She is not in acute distress.    Appearance: She is well-developed.  Genitourinary:     Pelvic exam was performed with patient supine.     Vagina and uterus normal.     No vaginal erythema or bleeding.     No cervical motion tenderness, discharge, polyp or nabothian cyst.     Uterus is mobile.     Uterus is not enlarged.     No uterine mass detected.    Uterus is midaxial.     No right or left adnexal mass present.     Right adnexa not tender.     Left adnexa not tender.     Genitourinary Comments: Gr 4 uterine prolapse w G2 cystocele  HENT:     Head: Normocephalic and atraumatic.     Nose: Nose normal.  Abdominal:     General: There is no distension.     Palpations: Abdomen is soft.     Tenderness: There is no abdominal tenderness.  Musculoskeletal: Normal range of motion.  Neurological:     Mental Status: She is alert and oriented to person, place, and time.     Cranial Nerves: No cranial nerve deficit.  Skin:    General: Skin is warm and dry.   A/P:1. Uterine prolapse 2. Urinary retention with incomplete bladder emptying Pessary RING #3 was initially placed today. Instructions given for care. Concerning symptoms  to observe for are counseled to patient. Follow up scheduled for 1 months.  Consider further discussion regarding OAB if sx's persist after adequate pessary therapy  A total of 15 minutes were spent face-to-face with the patient during this encounter and over half of that time dealt with counseling and coordination of care.  Barnett Applebaum, MD, [facility], [facility] 05/24/2018  11:31 AM

## 2018-06-21 ENCOUNTER — Ambulatory Visit: Payer: Medicare HMO | Admitting: Obstetrics & Gynecology

## 2018-07-05 ENCOUNTER — Telehealth: Payer: Self-pay

## 2018-07-05 NOTE — Telephone Encounter (Signed)
Pt called triage line needing a appointment for a pessary check, pt reports some burning Almyra Free called to schedule appointment

## 2018-07-10 ENCOUNTER — Ambulatory Visit: Payer: Medicare HMO | Admitting: Obstetrics & Gynecology

## 2018-07-19 ENCOUNTER — Telehealth: Payer: Self-pay

## 2018-07-19 MED ORDER — LOSARTAN POTASSIUM-HCTZ 50-12.5 MG PO TABS: 1.0000 | ORAL_TABLET | Freq: Every day | ORAL | 0 refills | Status: DC

## 2018-07-19 NOTE — Telephone Encounter (Signed)
Pt left v/m that she received call from [facility] that needs losartan rx faxed to [phone].

## 2018-07-19 NOTE — Telephone Encounter (Signed)
Rx sent electronically.  

## 2018-08-15 ENCOUNTER — Encounter: Payer: Self-pay | Admitting: Internal Medicine

## 2018-08-15 ENCOUNTER — Ambulatory Visit (INDEPENDENT_AMBULATORY_CARE_PROVIDER_SITE_OTHER): Payer: Medicare HMO | Admitting: Internal Medicine

## 2018-08-15 DIAGNOSIS — Z0000 Encounter for general adult medical examination without abnormal findings: Secondary | ICD-10-CM | POA: Diagnosis not present

## 2018-08-15 DIAGNOSIS — N814 Uterovaginal prolapse, unspecified: Secondary | ICD-10-CM

## 2018-08-15 DIAGNOSIS — I10 Essential (primary) hypertension: Secondary | ICD-10-CM

## 2018-08-15 DIAGNOSIS — M353 Polymyalgia rheumatica: Secondary | ICD-10-CM

## 2018-08-15 DIAGNOSIS — M159 Polyosteoarthritis, unspecified: Secondary | ICD-10-CM

## 2018-08-15 DIAGNOSIS — Z7189 Other specified counseling: Secondary | ICD-10-CM

## 2018-08-15 DIAGNOSIS — K219 Gastro-esophageal reflux disease without esophagitis: Secondary | ICD-10-CM

## 2018-08-15 NOTE — Assessment & Plan Note (Addendum)
I have personally reviewed the Medicare Annual Wellness questionnaire and have noted 1. The patient's medical and social history 2. Their use of alcohol, tobacco or illicit drugs 3. Their current medications and supplements 4. The patient's functional ability including ADL's, fall risks, home safety risks and hearing or visual             impairment. 5. Diet and physical activities 6. Evidence for depression or mood disorders  The patients weight, height, BMI and visual acuity have been recorded in the chart I have made referrals, counseling and provided education to the patient based review of the above and I have provided the pt with a written personalized care plan for preventive services.  I have provided you with a copy of your personalized plan for preventive services. Please take the time to review along with your updated medication list.  No cancer screening due to age Discussed working on home exercise program for leg strengthening---can check with Legrand Como the fitness director Flu vaccine in the fall Td booster if has any injury  7 minutes spent on counseling on preventative care and reviewing wellness information

## 2018-08-15 NOTE — Assessment & Plan Note (Signed)
Nothing limiting Uses heat/tylenol mostly

## 2018-08-15 NOTE — Assessment & Plan Note (Signed)
Has it checked by RN at [facility] on occasion and it has been fine Will set up blood work

## 2018-08-15 NOTE — Assessment & Plan Note (Signed)
Has done really well with the pessary

## 2018-08-15 NOTE — Assessment & Plan Note (Signed)
Quiet on the PPI 

## 2018-08-15 NOTE — Assessment & Plan Note (Signed)
Has DNR 

## 2018-08-15 NOTE — Progress Notes (Signed)
Subjective:    [name], female    DOB: [birth year], 85 y.o.   MRN: [id number]  HPI Virtual visit for Medicare wellness and follow up of chronic health conditions  Interactive audio and video telecommunications were attempted between this provider and patient, however failed, due to patient having technical difficulties OR patient did not have access to video capability.  We continued and completed visit with audio only.   Virtual Visit via Telephone Note  I connected with Ann Odom on 08/15/18 at  8:15 AM EDT by telephone and verified that I am speaking with the correct person using two identifiers.  Location: Patient: home Provider: office   I discussed the limitations, risks, security and privacy concerns of performing an evaluation and management service by telephone and the availability of in person appointments. I also discussed with the patient that there may be a patient responsible charge related to this service. The patient expressed understanding and agreed to proceed.   History of Present Illness: No hospitalizations or surgery this year Other doctors---Dr Harris-gyn, Dr Dolores Patty, Dr Quintella Reichert, Dr Morrison Old No tobacco Still enjoys occasional glass of wine Vision is okay Hearing aides--- satisfied with this (though not great) Tries to stay active No falls No depression or anhedonia Independent with instrumental ADLs Mild changes in memory--no functional decline that anyone can tell  Still enjoying life Drives locally Stays busy despite the social distancing Did see daughter in [place] for a few days---that was nice  Had pessary put in by gyn This has worked well Due for change soon Less urinary frequency---and incontinence is better  As far as she knows--BP okay Has had checked by RN there ---has been okay No headaches Some AM dizziness---just getting up. Resolves quickly No chest pain or SOB No palpitations  Gets minor edema---like when extended time sitting in car, etc. Goes away with elevation   No specific muscle aching No fevers, etc  Still on omeprazole daily No heartburn on this No dysphagia  Current Outpatient Medications on File Prior to Visit  Medication Sig Dispense Refill  . acetaminophen (TYLENOL) 325 MG tablet Take 650 mg by mouth every 6 (six) hours as needed.    . diphenhydramine-acetaminophen (TYLENOL PM EXTRA STRENGTH) 25-500 MG TABS tablet Take 1 tablet by mouth at bedtime as needed.    Marland Kitchen losartan-hydrochlorothiazide (HYZAAR) 50-12.5 MG tablet Take 1 tablet by mouth daily. 90 tablet 0  . Multiple Vitamin (MULTIVITAMIN) capsule Take 1 capsule by mouth daily.      . Naproxen Sodium 220 MG CAPS Take 1 capsule by mouth 2 (two) times daily as needed.    Marland Kitchen omeprazole (PRILOSEC) 20 MG capsule Take 20 mg by mouth daily.      Marland Kitchen OVER THE COUNTER MEDICATION Curcumin 2K Take 1 Daily     No current facility-administered medications on file prior to visit.     Allergies  Allergen Reactions  . Alendronate Sodium     REACTION: GI    Past Medical History:  Diagnosis Date  . Diverticulosis   . GERD (gastroesophageal reflux disease)   . Hyperlipidemia   . Hypertension   . Osteoporosis   . Polymyalgia rheumatica ([facility])     Past Surgical History:  Procedure Laterality Date  . ANKLE FRACTURE SURGERY  07/12/2005  . CATARACT EXTRACTION  2012   right  . TONSILLECTOMY      Family History  Problem Relation Age of Onset  . Arthritis Mother   . Heart  disease Father 7  . Diabetes Brother   . Cancer Sister        ovarian  . Stroke Sister   . Heart disease Daughter   . Rheum arthritis Daughter     Social History   Socioeconomic History  . Marital status: Widowed    Spouse name: Not on file  . Number of children: 2  . Years of education: Not on file  . Highest education level: Not on file  Occupational History  . Occupation: House wife, farming  Social Needs  .  Financial resource strain: Not on file  . Food insecurity:    Worry: Not on file    Inability: Not on file  . Transportation needs:    Medical: Not on file    Non-medical: Not on file  Tobacco Use  . Smoking status: Never Smoker  . Smokeless tobacco: Never Used  Substance and Sexual Activity  . Alcohol use: Yes    Alcohol/week: 0.0 standard drinks    Comment: wine  . Drug use: No  . Sexual activity: Not on file  Lifestyle  . Physical activity:    Days per week: Not on file    Minutes per session: Not on file  . Stress: Not on file  Relationships  . Social connections:    Talks on phone: Not on file    Gets together: Not on file    Attends religious service: Not on file    Active member of club or organization: Not on file    Attends meetings of clubs or organizations: Not on file    Relationship status: Not on file  . Intimate partner violence:    Fear of current or ex partner: Not on file    Emotionally abused: Not on file    Physically abused: Not on file    Forced sexual activity: Not on file  Other Topics Concern  . Not on file  Social History Narrative   Has living will   1 living daughter is  health care POA.     Has DNR already   Request no feeding tube.  Does not want extended ventilator support.   ROS Using the tylenol PM fairly regularly--discussed trying to use less frequently Appetite is fine Weight is stable Wears seat belt Bowels are okay---no blood  Gets occasional back pain---uses heating pad with relief No rash or suspicious skin lesions. Lots of "moles" that haven't changed Teeth okay---recently got new partial (upper and lower)  Observations/Objective: Alert and normal interaction Oriented x 3 President-- "[employer]" [id number] D-[id number] Recall 2/3  Normal respiratory status  Assessment and Plan: See problem list  Follow Up Instructions:    I discussed the assessment and treatment plan with the patient. The  patient was provided an opportunity to ask questions and all were answered. The patient agreed with the plan and demonstrated an understanding of the instructions.   The patient was advised to call back or seek an in-person evaluation if the symptoms worsen or if the condition fails to improve as anticipated.  I provided 25 minutes of non-face-to-face time during this encounter.   Viviana Simpler, MD    Review of Systems     Objective:   Physical Exam         Assessment & Plan:

## 2018-08-15 NOTE — Assessment & Plan Note (Signed)
No recurrence of symptoms off the prednisone Will check sed rate again

## 2018-08-15 NOTE — Progress Notes (Signed)
Hearing Screening Comments: Has Hearing Aids Vision Screening Comments: February 2020

## 2018-08-17 ENCOUNTER — Other Ambulatory Visit (INDEPENDENT_AMBULATORY_CARE_PROVIDER_SITE_OTHER): Payer: Medicare HMO

## 2018-08-17 DIAGNOSIS — I10 Essential (primary) hypertension: Secondary | ICD-10-CM | POA: Diagnosis not present

## 2018-08-17 DIAGNOSIS — M353 Polymyalgia rheumatica: Secondary | ICD-10-CM | POA: Diagnosis not present

## 2018-08-17 LAB — COMPREHENSIVE METABOLIC PANEL
ALT: 13 U/L (ref 0–35)
AST: 16 U/L (ref 0–37)
Albumin: 4 g/dL (ref 3.5–5.2)
Alkaline Phosphatase: 61 U/L (ref 39–117)
BUN: 14 mg/dL (ref 6–23)
CO2: 28 mEq/L (ref 19–32)
Calcium: 10 mg/dL (ref 8.4–10.5)
Chloride: 100 mEq/L (ref 96–112)
Creatinine, Ser: 0.87 mg/dL (ref 0.40–1.20)
GFR: 60.82 mL/min (ref >60.00)
Glucose, Bld: 100 mg/dL — ABNORMAL HIGH (ref 70–99)
Potassium: 4.8 mEq/L (ref 3.5–5.1)
Sodium: 136 mEq/L (ref 135–145)
Total Bilirubin: 0.4 mg/dL (ref 0.2–1.2)
Total Protein: 6.1 g/dL (ref 6.0–8.3)

## 2018-08-17 LAB — CBC
HCT: 39.2 % (ref 36.0–46.0)
Hemoglobin: 13.2 g/dL (ref 12.0–15.0)
MCHC: 33.7 g/dL (ref 30.0–36.0)
MCV: 91.1 fl (ref 78.0–100.0)
Platelets: 204 10*3/uL (ref 150.0–400.0)
RBC: 4.3 Mil/uL (ref 3.87–5.11)
RDW: 13 % (ref 11.5–15.5)
WBC: 4.7 10*3/uL (ref 4.0–10.5)

## 2018-08-17 LAB — SEDIMENTATION RATE: Sed Rate: 13 mm/hr (ref 0–30)

## 2018-09-09 DIAGNOSIS — Z20828 Contact with and (suspected) exposure to other viral communicable diseases: Secondary | ICD-10-CM | POA: Diagnosis not present

## 2018-09-09 DIAGNOSIS — F419 Anxiety disorder, unspecified: Secondary | ICD-10-CM | POA: Diagnosis not present

## 2018-09-09 DIAGNOSIS — M19011 Primary osteoarthritis, right shoulder: Secondary | ICD-10-CM | POA: Diagnosis not present

## 2018-09-09 DIAGNOSIS — M79601 Pain in right arm: Secondary | ICD-10-CM | POA: Diagnosis not present

## 2018-09-09 DIAGNOSIS — R0602 Shortness of breath: Secondary | ICD-10-CM | POA: Diagnosis not present

## 2018-09-09 DIAGNOSIS — M40204 Unspecified kyphosis, thoracic region: Secondary | ICD-10-CM | POA: Diagnosis not present

## 2018-09-09 DIAGNOSIS — I10 Essential (primary) hypertension: Secondary | ICD-10-CM | POA: Diagnosis not present

## 2018-09-09 DIAGNOSIS — M85819 Other specified disorders of bone density and structure, unspecified shoulder: Secondary | ICD-10-CM | POA: Diagnosis not present

## 2018-09-09 DIAGNOSIS — K449 Diaphragmatic hernia without obstruction or gangrene: Secondary | ICD-10-CM | POA: Diagnosis not present

## 2018-09-09 DIAGNOSIS — E042 Nontoxic multinodular goiter: Secondary | ICD-10-CM | POA: Diagnosis not present

## 2018-09-09 DIAGNOSIS — Z049 Encounter for examination and observation for unspecified reason: Secondary | ICD-10-CM | POA: Diagnosis not present

## 2018-09-10 ENCOUNTER — Ambulatory Visit (INDEPENDENT_AMBULATORY_CARE_PROVIDER_SITE_OTHER): Payer: Medicare HMO | Admitting: Internal Medicine

## 2018-09-10 ENCOUNTER — Encounter: Payer: Self-pay | Admitting: Internal Medicine

## 2018-09-10 DIAGNOSIS — R0602 Shortness of breath: Secondary | ICD-10-CM | POA: Diagnosis not present

## 2018-09-10 DIAGNOSIS — S40011D Contusion of right shoulder, subsequent encounter: Secondary | ICD-10-CM

## 2018-09-10 DIAGNOSIS — S40011A Contusion of right shoulder, initial encounter: Secondary | ICD-10-CM | POA: Insufficient documentation

## 2018-09-10 MED ORDER — TRAMADOL HCL 50 MG PO TABS: 50.0000 mg | ORAL_TABLET | Freq: Three times a day (TID) | ORAL | 0 refills | Status: DC | PRN

## 2018-09-10 NOTE — Assessment & Plan Note (Signed)
Reviewed ER report Only diagnosis was anxiety--but that is not typical for her May be related to the pain also Extensive testing all negative

## 2018-09-10 NOTE — Assessment & Plan Note (Signed)
~  2 weeks ago X-ray negative in ER yesterday Okay to continue heat, tylenol, ibuprofen Will add tramadol for night and if bad during the day

## 2018-09-10 NOTE — Progress Notes (Signed)
Subjective:    [name], female    DOB: Mar 15, 1923, 85 y.o.   MRN: [id number]  HPI Virtual visit for ER follow up  Seen at [facility] in [place] yesterday Identification done She is at home with her daughter Cloyde Reams Reviewed billing and she gave consent  Was in [place] visiting for great grandson's birthday party She fell in her kitchen and turning and went down 2 weeks ago Bruised up her right arm and shoulder Is able to lift her arm though  Had a good time at the party Later that night--she couldn't sleep Increased pain in arm and shoulder Did try some tylenol--but it didn't really help Tried advil--- 600mg  bid and this helped more Ice packs bothered her--preferred heat  Went to ER yesterday due to SOB Panting respirations and shivering Call 911---no fever or cough Oximetry normal Taken to ER BP was high then  ER evaluation benign All labs normal EKG benign Slightly elevated d-dimer but CTA negative for PE Shoulder x-ray was negative also  Feels better today Does feel like it was her nerves  Current Outpatient Medications on File Prior to Visit  Medication Sig Dispense Refill  . acetaminophen (TYLENOL) 325 MG tablet Take 650 mg by mouth every 6 (six) hours as needed.    Marland Kitchen losartan-hydrochlorothiazide (HYZAAR) 50-12.5 MG tablet Take 1 tablet by mouth daily. 90 tablet 0  . Multiple Vitamin (MULTIVITAMIN) capsule Take 1 capsule by mouth daily.      . Naproxen Sodium 220 MG CAPS Take 1 capsule by mouth 2 (two) times daily as needed.    Marland Kitchen omeprazole (PRILOSEC) 20 MG capsule Take 20 mg by mouth daily.      Marland Kitchen OVER THE COUNTER MEDICATION Curcumin 2K Take 1 Daily    . diphenhydramine-acetaminophen (TYLENOL PM EXTRA STRENGTH) 25-500 MG TABS tablet Take 1 tablet by mouth at bedtime as needed.     No current facility-administered medications on file prior to visit.     Allergies  Allergen Reactions  . Alendronate Sodium     REACTION: GI     Past Medical History:  Diagnosis Date  . Diverticulosis   . GERD (gastroesophageal reflux disease)   . Hyperlipidemia   . Hypertension   . Osteoporosis   . Polymyalgia rheumatica ([facility])     Past Surgical History:  Procedure Laterality Date  . ANKLE FRACTURE SURGERY  07/12/2005  . CATARACT EXTRACTION  2012   right  . TONSILLECTOMY      Family History  Problem Relation Age of Onset  . Arthritis Mother   . Heart disease Father 12  . Diabetes Brother   . Cancer Sister        ovarian  . Stroke Sister   . Heart disease Daughter   . Rheum arthritis Daughter     Social History   Socioeconomic History  . Marital status: Widowed    Spouse name: Not on file  . Number of children: 2  . Years of education: Not on file  . Highest education level: Not on file  Occupational History  . Occupation: House wife, farming  Social Needs  . Financial resource strain: Not on file  . Food insecurity    Worry: Not on file    Inability: Not on file  . Transportation needs    Medical: Not on file    Non-medical: Not on file  Tobacco Use  . Smoking status: Never Smoker  . Smokeless tobacco: Never Used  Substance and  Sexual Activity  . Alcohol use: Yes    Alcohol/week: 0.0 standard drinks    Comment: wine  . Drug use: No  . Sexual activity: Not on file  Lifestyle  . Physical activity    Days per week: Not on file    Minutes per session: Not on file  . Stress: Not on file  Relationships  . Social Herbalist on phone: Not on file    Gets together: Not on file    Attends religious service: Not on file    Active member of club or organization: Not on file    Attends meetings of clubs or organizations: Not on file    Relationship status: Not on file  . Intimate partner violence    Fear of current or ex partner: Not on file    Emotionally abused: Not on file    Physically abused: Not on file    Forced sexual activity: Not on file  Other Topics Concern  . Not on file   Social History Narrative   Has living will   1 living daughter is  health care POA.     Has DNR already   Request no feeding tube.  Does not want extended ventilator support.   Review of Systems  Appetite is okay Some trouble with sleeping due to the shoulder pain     Objective:   Physical Exam  Constitutional: She appears well-developed. No distress.  Musculoskeletal:     Comments: Bruising along extensor right arm  Neurological:  Able to lift right arm up over her head  Psychiatric: She has a normal mood and affect. Her behavior is normal.           Assessment & Plan:

## 2018-09-25 ENCOUNTER — Ambulatory Visit (INDEPENDENT_AMBULATORY_CARE_PROVIDER_SITE_OTHER): Payer: Medicare HMO | Admitting: Obstetrics & Gynecology

## 2018-09-25 ENCOUNTER — Other Ambulatory Visit: Payer: Self-pay

## 2018-09-25 ENCOUNTER — Encounter: Payer: Self-pay | Admitting: Obstetrics & Gynecology

## 2018-09-25 VITALS — BP 150/80 | Wt 157.0 lb

## 2018-09-25 DIAGNOSIS — N814 Uterovaginal prolapse, unspecified: Secondary | ICD-10-CM

## 2018-09-25 DIAGNOSIS — R339 Retention of urine, unspecified: Secondary | ICD-10-CM | POA: Diagnosis not present

## 2018-09-25 NOTE — Progress Notes (Signed)
  HPI:      [name] is a 85 y.o. [id number] who presents today for her pessary follow up and examination related to her pelvic floor weakening.  Pt reports tolerating the pessary well with  no vaginal bleeding and  no vaginal discharge.  Symptoms of pelvic floor weakening have greatly improved. She is voiding and defecating without difficulty. She currently has a RING #3 pessary.  PMHx: She  has a past medical history of Diverticulosis, GERD (gastroesophageal reflux disease), Hyperlipidemia, Hypertension, Osteoporosis, and Polymyalgia rheumatica ([place]). Also,  has a past surgical history that includes Tonsillectomy; Ankle fracture surgery (07/12/2005); and Cataract extraction (2012)., family history includes Arthritis in her mother; Cancer in her sister; Diabetes in her brother; Heart disease in her daughter; Heart disease (age of onset: 21) in her father; Rheum arthritis in her daughter; Stroke in her sister.,  reports that she has never smoked. She has never used smokeless tobacco. She reports current alcohol use. She reports that she does not use drugs.  She has a current medication list which includes the following prescription(s): acetaminophen, diphenhydramine-acetaminophen, losartan-hydrochlorothiazide, multivitamin, naproxen sodium, omeprazole, OVER THE COUNTER MEDICATION, and tramadol. Also, is allergic to alendronate sodium.  Review of Systems  All other systems reviewed and are negative.   Objective: BP (!) 150/80   Wt 157 lb (71.2 kg)   BMI 30.66 kg/m  Physical Exam Constitutional:      General: She is not in acute distress.    Appearance: She is well-developed.  Genitourinary:     Pelvic exam was performed with patient supine.     Vagina, cervix and uterus normal.     No vaginal erythema or bleeding.     No cervical polyp or nabothian cyst.     Uterus is mobile.     Uterus is not enlarged.     No uterine mass detected.    Uterus is midaxial and regular.     No right  or left adnexal mass present.     Right adnexa not tender.     Left adnexa not tender.     Genitourinary Comments: Gr 4 uterine prolapse  Gr 2 cystocele  HENT:     Head: Normocephalic and atraumatic.     Nose: Nose normal.  Abdominal:     General: There is no distension.     Palpations: Abdomen is soft.     Tenderness: There is no abdominal tenderness.  Musculoskeletal: Normal range of motion.  Neurological:     Mental Status: She is alert and oriented to person, place, and time.     Cranial Nerves: No cranial nerve deficit.  Skin:    General: Skin is warm and dry.     Pessary Care Pessary removed and cleaned.  Vagina checked - without erosions - pessary replaced.  A/P:1. Uterine prolapse 2. Urinary retention with incomplete bladder emptying  Pessary was cleaned and replaced today. Instructions given for care. Concerning symptoms to observe for are counseled to patient. Follow up scheduled for 3 months.  A total of 15 minutes were spent face-to-face with the patient during this encounter and over half of that time dealt with counseling and coordination of care.  Barnett Applebaum, MD, [facility], [facility] 09/25/2018  11:24 AM

## 2018-10-25 ENCOUNTER — Other Ambulatory Visit: Payer: Self-pay | Admitting: Internal Medicine

## 2019-01-03 ENCOUNTER — Encounter: Payer: Self-pay | Admitting: Obstetrics & Gynecology

## 2019-01-03 ENCOUNTER — Ambulatory Visit (INDEPENDENT_AMBULATORY_CARE_PROVIDER_SITE_OTHER): Payer: Medicare HMO | Admitting: Obstetrics & Gynecology

## 2019-01-03 ENCOUNTER — Other Ambulatory Visit: Payer: Self-pay

## 2019-01-03 VITALS — BP 150/80 | Ht 60.0 in | Wt 159.0 lb

## 2019-01-03 DIAGNOSIS — R339 Retention of urine, unspecified: Secondary | ICD-10-CM | POA: Diagnosis not present

## 2019-01-03 DIAGNOSIS — N814 Uterovaginal prolapse, unspecified: Secondary | ICD-10-CM

## 2019-01-03 NOTE — Progress Notes (Signed)
HPI:      [name] is a 85 y.o. [id number] who presents today for her pessary follow up and examination related to her pelvic floor weakening.  Pt reports tolerating the pessary well with  no vaginal bleeding and  no vaginal discharge.  Symptoms of pelvic floor weakening have greatly improved. She is voiding and defecating without difficulty. She currently has a #3 Ring pessary.  No urinary rentention recently.  PMHx: She  has a past medical history of Diverticulosis, GERD (gastroesophageal reflux disease), Hyperlipidemia, Hypertension, Osteoporosis, and Polymyalgia rheumatica ([place]). Also,  has a past surgical history that includes Tonsillectomy; Ankle fracture surgery (07/12/2005); and Cataract extraction (2012)., family history includes Arthritis in her mother; Cancer in her sister; Diabetes in her brother; Heart disease in her daughter; Heart disease (age of onset: 68) in her father; Rheum arthritis in her daughter; Stroke in her sister.,  reports that she has never smoked. She has never used smokeless tobacco. She reports current alcohol use. She reports that she does not use drugs.  She has a current medication list which includes the following prescription(s): acetaminophen, diphenhydramine-acetaminophen, losartan-hydrochlorothiazide, multivitamin, naproxen sodium, omeprazole, OVER THE COUNTER MEDICATION, and tramadol. Also, is allergic to alendronate sodium.  Review of Systems  All other systems reviewed and are negative.   Objective: BP (!) 150/80   Ht 5' (1.524 m)   Wt 159 lb (72.1 kg)   BMI 31.05 kg/m  Physical Exam Constitutional:      General: She is not in acute distress.    Appearance: She is well-developed.  Genitourinary:     Pelvic exam was performed with patient supine.     Vagina and uterus normal.     No vaginal erythema or bleeding.     No cervical motion tenderness, discharge, polyp or nabothian cyst.     Uterus is mobile.     Uterus is not enlarged.     No  uterine mass detected.    Uterus is midaxial.     No right or left adnexal mass present.     Right adnexa not tender.     Left adnexa not tender.     Genitourinary Comments: Gr 4 uterine prolapse  Gr 2 cystocele   HENT:     Head: Normocephalic and atraumatic.     Nose: Nose normal.  Abdominal:     General: There is no distension.     Palpations: Abdomen is soft.     Tenderness: There is no abdominal tenderness.  Musculoskeletal: Normal range of motion.  Neurological:     Mental Status: She is alert and oriented to person, place, and time.     Cranial Nerves: No cranial nerve deficit.  Skin:    General: Skin is warm and dry.  Psychiatric:        Attention and Perception: Attention normal.        Mood and Affect: Mood and affect normal.        Speech: Speech normal.        Behavior: Behavior normal.        Thought Content: Thought content normal.        Judgment: Judgment normal.     Pessary Care Pessary removed and cleaned.  Vagina checked - without erosions - pessary replaced.  A/P:   ICD-10-CM   1. Urinary retention with incomplete bladder emptying  R33.9   2. Uterine prolapse  N81.4    Pessary was cleaned and replaced today. Instructions given for  care. Concerning symptoms to observe for are counseled to patient. Follow up scheduled for 3 months.  A total of 15 minutes were spent face-to-face with the patient during this encounter and over half of that time dealt with counseling and coordination of care.  Barnett Applebaum, MD, [facility], [facility] 01/03/2019  8:39 AM

## 2019-04-02 DIAGNOSIS — H16141 Punctate keratitis, right eye: Secondary | ICD-10-CM | POA: Diagnosis not present

## 2019-04-08 ENCOUNTER — Other Ambulatory Visit: Payer: Self-pay

## 2019-04-08 ENCOUNTER — Encounter: Payer: Self-pay | Admitting: Obstetrics & Gynecology

## 2019-04-08 ENCOUNTER — Ambulatory Visit (INDEPENDENT_AMBULATORY_CARE_PROVIDER_SITE_OTHER): Payer: Medicare HMO | Admitting: Obstetrics & Gynecology

## 2019-04-08 VITALS — BP 140/80 | Ht 60.0 in | Wt 156.0 lb

## 2019-04-08 DIAGNOSIS — R339 Retention of urine, unspecified: Secondary | ICD-10-CM

## 2019-04-08 DIAGNOSIS — N814 Uterovaginal prolapse, unspecified: Secondary | ICD-10-CM

## 2019-04-08 NOTE — Progress Notes (Signed)
HPI:      [name] is a 86 y.o. [id number] who presents today for her pessary follow up and examination related to her pelvic floor weakening.  Pt reports tolerating the pessary well with  no vaginal bleeding and  no vaginal discharge.  Symptoms of pelvic floor weakening have greatly improved. She is voiding and defecating without difficulty. She currently has a Ring 3 pessary.  PMHx: She  has a past medical history of Diverticulosis, GERD (gastroesophageal reflux disease), Hyperlipidemia, Hypertension, Osteoporosis, and Polymyalgia rheumatica ([place]). Also,  has a past surgical history that includes Tonsillectomy; Ankle fracture surgery (07/12/2005); and Cataract extraction (2012)., family history includes Arthritis in her mother; Cancer in her sister; Diabetes in her brother; Heart disease in her daughter; Heart disease (age of onset: 49) in her father; Rheum arthritis in her daughter; Stroke in her sister.,  reports that she has never smoked. She has never used smokeless tobacco. She reports current alcohol use. She reports that she does not use drugs.  She has a current medication list which includes the following prescription(s): acetaminophen, diphenhydramine-acetaminophen, losartan-hydrochlorothiazide, multivitamin, naproxen sodium, omeprazole, OVER THE COUNTER MEDICATION, and tramadol. Also, is allergic to alendronate sodium.  Review of Systems  All other systems reviewed and are negative.   Objective: BP 140/80   Ht 5' (1.524 m)   Wt 156 lb (70.8 kg)   BMI 30.47 kg/m  Physical Exam Constitutional:      General: She is not in acute distress.    Appearance: She is well-developed.  Genitourinary:     Pelvic exam was performed with patient supine.     Vagina and uterus normal.     No vaginal erythema or bleeding.     No cervical motion tenderness, discharge, polyp or nabothian cyst.     Uterus is mobile.     Uterus is not enlarged.     No uterine mass detected.    Uterus is  midaxial.     No right or left adnexal mass present.     Right adnexa not tender.     Left adnexa not tender.     Genitourinary Comments: Gr 4 uterine prolapse  Gr 2 cystocele    HENT:     Head: Normocephalic and atraumatic.     Nose: Nose normal.  Abdominal:     General: There is no distension.     Palpations: Abdomen is soft.     Tenderness: There is no abdominal tenderness.  Musculoskeletal:        General: Normal range of motion.  Neurological:     Mental Status: She is alert and oriented to person, place, and time.     Cranial Nerves: No cranial nerve deficit.  Skin:    General: Skin is warm and dry.  Psychiatric:        Attention and Perception: Attention normal.        Mood and Affect: Mood and affect normal.        Speech: Speech normal.        Behavior: Behavior normal.        Thought Content: Thought content normal.        Judgment: Judgment normal.     Pessary Care Pessary removed and cleaned.  Vagina checked - without erosions - pessary replaced.  A/P:1. Uterine prolapse 2. Urinary retention with incomplete bladder emptying Pessary was cleaned and replaced today. Instructions given for care. Concerning symptoms to observe for are counseled to patient. Follow up  scheduled for 3 months.  A total of 20 minutes were spent face-to-face with the patient as well as preparation, review, communication, and documentation during this encounter.   Barnett Applebaum, MD, [facility], [facility] 04/08/2019  11:37 AM

## 2019-04-16 DIAGNOSIS — H16141 Punctate keratitis, right eye: Secondary | ICD-10-CM | POA: Diagnosis not present

## 2019-06-28 DIAGNOSIS — B399 Histoplasmosis, unspecified: Secondary | ICD-10-CM | POA: Diagnosis not present

## 2019-06-28 DIAGNOSIS — H2512 Age-related nuclear cataract, left eye: Secondary | ICD-10-CM | POA: Diagnosis not present

## 2019-07-05 DIAGNOSIS — H6123 Impacted cerumen, bilateral: Secondary | ICD-10-CM | POA: Diagnosis not present

## 2019-07-05 DIAGNOSIS — H903 Sensorineural hearing loss, bilateral: Secondary | ICD-10-CM | POA: Diagnosis not present

## 2019-07-09 ENCOUNTER — Encounter: Payer: Self-pay | Admitting: Obstetrics & Gynecology

## 2019-07-09 ENCOUNTER — Ambulatory Visit (INDEPENDENT_AMBULATORY_CARE_PROVIDER_SITE_OTHER): Payer: Medicare HMO | Admitting: Obstetrics & Gynecology

## 2019-07-09 ENCOUNTER — Other Ambulatory Visit: Payer: Self-pay

## 2019-07-09 ENCOUNTER — Ambulatory Visit: Payer: Medicare HMO | Admitting: Obstetrics & Gynecology

## 2019-07-09 VITALS — BP 130/80 | Ht 60.0 in | Wt 156.0 lb

## 2019-07-09 DIAGNOSIS — N814 Uterovaginal prolapse, unspecified: Secondary | ICD-10-CM | POA: Diagnosis not present

## 2019-07-09 DIAGNOSIS — R339 Retention of urine, unspecified: Secondary | ICD-10-CM | POA: Diagnosis not present

## 2019-07-09 NOTE — Progress Notes (Signed)
HPI:      [name] is a 86 y.o. [id number] who presents today for her pessary follow up and examination related to her pelvic floor weakening.  Pt reports tolerating the pessary well with no vaginal bleeding and no vaginal discharge.  Symptoms of pelvic floor weakening have greatly improved. She is voiding and defecating without difficulty. She currently has a RING #3 pessary.  She reports some urinary frequency, no urge or nocturia (1-2 times nightly).  PMHx: She  has a past medical history of Diverticulosis, GERD (gastroesophageal reflux disease), Hyperlipidemia, Hypertension, Osteoporosis, and Polymyalgia rheumatica ([place]). Also,  has a past surgical history that includes Tonsillectomy; Ankle fracture surgery (07/12/2005); and Cataract extraction (2012)., family history includes Arthritis in her mother; Cancer in her sister; Diabetes in her brother; Heart disease in her daughter; Heart disease (age of onset: 53) in her father; Rheum arthritis in her daughter; Stroke in her sister.,  reports that she has never smoked. She has never used smokeless tobacco. She reports current alcohol use. She reports that she does not use drugs.  She has a current medication list which includes the following prescription(s): acetaminophen, diphenhydramine-acetaminophen, losartan-hydrochlorothiazide, multivitamin, naproxen sodium, omeprazole, OVER THE COUNTER MEDICATION, and tramadol. Also, is allergic to alendronate sodium.  Review of Systems  All other systems reviewed and are negative.   Objective: BP 130/80   Ht 5' (1.524 m)   Wt 156 lb (70.8 kg)   BMI 30.47 kg/m  Physical Exam Constitutional:      General: She is not in acute distress.    Appearance: She is well-developed.  Genitourinary:     Pelvic exam was performed with patient supine.     Urethra, bladder, vagina and uterus normal.     No vaginal erythema or bleeding.     No cervical motion tenderness, discharge, polyp or nabothian cyst.     Uterus is mobile.     Uterus is not enlarged, tender or irregular.     No uterine mass detected.    Uterus is midaxial and regular.     No right or left adnexal mass present.     Right adnexa not tender.     Left adnexa not tender.     Genitourinary Comments: Gr 4 uterine prolapse, no excoriations  Gr 2 proximal cystocele     HENT:     Head: Normocephalic and atraumatic.     Nose: Nose normal.  Abdominal:     General: There is no distension.     Palpations: Abdomen is soft.     Tenderness: There is no abdominal tenderness.  Musculoskeletal:        General: Normal range of motion.  Neurological:     Mental Status: She is alert and oriented to person, place, and time.     Cranial Nerves: No cranial nerve deficit.  Skin:    General: Skin is warm and dry.  Psychiatric:        Attention and Perception: Attention normal.        Mood and Affect: Mood and affect normal.        Speech: Speech normal.        Behavior: Behavior normal.        Thought Content: Thought content normal.        Judgment: Judgment normal.     Pessary Care Pessary removed and cleaned.  Vagina checked - without erosions - pessary replaced.   SIze small (could adjust upwards) but as long as  no expulsion or problematic sx's then continue w RIng #3.  A/P:1. Uterine prolapse 2. Urinary retention with incomplete bladder emptying Pessary was cleaned and replaced today. Instructions given for care. Concerning symptoms to observe for are counseled to patient. Follow up scheduled for 3 months.  A total of 20 minutes were spent face-to-face with the patient as well as preparation, review, communication, and documentation during this encounter.   Barnett Applebaum, MD, [facility], [facility] 07/09/2019  8:01 AM

## 2019-08-21 ENCOUNTER — Ambulatory Visit (INDEPENDENT_AMBULATORY_CARE_PROVIDER_SITE_OTHER): Payer: Medicare HMO | Admitting: Internal Medicine

## 2019-08-21 ENCOUNTER — Other Ambulatory Visit: Payer: Self-pay

## 2019-08-21 ENCOUNTER — Encounter: Payer: Self-pay | Admitting: Internal Medicine

## 2019-08-21 VITALS — BP 122/80 | HR 68 | Temp 97.2°F | Ht 58.5 in | Wt 153.0 lb

## 2019-08-21 DIAGNOSIS — I10 Essential (primary) hypertension: Secondary | ICD-10-CM

## 2019-08-21 DIAGNOSIS — N814 Uterovaginal prolapse, unspecified: Secondary | ICD-10-CM

## 2019-08-21 DIAGNOSIS — K219 Gastro-esophageal reflux disease without esophagitis: Secondary | ICD-10-CM

## 2019-08-21 DIAGNOSIS — Z0000 Encounter for general adult medical examination without abnormal findings: Secondary | ICD-10-CM | POA: Diagnosis not present

## 2019-08-21 DIAGNOSIS — M159 Polyosteoarthritis, unspecified: Secondary | ICD-10-CM | POA: Diagnosis not present

## 2019-08-21 DIAGNOSIS — M353 Polymyalgia rheumatica: Secondary | ICD-10-CM

## 2019-08-21 DIAGNOSIS — Z7189 Other specified counseling: Secondary | ICD-10-CM | POA: Diagnosis not present

## 2019-08-21 LAB — CBC
HCT: 38.4 % (ref 36.0–46.0)
Hemoglobin: 12.9 g/dL (ref 12.0–15.0)
MCHC: 33.6 g/dL (ref 30.0–36.0)
MCV: 93 fl (ref 78.0–100.0)
Platelets: 187 10*3/uL (ref 150.0–400.0)
RBC: 4.13 Mil/uL (ref 3.87–5.11)
RDW: 12.9 % (ref 11.5–15.5)
WBC: 3.9 10*3/uL — ABNORMAL LOW (ref 4.0–10.5)

## 2019-08-21 LAB — HEPATIC FUNCTION PANEL
ALT: 11 U/L (ref 0–35)
AST: 19 U/L (ref 0–37)
Albumin: 4.2 g/dL (ref 3.5–5.2)
Alkaline Phosphatase: 60 U/L (ref 39–117)
Bilirubin, Direct: 0.1 mg/dL (ref 0.0–0.3)
Total Bilirubin: 0.5 mg/dL (ref 0.2–1.2)
Total Protein: 6.3 g/dL (ref 6.0–8.3)

## 2019-08-21 LAB — RENAL FUNCTION PANEL
Albumin: 4.2 g/dL (ref 3.5–5.2)
BUN: 18 mg/dL (ref 6–23)
CO2: 30 mEq/L (ref 19–32)
Calcium: 9.8 mg/dL (ref 8.4–10.5)
Chloride: 101 mEq/L (ref 96–112)
Creatinine, Ser: 0.87 mg/dL (ref 0.40–1.20)
GFR: 60.69 mL/min (ref >60.00)
Glucose, Bld: 101 mg/dL — ABNORMAL HIGH (ref 70–99)
Phosphorus: 2.8 mg/dL (ref 2.3–4.6)
Potassium: 5.1 mEq/L (ref 3.5–5.1)
Sodium: 135 mEq/L (ref 135–145)

## 2019-08-21 LAB — SEDIMENTATION RATE: Sed Rate: 11 mm/hr (ref 0–30)

## 2019-08-21 LAB — T4, FREE: Free T4: 1.26 ng/dL (ref 0.60–1.60)

## 2019-08-21 NOTE — Progress Notes (Signed)
Subjective:    [name], female    DOB: 12/03/1923, 86 y.o.   MRN: [id number]  HPI Here for Medicare wellness visit and follow up of chronic health conditions This visit occurred during the SARS-CoV-2 public health emergency.  Safety protocols were in place, including screening questions prior to the visit, additional usage of staff PPE, and extensive cleaning of exam room while observing appropriate contact time as indicated for disinfecting solutions.   Reviewed advanced directives Reviewed other doctors----Dr Harris--gyn, Dr Sharee Pimple, Dr Quintella Reichert, Dr Kellie Moor --derm No tobacco Enjoys occasional glass of wine Not much exercise---discussed resistance work Not depressed---enjoys reading so didn't mind staying home Vision okay--does have cataract (but not ready for surgery) Hearing remains poor--despite the hearing aides No falls Independent with instrumental ADLs Mild memory issues---mostly names  Walks short distances--usually doesn't use cane (unless on uneven ground) Wonders about getting walker Still gets out--drives, grocery store, etc (had to get new car)  Chronic low back pain Heating pad helps Takes tylenol (not very effective)---occasional aleve No clear myalgias recently  Continues on the omeprazole Controls heartburn No dysphagia  Occasional dry cough---throat irritation (cough drop helps) No sig allergy problems No particular time of day No SOB  No chest pain No headaches No palpitations No sig edema Rare dizziness---brief. No syncope  Pessary helps Keeps up with Dr Kenton Kingfisher  Current Outpatient Medications on File Prior to Visit  Medication Sig Dispense Refill  . acetaminophen (TYLENOL) 325 MG tablet Take 650 mg by mouth every 6 (six) hours as needed.    . diphenhydramine-acetaminophen (TYLENOL PM EXTRA STRENGTH) 25-500 MG TABS tablet Take 1 tablet by mouth at bedtime as needed.    Marland Kitchen losartan-hydrochlorothiazide  (HYZAAR) 50-12.5 MG tablet TAKE 1 TABLET EVERY DAY 90 tablet 3  . Multiple Vitamin (MULTIVITAMIN) capsule Take 1 capsule by mouth daily.      . Naproxen Sodium 220 MG CAPS Take 1 capsule by mouth 2 (two) times daily as needed.    Marland Kitchen omeprazole (PRILOSEC) 20 MG capsule Take 20 mg by mouth daily.      Marland Kitchen OVER THE COUNTER MEDICATION Curcumin 2K Take 1 Daily     No current facility-administered medications on file prior to visit.    Allergies  Allergen Reactions  . Alendronate Sodium     REACTION: GI    Past Medical History:  Diagnosis Date  . Diverticulosis   . GERD (gastroesophageal reflux disease)   . Hyperlipidemia   . Hypertension   . Osteoporosis   . Polymyalgia rheumatica ([facility])     Past Surgical History:  Procedure Laterality Date  . ANKLE FRACTURE SURGERY  07/12/2005  . CATARACT EXTRACTION  2012   right  . TONSILLECTOMY      Family History  Problem Relation Age of Onset  . Arthritis Mother   . Heart disease Father 27  . Diabetes Brother   . Cancer Sister        ovarian  . Stroke Sister   . Heart disease Daughter   . Rheum arthritis Daughter     Social History   Socioeconomic History  . Marital status: Widowed    Spouse name: Not on file  . Number of children: 2  . Years of education: Not on file  . Highest education level: Not on file  Occupational History  . Occupation: House wife, farming  Tobacco Use  . Smoking status: Never Smoker  . Smokeless tobacco: Never Used  Substance and Sexual Activity  .  Alcohol use: Yes    Alcohol/week: 0.0 standard drinks    Comment: wine  . Drug use: No  . Sexual activity: Not on file  Other Topics Concern  . Not on file  Social History Narrative   Has living will   1 living daughter is  health care POA.     Has DNR already   Request no feeding tube.  Does not want extended ventilator support.   Social Determinants of Health   Financial Resource Strain:   . Difficulty of Paying Living Expenses:   Food  Insecurity:   . Worried About Charity fundraiser in the Last Year:   . Arboriculturist in the Last Year:   Transportation Needs:   . Film/video editor (Medical):   Marland Kitchen Lack of Transportation (Non-Medical):   Physical Activity:   . Days of Exercise per Week:   . Minutes of Exercise per Session:   Stress:   . Feeling of Stress :   Social Connections:   . Frequency of Communication with Friends and Family:   . Frequency of Social Gatherings with Friends and Family:   . Attends Religious Services:   . Active Member of Clubs or Organizations:   . Attends Archivist Meetings:   Marland Kitchen Marital Status:   Intimate Partner Violence:   . Fear of Current or Ex-Partner:   . Emotionally Abused:   Marland Kitchen Physically Abused:   . Sexually Abused:    Review of Systems Energy levels are "not what they used to be" Appetite is okay---less at eat meal but keeps up Weight stable Sleeps okay with tylenol PM nightly---discussed limiting Wears seat belt Teeth okay---keeps up No recent derm visit--nothing worrisome now Bowels move fine--no blood    Objective:   Physical Exam  Constitutional: She is oriented to person, place, and time. She appears well-developed. No distress.  HENT:  Mouth/Throat: Oropharynx is clear and moist.  No oral lesions  Neck: No thyromegaly present.  Cardiovascular: Normal rate, regular rhythm, normal heart sounds and intact distal pulses. Exam reveals no gallop.  No murmur heard. Respiratory: Effort normal and breath sounds normal. No respiratory distress. She has no wheezes. She has no rales.  GI: Soft. There is no abdominal tenderness.  Musculoskeletal:        General: No tenderness or edema.  Lymphadenopathy:    She has no cervical adenopathy.  Neurological: She is alert and oriented to person, place, and time.  Slightly mixed up about year President--- "Zoila Shutter, [place] or Bush----Obama" [phone] [id number] Recall 2/3--then got the other  one  Mildly unstable getting on and off table  Skin: No rash noted. No erythema.  Psychiatric: She has a normal mood and affect. Her behavior is normal.           Assessment & Plan:

## 2019-08-21 NOTE — Assessment & Plan Note (Signed)
Discussed limiting the aleve Will check renal function

## 2019-08-21 NOTE — Assessment & Plan Note (Signed)
BP Readings from Last 3 Encounters:  08/21/19 122/80  07/09/19 130/80  04/08/19 140/80   Good control on the losartan/HCTZ Will check labs

## 2019-08-21 NOTE — Assessment & Plan Note (Signed)
Still seems to be in remission Will check sed rate

## 2019-08-21 NOTE — Assessment & Plan Note (Signed)
Has DNR 

## 2019-08-21 NOTE — Assessment & Plan Note (Signed)
Does well with the pessary

## 2019-08-21 NOTE — Progress Notes (Signed)
Hearing Screening   125Hz  250Hz  500Hz  1000Hz  2000Hz  3000Hz  4000Hz  6000Hz  8000Hz   Right ear:           Left ear:           Comments: Has hearing aids. Wearing them today.  Vision Screening Comments: May 2021

## 2019-08-21 NOTE — Assessment & Plan Note (Signed)
I have personally reviewed the Medicare Annual Wellness questionnaire and have noted 1. The patient's medical and social history 2. Their use of alcohol, tobacco or illicit drugs 3. Their current medications and supplements 4. The patient's functional ability including ADL's, fall risks, home safety risks and hearing or visual             impairment. 5. Diet and physical activities 6. Evidence for depression or mood disorders  The patients weight, height, BMI and visual acuity have been recorded in the chart I have made referrals, counseling and provided education to the patient based review of the above and I have provided the pt with a written personalized care plan for preventive services.  I have provided you with a copy of your personalized plan for preventive services. Please take the time to review along with your updated medication list.  No cancer screening due to age Flu vaccine in the fall Td if any injury Likely will need COVID booster Discussed exercise

## 2019-08-21 NOTE — Assessment & Plan Note (Signed)
Does okay with omeprazole

## 2019-09-18 ENCOUNTER — Other Ambulatory Visit: Payer: Self-pay | Admitting: Internal Medicine

## 2019-09-26 DIAGNOSIS — H903 Sensorineural hearing loss, bilateral: Secondary | ICD-10-CM | POA: Diagnosis not present

## 2019-10-08 ENCOUNTER — Encounter: Payer: Self-pay | Admitting: Obstetrics & Gynecology

## 2019-10-08 ENCOUNTER — Other Ambulatory Visit: Payer: Self-pay

## 2019-10-08 ENCOUNTER — Ambulatory Visit (INDEPENDENT_AMBULATORY_CARE_PROVIDER_SITE_OTHER): Payer: Medicare HMO | Admitting: Obstetrics & Gynecology

## 2019-10-08 VITALS — BP 122/80 | Ht 60.0 in | Wt 151.0 lb

## 2019-10-08 DIAGNOSIS — N814 Uterovaginal prolapse, unspecified: Secondary | ICD-10-CM

## 2019-10-08 NOTE — Patient Instructions (Signed)
Thank you for choosing [facility]. As part of our ongoing efforts to improve patient experience, we would appreciate your feedback. Please fill out the short survey that you will receive by mail or MyChart. Your opinion is important to us! -Dr Mattalyn Anderegg  

## 2019-10-08 NOTE — Progress Notes (Signed)
HPI:      [name] is a 86 y.o. [id number] who presents today for her pessary follow up and examination related to her pelvic floor weakening.  Pt reports tolerating the pessary well with no vaginal bleeding and no vaginal discharge.  Symptoms of pelvic floor weakening have greatly improved. She is voiding and defecating without difficulty. She currently has a Ring #3 pessary.  PMHx: She  has a past medical history of Diverticulosis, GERD (gastroesophageal reflux disease), Hyperlipidemia, Hypertension, Osteoporosis, and Polymyalgia rheumatica ([place]). Also,  has a past surgical history that includes Tonsillectomy; Ankle fracture surgery (07/12/2005); and Cataract extraction (2012)., family history includes Arthritis in her mother; Cancer in her sister; Diabetes in her brother; Heart disease in her daughter; Heart disease (age of onset: 48) in her father; Rheum arthritis in her daughter; Stroke in her sister.,  reports that she has never smoked. She has never used smokeless tobacco. She reports current alcohol use. She reports that she does not use drugs.  She has a current medication list which includes the following prescription(s): acetaminophen, diphenhydramine-acetaminophen, losartan-hydrochlorothiazide, multivitamin, naproxen sodium, omeprazole, and OVER THE COUNTER MEDICATION. Also, is allergic to alendronate sodium.  Review of Systems  All other systems reviewed and are negative.   Objective: There were no vitals taken for this visit. Physical Exam Constitutional:      General: She is not in acute distress.    Appearance: She is well-developed.  Genitourinary:     Pelvic exam was performed with patient supine.     Urethra, bladder, vagina and uterus normal.     Bladder neck is mobile.     No vaginal erythema or bleeding.     No cervical motion tenderness, discharge, lesion, bleeding, polyp or nabothian cyst.     Uterus is mobile.     Uterus is not enlarged.     No uterine mass  detected.    Uterus is midaxial.     No right or left adnexal mass present.     Right adnexa not tender.     Left adnexa not tender.     Genitourinary Comments: Gr 4 uterine prolapse, no excoriations  Gr 2 proximal cystocele      HENT:     Head: Normocephalic and atraumatic.     Nose: Nose normal.  Abdominal:     General: There is no distension.     Palpations: Abdomen is soft.     Tenderness: There is no abdominal tenderness.  Musculoskeletal:        General: Normal range of motion.  Neurological:     Mental Status: She is alert and oriented to person, place, and time.     Cranial Nerves: No cranial nerve deficit.  Skin:    General: Skin is warm and dry.  Psychiatric:        Attention and Perception: Attention normal.        Mood and Affect: Mood and affect normal.        Speech: Speech normal.        Behavior: Behavior normal.        Thought Content: Thought content normal.        Judgment: Judgment normal.    Pessary Care Pessary removed and cleaned.  Vagina checked - without erosions - pessary replaced.  A/P:   ICD-10-CM   1. Uterine prolapse  N81.4    Pessary was cleaned and replaced today. Instructions given for care. Concerning symptoms to observe for are counseled  to patient. Follow up scheduled for 3 months.  A total of 20 minutes were spent face-to-face with the patient as well as preparation, review, communication, and documentation during this encounter.   Barnett Applebaum, MD, [facility], [facility] 10/08/2019  8:00 AM

## 2020-01-09 ENCOUNTER — Encounter: Payer: Self-pay | Admitting: Obstetrics & Gynecology

## 2020-01-09 ENCOUNTER — Ambulatory Visit (INDEPENDENT_AMBULATORY_CARE_PROVIDER_SITE_OTHER): Payer: Medicare HMO | Admitting: Obstetrics & Gynecology

## 2020-01-09 ENCOUNTER — Other Ambulatory Visit: Payer: Self-pay

## 2020-01-09 VITALS — BP 150/80 | Ht 60.0 in | Wt 153.0 lb

## 2020-01-09 DIAGNOSIS — N814 Uterovaginal prolapse, unspecified: Secondary | ICD-10-CM | POA: Diagnosis not present

## 2020-01-09 DIAGNOSIS — R339 Retention of urine, unspecified: Secondary | ICD-10-CM

## 2020-01-09 NOTE — Progress Notes (Signed)
HPI:      [name] is a 86 y.o. [id number] who presents today for her pessary follow up and examination related to her pelvic floor weakening.  Pt reports tolerating the pessary well with no vaginal bleeding and no vaginal discharge.  Symptoms of pelvic floor weakening have greatly improved. She is voiding and defecating without difficulty. She currently has a #3ring type pessary.  PMHx: She  has a past medical history of Diverticulosis, GERD (gastroesophageal reflux disease), Hyperlipidemia, Hypertension, Osteoporosis, and Polymyalgia rheumatica ([place]). Also,  has a past surgical history that includes Tonsillectomy; Ankle fracture surgery (07/12/2005); and Cataract extraction (2012)., family history includes Arthritis in her mother; Cancer in her sister; Diabetes in her brother; Heart disease in her daughter; Heart disease (age of onset: 97) in her father; Rheum arthritis in her daughter; Stroke in her sister.,  reports that she has never smoked. She has never used smokeless tobacco. She reports current alcohol use. She reports that she does not use drugs.  She has a current medication list which includes the following prescription(s): acetaminophen, diphenhydramine-acetaminophen, losartan-hydrochlorothiazide, multivitamin, naproxen sodium, omeprazole, and OVER THE COUNTER MEDICATION. Also, is allergic to alendronate sodium.  Review of Systems  All other systems reviewed and are negative.   Objective: BP (!) 150/80   Ht 5' (1.524 m)   Wt 153 lb (69.4 kg)   BMI 29.88 kg/m  Physical Exam Constitutional:      General: She is not in acute distress.    Appearance: She is well-developed.  Genitourinary:     Pelvic exam was performed with patient supine.     Urethra, bladder, vagina and uterus normal.     No vaginal erythema or bleeding.     No cervical motion tenderness, discharge, polyp or nabothian cyst.     Uterus is mobile.     Uterus is not enlarged.     No uterine mass  detected.    Uterus is midaxial.     No right or left adnexal mass present.     Right adnexa not tender.     Left adnexa not tender.     Genitourinary Comments: Gr 4 uterine prolapse, no excoriations  Gr 2 proximal cystocele       HENT:     Head: Normocephalic and atraumatic.     Nose: Nose normal.  Abdominal:     General: There is no distension.     Palpations: Abdomen is soft.     Tenderness: There is no abdominal tenderness.  Musculoskeletal:        General: Normal range of motion.  Neurological:     Mental Status: She is alert and oriented to person, place, and time.     Cranial Nerves: No cranial nerve deficit.  Skin:    General: Skin is warm and dry.  Psychiatric:        Attention and Perception: Attention normal.        Mood and Affect: Mood and affect normal.        Speech: Speech normal.        Behavior: Behavior normal.        Thought Content: Thought content normal.        Judgment: Judgment normal.     Pessary Care Pessary removed and cleaned.  Vagina checked - without erosions - pessary replaced.  A/P:   ICD-10-CM   1. Uterine prolapse  N81.4   2. Urinary retention with incomplete bladder emptying  R33.9  Pessary was cleaned and replaced today. Instructions given for care. Concerning symptoms to observe for are counseled to patient. Follow up scheduled for 3 months.  A total of 20 minutes were spent face-to-face with the patient as well as preparation, review, communication, and documentation during this encounter.   Ann Applebaum, MD, [facility], [facility] 01/09/2020  8:06 AM

## 2020-04-06 ENCOUNTER — Telehealth: Payer: Self-pay

## 2020-04-06 NOTE — Telephone Encounter (Signed)
Error

## 2020-04-07 ENCOUNTER — Other Ambulatory Visit: Payer: Self-pay

## 2020-04-07 ENCOUNTER — Ambulatory Visit (INDEPENDENT_AMBULATORY_CARE_PROVIDER_SITE_OTHER): Payer: Medicare HMO | Admitting: Internal Medicine

## 2020-04-07 ENCOUNTER — Encounter: Payer: Self-pay | Admitting: Internal Medicine

## 2020-04-07 VITALS — BP 138/86 | HR 70 | Temp 98.0°F | Ht 60.0 in | Wt 155.0 lb

## 2020-04-07 DIAGNOSIS — I10 Essential (primary) hypertension: Secondary | ICD-10-CM | POA: Diagnosis not present

## 2020-04-07 DIAGNOSIS — R5383 Other fatigue: Secondary | ICD-10-CM

## 2020-04-07 DIAGNOSIS — M353 Polymyalgia rheumatica: Secondary | ICD-10-CM | POA: Diagnosis not present

## 2020-04-07 LAB — COMPREHENSIVE METABOLIC PANEL
ALT: 11 U/L (ref 0–35)
AST: 18 U/L (ref 0–37)
Albumin: 4.3 g/dL (ref 3.5–5.2)
Alkaline Phosphatase: 62 U/L (ref 39–117)
BUN: 14 mg/dL (ref 6–23)
CO2: 29 mEq/L (ref 19–32)
Calcium: 9.9 mg/dL (ref 8.4–10.5)
Chloride: 100 mEq/L (ref 96–112)
Creatinine, Ser: 0.9 mg/dL (ref 0.40–1.20)
GFR: 54.84 mL/min — ABNORMAL LOW (ref >60.00)
Glucose, Bld: 96 mg/dL (ref 70–99)
Potassium: 5 mEq/L (ref 3.5–5.1)
Sodium: 134 mEq/L — ABNORMAL LOW (ref 135–145)
Total Bilirubin: 0.5 mg/dL (ref 0.2–1.2)
Total Protein: 6.4 g/dL (ref 6.0–8.3)

## 2020-04-07 LAB — T4, FREE: Free T4: 1.2 ng/dL (ref 0.60–1.60)

## 2020-04-07 LAB — CBC
HCT: 38.1 % (ref 36.0–46.0)
Hemoglobin: 13 g/dL (ref 12.0–15.0)
MCHC: 34.1 g/dL (ref 30.0–36.0)
MCV: 92.4 fl (ref 78.0–100.0)
Platelets: 209 10*3/uL (ref 150.0–400.0)
RBC: 4.13 Mil/uL (ref 3.87–5.11)
RDW: 12.8 % (ref 11.5–15.5)
WBC: 5.4 10*3/uL (ref 4.0–10.5)

## 2020-04-07 LAB — SEDIMENTATION RATE: Sed Rate: 14 mm/hr (ref 0–30)

## 2020-04-07 NOTE — Assessment & Plan Note (Signed)
Probably multifactorial Will check labs Again reviewed the concerns about social isolation--she is not ready for assisted living Discussed proper eating Try to do some leg strengthening--she will get rollator Minimize or stop the tylenol PM (discussed alternatives)

## 2020-04-07 NOTE — Assessment & Plan Note (Signed)
BP Readings from Last 3 Encounters:  04/07/20 138/86  01/09/20 (!) 150/80  10/08/19 122/80   Despite the dizziness, BP not generally low Will continue the losartan/HCTZ

## 2020-04-07 NOTE — Assessment & Plan Note (Signed)
No clear exacerbation  Will check sed rate and start prednisone if elevated

## 2020-04-07 NOTE — Patient Instructions (Signed)
Please try to stop or minimize the tylenol PM---this can have long term consequences (including fatigue and confusion). You can try sleep tyme tea and/or melatonin (try 3mg  but you can double it to 6mg  if needed).

## 2020-04-07 NOTE — Progress Notes (Signed)
Subjective:    [name], female    DOB: 02/10/1924, 87 y.o.   MRN: [id number]  HPI Here due to problems with a lack of energy This visit occurred during the SARS-CoV-2 public health emergency.  Safety protocols were in place, including screening questions prior to the visit, additional usage of staff PPE, and extensive cleaning of exam room while observing appropriate contact time as indicated for disinfecting solutions.   Has had some sleep problems Uses tylenol PM some times---trying to avoid nightly use. Also uses herbal tea Still with nocturia x 4-5--pessary doesn't seem to help  Concerned about her energy being down Will get up and feel dizzy at times  Needs rollator now Using cane now--but feels it will make her more stable  Staying at home mostly Not that comfortable going out---but does miss church, etc  Current Outpatient Medications on File Prior to Visit  Medication Sig Dispense Refill  . acetaminophen (TYLENOL) 325 MG tablet Take 650 mg by mouth every 6 (six) hours as needed.    . diphenhydramine-acetaminophen (TYLENOL PM) 25-500 MG TABS tablet Take 1 tablet by mouth at bedtime as needed.    Marland Kitchen losartan-hydrochlorothiazide (HYZAAR) 50-12.5 MG tablet TAKE 1 TABLET EVERY DAY 90 tablet 3  . Multiple Vitamin (MULTIVITAMIN) capsule Take 1 capsule by mouth daily.    . Naproxen Sodium 220 MG CAPS Take 1 capsule by mouth 2 (two) times daily as needed.    Marland Kitchen omeprazole (PRILOSEC) 20 MG capsule Take 20 mg by mouth daily.    Marland Kitchen OVER THE COUNTER MEDICATION Curcumin 2K Take 1 Daily     No current facility-administered medications on file prior to visit.    Allergies  Allergen Reactions  . Alendronate Sodium     REACTION: GI    Past Medical History:  Diagnosis Date  . Diverticulosis   . GERD (gastroesophageal reflux disease)   . Hyperlipidemia   . Hypertension   . Osteoporosis   . Polymyalgia rheumatica ([facility])     Past Surgical History:  Procedure  Laterality Date  . ANKLE FRACTURE SURGERY  07/12/2005  . CATARACT EXTRACTION  2012   right  . TONSILLECTOMY      Family History  Problem Relation Age of Onset  . Arthritis Mother   . Heart disease Father 55  . Diabetes Brother   . Cancer Sister        ovarian  . Stroke Sister   . Heart disease Daughter   . Rheum arthritis Daughter     Social History   Socioeconomic History  . Marital status: Widowed    Spouse name: Not on file  . Number of children: 2  . Years of education: Not on file  . Highest education level: Not on file  Occupational History  . Occupation: House wife, farming  Tobacco Use  . Smoking status: Never Smoker  . Smokeless tobacco: Never Used  Vaping Use  . Vaping Use: Never used  Substance and Sexual Activity  . Alcohol use: Yes    Alcohol/week: 0.0 standard drinks    Comment: wine  . Drug use: No  . Sexual activity: Not on file  Other Topics Concern  . Not on file  Social History Narrative   Has living will   1 living daughter is  health care POA.     Has DNR already   Request no feeding tube.  Does not want extended ventilator support.   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  Cardiovascular:     Rate and Rhythm: Normal rate and regular rhythm.     Heart sounds: No murmur heard. No gallop.   Pulmonary:     Effort: Pulmonary effort is normal.     Breath sounds: Normal breath sounds. No wheezing or rales.   Musculoskeletal:     Cervical back: Neck supple.  Lymphadenopathy:     Cervical: No cervical adenopathy.  Neurological:     Mental Status: She is alert.     Comments: No focal weakness but legs are weak--needs significant help getting on and off the table  Psychiatric:        Mood and Affect: Mood normal.        Behavior: Behavior normal.            Assessment & Plan:

## 2020-04-10 ENCOUNTER — Ambulatory Visit: Payer: Medicare HMO | Admitting: Obstetrics & Gynecology

## 2020-05-04 ENCOUNTER — Other Ambulatory Visit: Payer: Self-pay

## 2020-05-04 ENCOUNTER — Encounter: Payer: Self-pay | Admitting: Obstetrics & Gynecology

## 2020-05-04 ENCOUNTER — Ambulatory Visit (INDEPENDENT_AMBULATORY_CARE_PROVIDER_SITE_OTHER): Payer: Medicare HMO | Admitting: Obstetrics & Gynecology

## 2020-05-04 VITALS — BP 148/80 | Ht 60.0 in | Wt 155.0 lb

## 2020-05-04 DIAGNOSIS — N814 Uterovaginal prolapse, unspecified: Secondary | ICD-10-CM

## 2020-05-04 DIAGNOSIS — R339 Retention of urine, unspecified: Secondary | ICD-10-CM

## 2020-05-04 NOTE — Progress Notes (Signed)
HPI:      [name] is a 87 y.o. [id number] who presents today for her pessary follow up and examination related to her pelvic floor weakening.  Pt reports tolerating the pessary well with no vaginal bleeding and no vaginal discharge.  Symptoms of pelvic floor weakening have greatly improved. She is voiding and defecating without difficulty. She currently has a Ring #3 pessary.  PMHx: She  has a past medical history of Diverticulosis, GERD (gastroesophageal reflux disease), Hyperlipidemia, Hypertension, Osteoporosis, and Polymyalgia rheumatica ([place]). Also,  has a past surgical history that includes Tonsillectomy; Ankle fracture surgery (07/12/2005); and Cataract extraction (2012)., family history includes Arthritis in her mother; Cancer in her sister; Diabetes in her brother; Heart disease in her daughter; Heart disease (age of onset: 6) in her father; Rheum arthritis in her daughter; Stroke in her sister.,  reports that she has never smoked. She has never used smokeless tobacco. She reports current alcohol use. She reports that she does not use drugs.  She has a current medication list which includes the following prescription(s): acetaminophen, diphenhydramine-acetaminophen, losartan-hydrochlorothiazide, multivitamin, naproxen sodium, omeprazole, and OVER THE COUNTER MEDICATION. Also, is allergic to alendronate sodium.  Review of Systems  All other systems reviewed and are negative.   Objective: BP (!) 148/80   Ht 5' (1.524 m)   Wt 155 lb (70.3 kg)   BMI 30.27 kg/m  Physical Exam Constitutional:      General: She is not in acute distress.    Appearance: She is well-developed.  Genitourinary:     Bladder, vagina, uterus and urethral meatus normal.     No vaginal erythema or bleeding.     Anterior vaginal prolapse present.    Moderate vaginal atrophy present.     Right Adnexa: not tender and no mass present.    Left Adnexa: not tender and no mass present.    No cervical motion  tenderness, discharge, polyp or nabothian cyst.     Uterus is mobile and prolapsed.     Uterus is not enlarged.     No uterine mass detected.    Uterus is midaxial.     Bladder exam comments: Mild cystocele.     Pelvic Floor comments: Gr 4 uterine prolapse, no excoriations  Gr 2 proximal cystocele.    Pelvic exam was performed with patient in the lithotomy position.  HENT:     Head: Normocephalic and atraumatic.     Nose: Nose normal.  Abdominal:     General: There is no distension.     Palpations: Abdomen is soft.     Tenderness: There is no abdominal tenderness.  Musculoskeletal:        General: Normal range of motion.  Neurological:     Mental Status: She is alert and oriented to person, place, and time.     Cranial Nerves: No cranial nerve deficit.  Skin:    General: Skin is warm and dry.  Psychiatric:        Attention and Perception: Attention normal.        Mood and Affect: Mood and affect normal.        Speech: Speech normal.        Behavior: Behavior normal.        Thought Content: Thought content normal.        Judgment: Judgment normal.     Pessary Care Pessary removed and cleaned.  Vagina checked - without erosions - pessary replaced.  A/P:   ICD-10-CM  1. Uterine prolapse  N81.4   2. Urinary retention with incomplete bladder emptying  R33.9    Pessary was cleaned and replaced today. Instructions given for care. Concerning symptoms to observe for are counseled to patient. Follow up scheduled for 3 months.  A total of 20 minutes were spent face-to-face with the patient as well as preparation, review, communication, and documentation during this encounter.   Barnett Applebaum, MD, [facility], [facility] 05/04/2020  10:50 AM

## 2020-06-03 ENCOUNTER — Encounter: Payer: Self-pay | Admitting: Podiatry

## 2020-06-03 ENCOUNTER — Ambulatory Visit: Payer: Medicare HMO | Admitting: Podiatry

## 2020-06-03 ENCOUNTER — Other Ambulatory Visit: Payer: Self-pay

## 2020-06-03 DIAGNOSIS — M79675 Pain in left toe(s): Secondary | ICD-10-CM

## 2020-06-03 DIAGNOSIS — B351 Tinea unguium: Secondary | ICD-10-CM

## 2020-06-03 DIAGNOSIS — L600 Ingrowing nail: Secondary | ICD-10-CM

## 2020-06-03 DIAGNOSIS — M79674 Pain in right toe(s): Secondary | ICD-10-CM | POA: Diagnosis not present

## 2020-06-03 NOTE — Progress Notes (Signed)
  Subjective:  [name], female    DOB: January 17, 1924,  MRN: [id number]  Chief Complaint  Patient presents with  . Nail Problem    Patient presents today for ingrown toenails bilat hallux both borders x 1 month.  She says they are not to sore now but sensitive when she is in bed     87 y.o. female presents with the above complaint. History confirmed with patient.  The nails also hurt and she is unable to cut them consider so thick  Objective:  Physical Exam: warm, good capillary refill, no trophic changes or ulcerative lesions, normal DP and PT pulses and normal sensory exam.  Varicose veins present.  She has thickened elongated toenails with incurvated hallux nail borders, there is subungual debris on the remainder of the nails. Assessment:   1. Onychomycosis   2. Pain due to onychomycosis of toenails of both feet   3. Ingrowing right great toenail   4. Ingrowing left great toenail      Plan:  Patient was evaluated and treated and all questions answered.  Discussed the etiology and treatment options for the condition in detail with the patient. Educated patient on the topical and oral treatment options for mycotic nails. Recommended debridement of the nails today. Sharp and mechanical debridement performed of all painful and mycotic nails today. Nails debrided in length and thickness using a nail nipper to level of comfort. Discussed treatment options including appropriate shoe gear. Follow up as needed for painful nails.  The incurvated nail borders were primarily at the distal tips and corners of the nails.  I debrided these and sent back fashion.  If they become more symptomatic or still bother her we will consider permanent partial nail avulsion. Return in about 3 months (around 09/03/2020), or if symptoms worsen or fail to improve, for painful ingrowing nails.

## 2020-07-18 ENCOUNTER — Other Ambulatory Visit: Payer: Self-pay | Admitting: Internal Medicine

## 2020-08-12 ENCOUNTER — Encounter: Payer: Self-pay | Admitting: Obstetrics & Gynecology

## 2020-08-12 ENCOUNTER — Ambulatory Visit (INDEPENDENT_AMBULATORY_CARE_PROVIDER_SITE_OTHER): Payer: Medicare HMO | Admitting: Obstetrics & Gynecology

## 2020-08-12 ENCOUNTER — Other Ambulatory Visit: Payer: Self-pay

## 2020-08-12 VITALS — BP 130/80 | Ht 60.0 in | Wt 157.0 lb

## 2020-08-12 DIAGNOSIS — N814 Uterovaginal prolapse, unspecified: Secondary | ICD-10-CM

## 2020-08-12 DIAGNOSIS — R339 Retention of urine, unspecified: Secondary | ICD-10-CM | POA: Diagnosis not present

## 2020-08-12 NOTE — Progress Notes (Signed)
HPI:      [name] is a 87 y.o. [id number] who presents today for her pessary follow up and examination related to her pelvic floor weakening.  Pt reports tolerating the pessary well with no vaginal bleeding and no vaginal discharge.  Symptoms of pelvic floor weakening have greatly improved. She is voiding and defecating without difficulty. She currently has a #3Ring pessary.  PMHx: She  has a past medical history of Diverticulosis, GERD (gastroesophageal reflux disease), Hyperlipidemia, Hypertension, Osteoporosis, and Polymyalgia rheumatica ([place]). Also,  has a past surgical history that includes Tonsillectomy; Ankle fracture surgery (07/12/2005); and Cataract extraction (2012)., family history includes Arthritis in her mother; Cancer in her sister; Diabetes in her brother; Heart disease in her daughter; Heart disease (age of onset: 20) in her father; Rheum arthritis in her daughter; Stroke in her sister.,  reports that she has never smoked. She has never used smokeless tobacco. She reports current alcohol use. She reports that she does not use drugs.  She has a current medication list which includes the following prescription(s): diphenhydramine-acetaminophen, losartan-hydrochlorothiazide, multivitamin, naproxen sodium, omeprazole, and OVER THE COUNTER MEDICATION. Also, is allergic to alendronate sodium.  Review of Systems  All other systems reviewed and are negative.   Objective: BP 130/80   Ht 5' (1.524 m)   Wt 157 lb (71.2 kg)   BMI 30.66 kg/m  Physical Exam Constitutional:      General: She is not in acute distress.    Appearance: She is well-developed.  Genitourinary:     Bladder and urethral meatus normal.     No vaginal erythema or bleeding.     Anterior and posterior vaginal prolapse present.    Mild vaginal atrophy present.     Right Adnexa: not tender and no mass present.    Left Adnexa: not tender and no mass present.    No cervical motion tenderness, discharge,  polyp or nabothian cyst.     Uterus is prolapsed.     Uterus is not enlarged.     No uterine mass detected.    Uterus exam comments: Gr 3 POP.     Uterus is midaxial.     Bladder exam comments: Cystocele Gr 2.     Pelvic exam was performed with patient in the lithotomy position.  HENT:     Head: Normocephalic and atraumatic.     Nose: Nose normal.  Abdominal:     General: There is no distension.     Palpations: Abdomen is soft.     Tenderness: There is no abdominal tenderness.  Musculoskeletal:        General: Normal range of motion.  Neurological:     Mental Status: She is alert and oriented to person, place, and time.     Cranial Nerves: No cranial nerve deficit.  Skin:    General: Skin is warm and dry.  Psychiatric:        Attention and Perception: Attention normal.        Mood and Affect: Mood and affect normal.        Speech: Speech normal.        Behavior: Behavior normal.        Thought Content: Thought content normal.        Judgment: Judgment normal.     Pessary Care Pessary removed and cleaned.  Vagina checked - without erosions - pessary replaced.  A/P:   ICD-10-CM   1. Uterine prolapse  N81.4   2. Urinary retention  with incomplete bladder emptying  R33.9    Pessary was cleaned and replaced today. Instructions given for care. Concerning symptoms to observe for are counseled to patient. Follow up scheduled for 3 months.  A total of 21 minutes were spent face-to-face with the patient as well as preparation, review, communication, and documentation during this encounter.   Barnett Applebaum, MD, [facility], [facility] 08/12/2020  8:41 AM

## 2020-08-18 DIAGNOSIS — Z0100 Encounter for examination of eyes and vision without abnormal findings: Secondary | ICD-10-CM | POA: Diagnosis not present

## 2020-08-18 DIAGNOSIS — H40053 Ocular hypertension, bilateral: Secondary | ICD-10-CM | POA: Diagnosis not present

## 2020-08-18 DIAGNOSIS — H2512 Age-related nuclear cataract, left eye: Secondary | ICD-10-CM | POA: Diagnosis not present

## 2020-08-24 DIAGNOSIS — H2512 Age-related nuclear cataract, left eye: Secondary | ICD-10-CM | POA: Diagnosis not present

## 2020-08-26 ENCOUNTER — Encounter: Payer: Medicare HMO | Admitting: Internal Medicine

## 2020-08-26 ENCOUNTER — Encounter: Payer: Self-pay | Admitting: Ophthalmology

## 2020-09-07 ENCOUNTER — Ambulatory Visit: Payer: Medicare HMO | Admitting: Anesthesiology

## 2020-09-07 ENCOUNTER — Ambulatory Visit
Admission: RE | Admit: 2020-09-07 | Discharge: 2020-09-07 | Disposition: A | Discharge status: Home / Self Care | Payer: Medicare HMO | Attending: Ophthalmology | Admitting: Ophthalmology

## 2020-09-07 ENCOUNTER — Encounter: Payer: Self-pay | Admitting: Ophthalmology

## 2020-09-07 ENCOUNTER — Other Ambulatory Visit: Payer: Self-pay

## 2020-09-07 ENCOUNTER — Encounter
Admission: RE | Disposition: A | Discharge status: Home / Self Care | Payer: Self-pay | Source: Home / Self Care | Attending: Ophthalmology

## 2020-09-07 DIAGNOSIS — H25812 Combined forms of age-related cataract, left eye: Secondary | ICD-10-CM | POA: Diagnosis not present

## 2020-09-07 DIAGNOSIS — Z8249 Family history of ischemic heart disease and other diseases of the circulatory system: Secondary | ICD-10-CM | POA: Insufficient documentation

## 2020-09-07 DIAGNOSIS — Z79899 Other long term (current) drug therapy: Secondary | ICD-10-CM | POA: Diagnosis not present

## 2020-09-07 DIAGNOSIS — M353 Polymyalgia rheumatica: Secondary | ICD-10-CM | POA: Diagnosis not present

## 2020-09-07 DIAGNOSIS — Z8261 Family history of arthritis: Secondary | ICD-10-CM | POA: Diagnosis not present

## 2020-09-07 DIAGNOSIS — Z888 Allergy status to other drugs, medicaments and biological substances status: Secondary | ICD-10-CM | POA: Insufficient documentation

## 2020-09-07 DIAGNOSIS — Z833 Family history of diabetes mellitus: Secondary | ICD-10-CM | POA: Insufficient documentation

## 2020-09-07 DIAGNOSIS — H2512 Age-related nuclear cataract, left eye: Secondary | ICD-10-CM | POA: Insufficient documentation

## 2020-09-07 HISTORY — DX: Presence of dental prosthetic device (complete) (partial): Z97.2

## 2020-09-07 HISTORY — PX: CATARACT EXTRACTION W/PHACO: SHX586

## 2020-09-07 HISTORY — DX: Presence of external hearing-aid: Z97.4

## 2020-09-07 SURGERY — PHACOEMULSIFICATION, CATARACT, WITH IOL INSERTION
Anesthesia: Monitor Anesthesia Care | Site: Eye | Laterality: Left

## 2020-09-07 MED ORDER — NA HYALUR & NA CHOND-NA HYALUR 0.4-0.35 ML IO KIT
PACK | INTRAOCULAR | Status: DC | PRN
  Administered 2020-09-07: 1 mL via INTRAOCULAR

## 2020-09-07 MED ORDER — TETRACAINE HCL 0.5 % OP SOLN
1.0000 [drp] | OPHTHALMIC | Status: DC | PRN
  Administered 2020-09-07 (×3): 1 [drp] via OPHTHALMIC

## 2020-09-07 MED ORDER — EPINEPHRINE PF 1 MG/ML IJ SOLN
INTRAOCULAR | Status: DC | PRN
  Administered 2020-09-07: 74 mL via OPHTHALMIC

## 2020-09-07 MED ORDER — LACTATED RINGERS IV SOLN: INTRAVENOUS | Status: DC

## 2020-09-07 MED ORDER — CEFUROXIME OPHTHALMIC INJECTION 1 MG/0.1 ML
INJECTION | OPHTHALMIC | Status: DC | PRN
  Administered 2020-09-07: 0.1 mL via INTRACAMERAL

## 2020-09-07 MED ORDER — FENTANYL CITRATE (PF) 100 MCG/2ML IJ SOLN
INTRAMUSCULAR | Status: DC | PRN
  Administered 2020-09-07: 50 ug via INTRAVENOUS

## 2020-09-07 MED ORDER — ACETAMINOPHEN 500 MG PO TABS: 1000.0000 mg | ORAL_TABLET | Freq: Once | ORAL | Status: DC | PRN

## 2020-09-07 MED ORDER — BRIMONIDINE TARTRATE-TIMOLOL 0.2-0.5 % OP SOLN
OPHTHALMIC | Status: DC | PRN
  Administered 2020-09-07: 1 [drp] via OPHTHALMIC

## 2020-09-07 MED ORDER — ACETAMINOPHEN 160 MG/5ML PO SOLN: 975.0000 mg | Freq: Once | ORAL | Status: DC | PRN

## 2020-09-07 MED ORDER — ONDANSETRON HCL 4 MG/2ML IJ SOLN: 4.0000 mg | Freq: Once | INTRAMUSCULAR | Status: DC | PRN

## 2020-09-07 MED ORDER — LIDOCAINE HCL (PF) 2 % IJ SOLN
INTRAOCULAR | Status: DC | PRN
  Administered 2020-09-07: 2 mL

## 2020-09-07 MED ORDER — CYCLOPENTOLATE HCL 2 % OP SOLN
1.0000 [drp] | OPHTHALMIC | Status: AC
  Administered 2020-09-07 (×2): 1 [drp] via OPHTHALMIC

## 2020-09-07 MED ORDER — PHENYLEPHRINE HCL 10 % OP SOLN
1.0000 [drp] | OPHTHALMIC | Status: AC
  Administered 2020-09-07 (×3): 1 [drp] via OPHTHALMIC

## 2020-09-07 SURGICAL SUPPLY — 17 items
CANNULA ANT/CHMB 27G (MISCELLANEOUS) ×1 IMPLANT
CANNULA ANT/CHMB 27GA (MISCELLANEOUS) ×2 IMPLANT
GLOVE SURG ENC TEXT LTX SZ7.5 (GLOVE) ×2 IMPLANT
GLOVE SURG TRIUMPH 8.0 PF LTX (GLOVE) ×2 IMPLANT
GOWN STRL REUS W/ TWL LRG LVL3 (GOWN DISPOSABLE) ×2 IMPLANT
GOWN STRL REUS W/TWL LRG LVL3 (GOWN DISPOSABLE) ×4
LENS IOL ACRYSOF IQ 19.5 (Intraocular Lens) ×1 IMPLANT
MARKER SKIN DUAL TIP RULER LAB (MISCELLANEOUS) ×2 IMPLANT
NDL CAPSULORHEX 25GA (NEEDLE) ×1 IMPLANT
NEEDLE CAPSULORHEX 25GA (NEEDLE) ×2 IMPLANT
NEEDLE FILTER BLUNT 18X 1/2SAF (NEEDLE) ×2
NEEDLE FILTER BLUNT 18X1 1/2 (NEEDLE) ×2 IMPLANT
PACK EYE AFTER SURG (MISCELLANEOUS) ×2 IMPLANT
SYR 3ML LL SCALE MARK (SYRINGE) ×4 IMPLANT
SYR TB 1ML LUER SLIP (SYRINGE) ×2 IMPLANT
WATER STERILE IRR 250ML POUR (IV SOLUTION) ×2 IMPLANT
WIPE NON LINTING 3.25X3.25 (MISCELLANEOUS) ×2 IMPLANT

## 2020-09-07 NOTE — Anesthesia Postprocedure Evaluation (Signed)
Anesthesia Post Note  Patient: Ann Odom  Procedure(s) Performed: CATARACT EXTRACTION PHACO AND INTRAOCULAR LENS PLACEMENT (IOC) LEFT 8.30 01:09.7 (Left: Eye)     Patient location during evaluation: PACU Anesthesia Type: MAC Level of consciousness: awake and alert Pain management: pain level controlled Vital Signs Assessment: post-procedure vital signs reviewed and stable Respiratory status: spontaneous breathing, nonlabored ventilation, respiratory function stable and patient connected to nasal cannula oxygen Cardiovascular status: stable and blood pressure returned to baseline Postop Assessment: no apparent nausea or vomiting Anesthetic complications: no   No notable events documented.  April Manson

## 2020-09-07 NOTE — H&P (Signed)
[facility]   Primary Care Physician:  Venia Carbon, MD Ophthalmologist: Dr. Leandrew Koyanagi  Pre-Procedure History & Physical: HPI:  Ann Odom is a 87 y.o. female here for ophthalmic surgery.   Past Medical History:  Diagnosis Date   Diverticulosis    GERD (gastroesophageal reflux disease)    Hyperlipidemia    Hypertension    Osteoporosis    Polymyalgia rheumatica ([place])    Wears dentures    partial upper and lower   Wears hearing aid in both ears     Past Surgical History:  Procedure Laterality Date   ANKLE FRACTURE SURGERY  07/12/2005   CATARACT EXTRACTION  2012   right   TONSILLECTOMY      Prior to Admission medications   Medication Sig Start Date End Date Taking? Authorizing Provider  CANNABIDIOL PO Take by mouth as needed.   Yes [provider]  esomeprazole (NEXIUM) 20 MG capsule Take 20 mg by mouth daily at 12 noon.   Yes [provider]  losartan-hydrochlorothiazide (HYZAAR) 50-12.5 MG tablet TAKE 1 TABLET EVERY DAY 07/20/20  Yes Viviana Simpler I, MD  melatonin 5 MG TABS Take 5 mg by mouth at bedtime as needed.   Yes [provider]  Multiple Vitamin (MULTIVITAMIN) capsule Take 1 capsule by mouth daily.   Yes [provider]  Naproxen Sodium 220 MG CAPS Take 1 capsule by mouth 2 (two) times daily as needed.   Yes [provider]  OVER THE COUNTER MEDICATION Curcumin 2K Take 1 Daily   Yes [provider]    Allergies as of 08/19/2020 - Review Complete 08/12/2020  Allergen Reaction Noted   Alendronate sodium  10/10/2006    Family History  Problem Relation Age of Onset   Arthritis Mother    Heart disease Father 14   Diabetes Brother    Cancer Sister        ovarian   Stroke Sister    Heart disease Daughter    Rheum arthritis Daughter     Social History   Socioeconomic History   Marital status: Widowed    Spouse name: Not on file   Number of children: 2   Years of education:  Not on file   Highest education level: Not on file  Occupational History   Occupation: House wife, farming  Tobacco Use   Smoking status: Never   Smokeless tobacco: Never  Vaping Use   Vaping Use: Never used  Substance and Sexual Activity   Alcohol use: Yes    Alcohol/week: 0.0 standard drinks    Comment: wine   Drug use: No   Sexual activity: Not on file  Other Topics Concern   Not on file  Social History Narrative   Has living will   1 living daughter is  health care POA.     Has DNR already   Request no feeding tube.  Does not want extended ventilator support.   Social Determinants of Health   Financial Resource Strain: Not on file  Food Insecurity: Not on file  Transportation Needs: Not on file  Physical Activity: Not on file  Stress: Not on file  Social Connections: Not on file  Intimate Partner Violence: Not on file    Review of Systems: See HPI, otherwise negative ROS  Physical Exam: BP (!) 174/68   Pulse 72   Temp 98.4 F (36.9 C)   Resp (!) 22   Ht 5' (1.524 m)   Wt 68 kg  SpO2 99%   BMI 29.29 kg/m  General:   Alert,  pleasant and cooperative in NAD Head:  Normocephalic and atraumatic. Lungs:  Clear to auscultation.    Heart:  Regular rate and rhythm.   Impression/Plan: Milus Mallick is here for ophthalmic surgery.  Risks, benefits, limitations, and alternatives regarding ophthalmic surgery have been reviewed with the patient.  Questions have been answered.  All parties agreeable.   Leandrew Koyanagi, MD  09/07/2020, 9:39 AM

## 2020-09-07 NOTE — Anesthesia Preprocedure Evaluation (Signed)
Anesthesia Evaluation  Patient identified by MRN, date of birth, ID band Patient awake    Reviewed: Allergy & Precautions, H&P , NPO status , Patient's Chart, lab work & pertinent test results, reviewed documented beta blocker date and time   Airway Mallampati: II  TM Distance: >3 FB Neck ROM: full    Dental no notable dental hx.    Pulmonary neg pulmonary ROS,    Pulmonary exam normal breath sounds clear to auscultation       Cardiovascular hypertension,  Rhythm:regular Rate:Normal     Neuro/Psych negative neurological ROS  negative psych ROS   GI/Hepatic Neg liver ROS, GERD  ,  Endo/Other  negative endocrine ROS  Renal/GU negative Renal ROS  negative genitourinary   Musculoskeletal   Abdominal   Peds  Hematology   Anesthesia Other Findings Polymyalgia rheumatica  Reproductive/Obstetrics negative OB ROS                             Anesthesia Physical Anesthesia Plan  ASA: 3  Anesthesia Plan: MAC   Post-op Pain Management:    Induction:   PONV Risk Score and Plan: Treatment may vary due to age or medical condition  Airway Management Planned:   Additional Equipment:   Intra-op Plan:   Post-operative Plan:   Informed Consent: I have reviewed the patients History and Physical, chart, labs and discussed the procedure including the risks, benefits and alternatives for the proposed anesthesia with the patient or authorized representative who has indicated his/her understanding and acceptance.       Plan Discussed with: CRNA  Anesthesia Plan Comments:         Anesthesia Quick Evaluation

## 2020-09-07 NOTE — Op Note (Signed)
OPERATIVE NOTE  Ann Odom [id number] 09/07/2020   PREOPERATIVE DIAGNOSIS:  Nuclear sclerotic cataract left eye. H25.12   POSTOPERATIVE DIAGNOSIS:    Nuclear sclerotic cataract left eye.     PROCEDURE:  Phacoemusification with posterior chamber intraocular lens placement of the left eye  Ultrasound time: Procedure(s) with comments: CATARACT EXTRACTION PHACO AND INTRAOCULAR LENS PLACEMENT (IOC) LEFT 8.30 01:09.7 (Left) - requests early as possible  LENS:   Implant Name Type Inv. Item Serial No. Manufacturer Lot No. LRB No. Used Action  LENS IOL ACRYSOF IQ 19.5 - [id number] Intraocular Lens LENS IOL ACRYSOF IQ 19.5 [id number] ALCON  Left 1 Implanted      SURGEON:  Wyonia Hough, MD   ANESTHESIA:  Topical with tetracaine drops and 2% Xylocaine jelly, augmented with 1% preservative-free intracameral lidocaine.    COMPLICATIONS:  None.   DESCRIPTION OF PROCEDURE:  The patient was identified in the holding room and transported to the operating room and placed in the supine position under the operating microscope.  The left eye was identified as the operative eye and it was prepped and draped in the usual sterile ophthalmic fashion.   A 1 millimeter clear-corneal paracentesis was made at the 1:30 position.  0.5 ml of preservative-free 1% lidocaine was injected into the anterior chamber.  The anterior chamber was filled with Viscoat viscoelastic.  A 2.4 millimeter keratome was used to make a near-clear corneal incision at the 10:30 position.  .  A curvilinear capsulorrhexis was made with a cystotome and capsulorrhexis forceps.  Balanced salt solution was used to hydrodissect and hydrodelineate the nucleus.   Phacoemulsification was then used in stop and chop fashion to remove the lens nucleus and epinucleus.  The remaining cortex was then removed using the irrigation and aspiration handpiece. Provisc was then placed into the capsular bag to distend it for lens placement.  A lens  was then injected into the capsular bag.  The remaining viscoelastic was aspirated.   Wounds were hydrated with balanced salt solution.  The anterior chamber was inflated to a physiologic pressure with balanced salt solution.  No wound leaks were noted. Cefuroxime 0.1 ml of a 10mg /ml solution was injected into the anterior chamber for a dose of 1 mg of intracameral antibiotic at the completion of the case.   Timolol and Brimonidine drops were applied to the eye.  The patient was taken to the recovery room in stable condition without complications of anesthesia or surgery.  Ulric Salzman 09/07/2020, 10:31 AM

## 2020-09-07 NOTE — Transfer of Care (Signed)
Immediate Anesthesia Transfer of Care Note  Patient: Ann Odom  Procedure(s) Performed: CATARACT EXTRACTION PHACO AND INTRAOCULAR LENS PLACEMENT (IOC) LEFT 8.30 01:09.7 (Left: Eye)  Patient Location: PACU  Anesthesia Type: MAC  Level of Consciousness: awake, alert  and patient cooperative  Airway and Oxygen Therapy: Patient Spontanous Breathing and Patient connected to supplemental oxygen  Post-op Assessment: Post-op Vital signs reviewed, Patient's Cardiovascular Status Stable, Respiratory Function Stable, Patent Airway and No signs of Nausea or vomiting  Post-op Vital Signs: Reviewed and stable  Complications: No notable events documented.

## 2020-09-21 ENCOUNTER — Encounter: Payer: Self-pay | Admitting: Ophthalmology

## 2020-10-02 ENCOUNTER — Encounter: Payer: Medicare HMO | Admitting: Internal Medicine

## 2020-10-13 DIAGNOSIS — Z961 Presence of intraocular lens: Secondary | ICD-10-CM | POA: Diagnosis not present

## 2020-11-06 ENCOUNTER — Ambulatory Visit (INDEPENDENT_AMBULATORY_CARE_PROVIDER_SITE_OTHER): Payer: Medicare HMO | Admitting: Internal Medicine

## 2020-11-06 ENCOUNTER — Other Ambulatory Visit: Payer: Self-pay

## 2020-11-06 ENCOUNTER — Encounter: Payer: Self-pay | Admitting: Internal Medicine

## 2020-11-06 VITALS — BP 122/84 | HR 72 | Temp 97.0°F | Ht 60.0 in | Wt 153.0 lb

## 2020-11-06 DIAGNOSIS — M159 Polyosteoarthritis, unspecified: Secondary | ICD-10-CM

## 2020-11-06 DIAGNOSIS — I10 Essential (primary) hypertension: Secondary | ICD-10-CM

## 2020-11-06 DIAGNOSIS — Z0000 Encounter for general adult medical examination without abnormal findings: Secondary | ICD-10-CM | POA: Diagnosis not present

## 2020-11-06 DIAGNOSIS — Z23 Encounter for immunization: Secondary | ICD-10-CM

## 2020-11-06 DIAGNOSIS — Z7189 Other specified counseling: Secondary | ICD-10-CM

## 2020-11-06 DIAGNOSIS — M353 Polymyalgia rheumatica: Secondary | ICD-10-CM | POA: Diagnosis not present

## 2020-11-06 DIAGNOSIS — K219 Gastro-esophageal reflux disease without esophagitis: Secondary | ICD-10-CM | POA: Diagnosis not present

## 2020-11-06 LAB — COMPREHENSIVE METABOLIC PANEL
ALT: 11 U/L (ref 0–35)
AST: 19 U/L (ref 0–37)
Albumin: 4 g/dL (ref 3.5–5.2)
Alkaline Phosphatase: 54 U/L (ref 39–117)
BUN: 16 mg/dL (ref 6–23)
CO2: 27 mEq/L (ref 19–32)
Calcium: 10 mg/dL (ref 8.4–10.5)
Chloride: 99 mEq/L (ref 96–112)
Creatinine, Ser: 0.86 mg/dL (ref 0.40–1.20)
GFR: 57.68 mL/min — ABNORMAL LOW (ref >60.00)
Glucose, Bld: 95 mg/dL (ref 70–99)
Potassium: 4.2 mEq/L (ref 3.5–5.1)
Sodium: 134 mEq/L — ABNORMAL LOW (ref 135–145)
Total Bilirubin: 0.5 mg/dL (ref 0.2–1.2)
Total Protein: 6.7 g/dL (ref 6.0–8.3)

## 2020-11-06 LAB — CBC
HCT: 38.8 % (ref 36.0–46.0)
Hemoglobin: 13.2 g/dL (ref 12.0–15.0)
MCHC: 34.1 g/dL (ref 30.0–36.0)
MCV: 91.7 fl (ref 78.0–100.0)
Platelets: 210 10*3/uL (ref 150.0–400.0)
RBC: 4.23 Mil/uL (ref 3.87–5.11)
RDW: 12.9 % (ref 11.5–15.5)
WBC: 5.8 10*3/uL (ref 4.0–10.5)

## 2020-11-06 LAB — SEDIMENTATION RATE: Sed Rate: 9 mm/hr (ref 0–30)

## 2020-11-06 NOTE — Assessment & Plan Note (Signed)
Quiet on low dose nexium

## 2020-11-06 NOTE — Assessment & Plan Note (Signed)
I have personally reviewed the Medicare Annual Wellness questionnaire and have noted 1. The patient's medical and social history 2. Their use of alcohol, tobacco or illicit drugs 3. Their current medications and supplements 4. The patient's functional ability including ADL's, fall risks, home safety risks and hearing or visual             impairment. 5. Diet and physical activities 6. Evidence for depression or mood disorders  The patients weight, height, BMI and visual acuity have been recorded in the chart I have made referrals, counseling and provided education to the patient based review of the above and I have provided the pt with a written personalized care plan for preventive services.  I have provided you with a copy of your personalized plan for preventive services. Please take the time to review along with your updated medication list.  No cancer screening due to age Flu vaccine today Bivalent COVID when available Discussed working on muscle strength

## 2020-11-06 NOTE — Progress Notes (Signed)
Hearing Screening - Comments:: Has hearing aids. Wearing them today. Vision Screening - Comments:: July 2022

## 2020-11-06 NOTE — Progress Notes (Signed)
Subjective:    [name], female    DOB: 04-18-1923, 87 y.o.   MRN: [id number]  HPI Here for Medicare wellness visit and follow up of chronic health conditions This visit occurred during the SARS-CoV-2 public health emergency.  Safety protocols were in place, including screening questions prior to the visit, additional usage of staff PPE, and extensive cleaning of exam room while observing appropriate contact time as indicated for disinfecting solutions.   Reviewed form and advanced directives Reviewed other doctors Occasional glass of wine No tobacco Not really exercising--discussed Has hearing aides--still with hearing problems Vision is okay--did have cataract on left removed Does local driving--has aide twice a week (can help with shopping) Independent with instrumental ADLs No falls--uses 3 and 4 wheeled rollators No sig memory issues--mild recall problems  Feels better  Energy is better  No chest pain--but occasional temple pain No general achiness No SOB No dizziness or syncope No edema  No heartburn on PPI No dysphagia Occasional dry throat--better with a cough drop  Gets pessary change from Dr Kenton Kingfisher every 3 months Not overly helpful but does prevent prolapse Nocturia x 1 at least. Wears pad when out for some incontinence  Current Outpatient Medications on File Prior to Visit  Medication Sig Dispense Refill   esomeprazole (NEXIUM) 20 MG capsule Take 20 mg by mouth daily at 12 noon.     losartan-hydrochlorothiazide (HYZAAR) 50-12.5 MG tablet TAKE 1 TABLET EVERY DAY 90 tablet 0   melatonin 5 MG TABS Take 5 mg by mouth at bedtime as needed.     Multiple Vitamin (MULTIVITAMIN) capsule Take 1 capsule by mouth daily.     Naproxen Sodium 220 MG CAPS Take 1 capsule by mouth 2 (two) times daily as needed.     OVER THE COUNTER MEDICATION Curcumin 2K Take 1 Daily     UNABLE TO FIND Med Name: CBD Oil     No current facility-administered medications on  file prior to visit.    Allergies  Allergen Reactions   Alendronate Sodium     REACTION: GI    Past Medical History:  Diagnosis Date   Diverticulosis    GERD (gastroesophageal reflux disease)    Hyperlipidemia    Hypertension    Osteoporosis    Polymyalgia rheumatica ([place])    Wears dentures    partial upper and lower   Wears hearing aid in both ears     Past Surgical History:  Procedure Laterality Date   ANKLE FRACTURE SURGERY  07/12/2005   CATARACT EXTRACTION  2012   right   CATARACT EXTRACTION W/PHACO Left 09/07/2020   Procedure: CATARACT EXTRACTION PHACO AND INTRAOCULAR LENS PLACEMENT ([place]) LEFT 8.30 01:09.7;  Surgeon: Leandrew Koyanagi, MD;  Location: [facility];  Service: Ophthalmology;  Laterality: Left;  requests early as possible   TONSILLECTOMY      Family History  Problem Relation Age of Onset   Arthritis Mother    Heart disease Father 43   Diabetes Brother    Cancer Sister        ovarian   Stroke Sister    Heart disease Daughter    Rheum arthritis Daughter     Social History   Socioeconomic History   Marital status: Widowed    Spouse name: Not on file   Number of children: 2   Years of education: Not on file   Highest education level: Not on file  Occupational History   Occupation: House wife, farming  Tobacco Use   Smoking status: Never   Smokeless tobacco: Never  Vaping Use   Vaping Use: Never used  Substance and Sexual Activity   Alcohol use: Yes    Alcohol/week: 0.0 standard drinks    Comment: wine   Drug use: No   Sexual activity: Not on file  Other Topics Concern   Not on file  Social History Narrative   Has living will   1 living daughter is  health care POA.     Has DNR already   Request no feeding tube.  Does not want extended ventilator support.   Social Determinants of Health   Financial Resource Strain: Not on file  Food Insecurity: Not on file  Transportation Needs: Not on file  Physical Activity: Not on  file  Stress: Not on file  Social Connections: Not on file  Intimate Partner Violence: Not on file   Review of Systems Appetite is okay Weight is stable Wears seat belt Teeth need some work--sees dentist Scattered arthritis--like in fingers and back--better with CBD and aleve Sleeps okay--still needs the melatonin Moles "everywhere"---sees derm     Objective:   Physical Exam Constitutional:      Appearance: Normal appearance.  HENT:     Mouth/Throat:     Comments: No lesions Eyes:     Conjunctiva/sclera: Conjunctivae normal.     Pupils: Pupils are equal, round, and reactive to light.  Cardiovascular:     Rate and Rhythm: Normal rate and regular rhythm.     Pulses: Normal pulses.     Heart sounds: No murmur heard.   No gallop.  Pulmonary:     Effort: Pulmonary effort is normal.     Breath sounds: Normal breath sounds. No wheezing or rales.  Abdominal:     Palpations: Abdomen is soft.     Tenderness: There is no abdominal tenderness.  Musculoskeletal:     Cervical back: Neck supple.     Right lower leg: No edema.     Left lower leg: No edema.  Lymphadenopathy:     Cervical: No cervical adenopathy.  Skin:    General: Skin is warm.     Findings: No rash.  Neurological:     Mental Status: She is alert and oriented to person, place, and time.     Comments: President---"Biden, Trump, Clinton----Bush---?" [id number] D-l-[id number]-w Recall 3/3  Psychiatric:        Mood and Affect: Mood normal.        Behavior: Behavior normal.           Assessment & Plan:

## 2020-11-06 NOTE — Assessment & Plan Note (Signed)
BP Readings from Last 3 Encounters:  11/06/20 122/84  09/07/20 (!) 150/63  08/12/20 130/80   Good control on losartan HCTZ Will check labs

## 2020-11-06 NOTE — Assessment & Plan Note (Signed)
Does well with CBD oil and occasional aleve

## 2020-11-06 NOTE — Assessment & Plan Note (Signed)
Has DNR 

## 2020-11-06 NOTE — Assessment & Plan Note (Signed)
Still seems to be quiet Will check sed rate

## 2020-12-08 DIAGNOSIS — D225 Melanocytic nevi of trunk: Secondary | ICD-10-CM | POA: Diagnosis not present

## 2020-12-08 DIAGNOSIS — D2271 Melanocytic nevi of right lower limb, including hip: Secondary | ICD-10-CM | POA: Diagnosis not present

## 2020-12-08 DIAGNOSIS — D2261 Melanocytic nevi of right upper limb, including shoulder: Secondary | ICD-10-CM | POA: Diagnosis not present

## 2020-12-08 DIAGNOSIS — L821 Other seborrheic keratosis: Secondary | ICD-10-CM | POA: Diagnosis not present

## 2020-12-08 DIAGNOSIS — D2262 Melanocytic nevi of left upper limb, including shoulder: Secondary | ICD-10-CM | POA: Diagnosis not present

## 2020-12-08 DIAGNOSIS — D2272 Melanocytic nevi of left lower limb, including hip: Secondary | ICD-10-CM | POA: Diagnosis not present

## 2021-02-22 ENCOUNTER — Telehealth: Payer: Self-pay

## 2021-02-23 MED ORDER — TRUE METRIX METER W/DEVICE KIT: 1.0000 | PACK | Freq: Once | 0 refills | Status: AC

## 2021-02-23 MED ORDER — TRUE METRIX BLOOD GLUCOSE TEST VI STRP: ORAL_STRIP | 12 refills | Status: DC

## 2021-02-23 MED ORDER — TRUEPLUS LANCETS 33G MISC: 3 refills | Status: DC

## 2021-02-23 NOTE — Telephone Encounter (Signed)
Rx sent electronically.  

## 2021-04-16 DIAGNOSIS — Z961 Presence of intraocular lens: Secondary | ICD-10-CM | POA: Diagnosis not present

## 2021-04-16 DIAGNOSIS — H40053 Ocular hypertension, bilateral: Secondary | ICD-10-CM | POA: Diagnosis not present

## 2021-04-16 DIAGNOSIS — H524 Presbyopia: Secondary | ICD-10-CM | POA: Diagnosis not present

## 2021-04-19 DIAGNOSIS — M2041 Other hammer toe(s) (acquired), right foot: Secondary | ICD-10-CM | POA: Diagnosis not present

## 2021-04-19 DIAGNOSIS — M2042 Other hammer toe(s) (acquired), left foot: Secondary | ICD-10-CM | POA: Diagnosis not present

## 2021-04-19 DIAGNOSIS — L603 Nail dystrophy: Secondary | ICD-10-CM | POA: Diagnosis not present

## 2021-04-26 ENCOUNTER — Other Ambulatory Visit: Payer: Self-pay | Admitting: Internal Medicine

## 2021-05-19 ENCOUNTER — Encounter: Payer: Self-pay | Admitting: Obstetrics & Gynecology

## 2021-05-19 ENCOUNTER — Ambulatory Visit: Payer: Medicare HMO | Admitting: Obstetrics & Gynecology

## 2021-05-19 ENCOUNTER — Other Ambulatory Visit: Payer: Self-pay

## 2021-05-19 VITALS — BP 140/80 | Ht 60.0 in | Wt 145.0 lb

## 2021-05-19 DIAGNOSIS — R339 Retention of urine, unspecified: Secondary | ICD-10-CM | POA: Diagnosis not present

## 2021-05-19 DIAGNOSIS — N814 Uterovaginal prolapse, unspecified: Secondary | ICD-10-CM

## 2021-05-19 NOTE — Progress Notes (Signed)
?HPI: ?     Ann Odom is a 88 y.o. [id number] who presents today for her pessary follow up and examination related to her pelvic floor weakening.  Pt reports tolerating the pessary well with no vaginal bleeding and no vaginal discharge.  Symptoms of pelvic floor weakening have greatly improved. She is voiding and defecating without difficulty. She currently has a Ring type pessary #3. ? ?PMHx: ?She  has a past medical history of Diverticulosis, GERD (gastroesophageal reflux disease), Hyperlipidemia, Hypertension, Osteoporosis, Polymyalgia rheumatica ([place]), Wears dentures, and Wears hearing aid in both ears. Also,  has a past surgical history that includes Tonsillectomy; Ankle fracture surgery (07/12/2005); Cataract extraction (2012); and Cataract extraction w/PHACO (Left, 09/07/2020)., family history includes Arthritis in her mother; Cancer in her sister; Diabetes in her brother; Heart disease in her daughter; Heart disease (age of onset: 42) in her father; Rheum arthritis in her daughter; Stroke in her sister.,  reports that she has never smoked. She has never used smokeless tobacco. She reports current alcohol use. She reports that she does not use drugs. ? ?She has a current medication list which includes the following prescription(s): esomeprazole, true metrix blood glucose test, losartan-hydrochlorothiazide, melatonin, multivitamin, naproxen sodium, OVER THE COUNTER MEDICATION, trueplus lancets 33g, and UNABLE TO FIND. Also, is allergic to alendronate sodium. ? ?Review of Systems  ?All other systems reviewed and are negative. ? ?Objective: ?BP 140/80   Ht 5' (1.524 m)   Wt 145 lb (65.8 kg)   BMI 28.32 kg/m?  ?Physical Exam ?Constitutional:   ?   General: She is not in acute distress. ?   Appearance: She is well-developed.  ?Genitourinary:  ?   Right Labia: No rash or tenderness. ?   Left Labia: No tenderness or rash. ?   No vaginal erythema or bleeding.  ?   Mild vaginal atrophy present. ? ?   Right  Adnexa: not tender and no mass present. ?   Left Adnexa: not tender and no mass present. ?   No cervical motion tenderness, discharge, polyp or nabothian cyst.  ?   Uterus is prolapsed.  ?   Uterus is not enlarged.  ?   No uterine mass detected. ?   Uterus exam comments: Gr3 prolapse.  ?   Bladder exam comments: Gr2 cystocele.  ?   Pelvic exam was performed with patient in the lithotomy position.  ?Rectum:  ?   Rectal exam comments: Gr1 rectocele.  ?HENT:  ?   Head: Normocephalic and atraumatic.  ?   Nose: Nose normal.  ?Abdominal:  ?   General: There is no distension.  ?   Palpations: Abdomen is soft.  ?   Tenderness: There is no abdominal tenderness.  ?Musculoskeletal:     ?   General: Normal range of motion.  ?Neurological:  ?   Mental Status: She is alert and oriented to person, place, and time.  ?   Cranial Nerves: No cranial nerve deficit.  ?Skin: ?   General: Skin is warm and dry.  ?Psychiatric:     ?   Attention and Perception: Attention normal.     ?   Mood and Affect: Mood and affect normal.     ?   Speech: Speech normal.     ?   Behavior: Behavior normal.     ?   Thought Content: Thought content normal.     ?   Judgment: Judgment normal.  ? ? ?Pessary Care ?Pessary removed and cleaned.  Vagina checked - without erosions - pessary replaced. ? ?A/P: ?  ICD-10-CM   ?1. Uterine prolapse  N81.4   ?  ?2. Urinary retention with incomplete bladder emptying  R33.9   ?  ? ?Pessary was cleaned and replaced today. ?Instructions given for care. ?Concerning symptoms to observe for are counseled to patient. ?Follow up scheduled for 3 months. ? ?A total of 20 minutes were spent face-to-face with the patient as well as preparation, review, communication, and documentation during this encounter.  ? ?Barnett Applebaum, MD, [place] ?[facility], [place] ?05/19/2021  10:59 AM ? ?

## 2021-10-18 ENCOUNTER — Ambulatory Visit: Payer: Medicare HMO | Admitting: Obstetrics & Gynecology

## 2021-10-18 ENCOUNTER — Encounter: Payer: Self-pay | Admitting: Obstetrics & Gynecology

## 2021-10-18 VITALS — BP 140/70 | Ht 60.0 in | Wt 143.0 lb

## 2021-10-18 DIAGNOSIS — R339 Retention of urine, unspecified: Secondary | ICD-10-CM | POA: Diagnosis not present

## 2021-10-18 DIAGNOSIS — Z4689 Encounter for fitting and adjustment of other specified devices: Secondary | ICD-10-CM

## 2021-10-18 DIAGNOSIS — N814 Uterovaginal prolapse, unspecified: Secondary | ICD-10-CM

## 2021-10-18 NOTE — Progress Notes (Signed)
   Subjective:    [name], female    DOB: 1923/06/24, 88 y.o.   MRN: [id number]  HPI  Ann Odom is a 88 y.o. [id number] who presents today for her pessary follow up and examination related to her pelvic floor weakening.  Pt reports tolerating the pessary well with no vaginal bleeding and no vaginal discharge.  Symptoms of pelvic floor weakening have greatly improved. She is voiding and defecating without difficulty. She currently has a Ring type pessary #3.   Her last visit here was in March of this year.  Review of Systems She lives in [place] and her niece is her helper. She has to get up to void about 3 times most nights.     Objective:   Physical Exam Well nourished, well hydrated White female, no apparent distress She is conversing normally. She uses a walker.  Genitourinary:             External: Normal external female genitalia.  Normal urethral meatus, normal Bartholin's and Skene's glands.               Vagina with Graves speculum:  Normal vaginal mucosa, no excoriations, moderate atrophy             Cervix: Grossly normal in appearance, no bleeding             Uterus: Non-enlarged, mobile, normal contour.  No CMT                       Rectal: deferred  I removed the pessary and it had a green discharge and was malodorous. I cleaned it thoroughly.      Assessment & Plan:   Uterine prolapse and urinary retention- continue pessary Rec maintenance visits every 3 months.

## 2021-10-25 ENCOUNTER — Other Ambulatory Visit: Payer: Self-pay | Admitting: Internal Medicine

## 2021-11-12 ENCOUNTER — Encounter: Payer: Medicare HMO | Admitting: Internal Medicine

## 2021-12-07 DIAGNOSIS — I70209 Unspecified atherosclerosis of native arteries of extremities, unspecified extremity: Secondary | ICD-10-CM | POA: Diagnosis not present

## 2021-12-07 DIAGNOSIS — I7091 Generalized atherosclerosis: Secondary | ICD-10-CM | POA: Diagnosis not present

## 2021-12-07 DIAGNOSIS — B351 Tinea unguium: Secondary | ICD-10-CM | POA: Diagnosis not present

## 2022-01-06 ENCOUNTER — Encounter: Payer: Self-pay | Admitting: Internal Medicine

## 2022-01-06 ENCOUNTER — Ambulatory Visit (INDEPENDENT_AMBULATORY_CARE_PROVIDER_SITE_OTHER): Payer: Medicare HMO | Admitting: Internal Medicine

## 2022-01-06 VITALS — BP 116/66 | HR 60 | Temp 97.7°F | Ht 58.25 in | Wt 142.0 lb

## 2022-01-06 DIAGNOSIS — I10 Essential (primary) hypertension: Secondary | ICD-10-CM

## 2022-01-06 DIAGNOSIS — N814 Uterovaginal prolapse, unspecified: Secondary | ICD-10-CM | POA: Diagnosis not present

## 2022-01-06 DIAGNOSIS — M159 Polyosteoarthritis, unspecified: Secondary | ICD-10-CM | POA: Diagnosis not present

## 2022-01-06 DIAGNOSIS — K219 Gastro-esophageal reflux disease without esophagitis: Secondary | ICD-10-CM

## 2022-01-06 DIAGNOSIS — M353 Polymyalgia rheumatica: Secondary | ICD-10-CM

## 2022-01-06 DIAGNOSIS — Z7189 Other specified counseling: Secondary | ICD-10-CM

## 2022-01-06 DIAGNOSIS — Z0000 Encounter for general adult medical examination without abnormal findings: Secondary | ICD-10-CM

## 2022-01-06 LAB — COMPREHENSIVE METABOLIC PANEL
ALT: 9 U/L (ref 0–35)
AST: 18 U/L (ref 0–37)
Albumin: 4.1 g/dL (ref 3.5–5.2)
Alkaline Phosphatase: 47 U/L (ref 39–117)
BUN: 14 mg/dL (ref 6–23)
CO2: 28 mEq/L (ref 19–32)
Calcium: 9.6 mg/dL (ref 8.4–10.5)
Chloride: 97 mEq/L (ref 96–112)
Creatinine, Ser: 0.82 mg/dL (ref 0.40–1.20)
GFR: 60.57 mL/min (ref >60.00)
Glucose, Bld: 92 mg/dL (ref 70–99)
Potassium: 4.3 mEq/L (ref 3.5–5.1)
Sodium: 131 mEq/L — ABNORMAL LOW (ref 135–145)
Total Bilirubin: 0.4 mg/dL (ref 0.2–1.2)
Total Protein: 6.4 g/dL (ref 6.0–8.3)

## 2022-01-06 LAB — CBC
HCT: 37.3 % (ref 36.0–46.0)
Hemoglobin: 12.6 g/dL (ref 12.0–15.0)
MCHC: 33.8 g/dL (ref 30.0–36.0)
MCV: 92.6 fl (ref 78.0–100.0)
Platelets: 208 10*3/uL (ref 150.0–400.0)
RBC: 4.03 Mil/uL (ref 3.87–5.11)
RDW: 12.8 % (ref 11.5–15.5)
WBC: 5.1 10*3/uL (ref 4.0–10.5)

## 2022-01-06 LAB — SEDIMENTATION RATE: Sed Rate: 9 mm/hr (ref 0–30)

## 2022-01-06 NOTE — Progress Notes (Addendum)
Subjective:    [name], female    DOB: 07/13/1923, 88 y.o.   MRN: [id number]  HPI Here for Medicare wellness visit and follow up of chronic health conditions Reviewed advanced directives Reviewed other doctors---Dr Dove--gyn, Dr Jola Babinski, Dr Archie Balboa, Ms Sandridge--derm, Dr Quintella Reichert No hospitalizations or surgery this year Vision has faded some Hearing is poor---only some help with aides Does some sitting exercises Occasional glass of wine No tobacco No falls No depression or anhedonia No sig memory issues  Doing well Walks with 3 wheeled rollator Drives locally----has lots of help Aide comes every 2 weeks to do her shopping ---once a month heavy housekeeping She does cooking, dishes, laundry  No muscle aching or signs of PMR  No chest pain No SOB No dizziness or syncope No edema No headaches No palpitations  Goes to gyn Has pessary that is changed every 3 months Voids okay---wears pad for occ leakage  Takes nexium daily No heartburn on this No dysphagia  Last GFR 57  Current Outpatient Medications on File Prior to Visit  Medication Sig Dispense Refill   esomeprazole (NEXIUM) 20 MG capsule Take 20 mg by mouth daily at 12 noon.     glucose blood (TRUE METRIX BLOOD GLUCOSE TEST) test strip Use to check blood sugar once daily 100 each 12   losartan-hydrochlorothiazide (HYZAAR) 50-12.5 MG tablet TAKE 1 TABLET EVERY DAY 90 tablet 0   melatonin 5 MG TABS Take 5 mg by mouth at bedtime as needed.     Multiple Vitamin (MULTIVITAMIN) capsule Take 1 capsule by mouth daily.     Naproxen Sodium 220 MG CAPS Take 1 capsule by mouth 2 (two) times daily as needed.     OVER THE COUNTER MEDICATION Curcumin 2K Take 1 Daily     TRUEplus Lancets 33G MISC Use to obtain blood sugar sample once daily 100 each 3   UNABLE TO FIND Med Name: CBD Oil     No current facility-administered medications on file prior to visit.    Allergies   Allergen Reactions   Alendronate Sodium     REACTION: GI    Past Medical History:  Diagnosis Date   Diverticulosis    GERD (gastroesophageal reflux disease)    Hyperlipidemia    Hypertension    Osteoporosis    Polymyalgia rheumatica ([place])    Wears dentures    partial upper and lower   Wears hearing aid in both ears     Past Surgical History:  Procedure Laterality Date   ANKLE FRACTURE SURGERY  07/12/2005   CATARACT EXTRACTION  2012   right   CATARACT EXTRACTION W/PHACO Left 09/07/2020   Procedure: CATARACT EXTRACTION PHACO AND INTRAOCULAR LENS PLACEMENT ([place]) LEFT 8.30 01:09.7;  Surgeon: Leandrew Koyanagi, MD;  Location: [facility];  Service: Ophthalmology;  Laterality: Left;  requests early as possible   TONSILLECTOMY      Family History  Problem Relation Age of Onset   Arthritis Mother    Heart disease Father 47   Diabetes Brother    Cancer Sister        ovarian   Stroke Sister    Heart disease Daughter    Rheum arthritis Daughter     Social History   Socioeconomic History   Marital status: Widowed    Spouse name: Not on file   Number of children: 2   Years of education: Not on file   Highest education level: Not on file  Occupational History  Occupation: House wife, farming  Tobacco Use   Smoking status: Never    Passive exposure: Never   Smokeless tobacco: Never  Vaping Use   Vaping Use: Never used  Substance and Sexual Activity   Alcohol use: Yes    Alcohol/week: 0.0 standard drinks of alcohol    Comment: wine   Drug use: No   Sexual activity: Not on file  Other Topics Concern   Not on file  Social History Narrative   Has living will   1 living daughter is  health care POA.     Has DNR already   Request no feeding tube.  Does not want extended ventilator support.   Social Determinants of Health   Financial Resource Strain: Not on file  Food Insecurity: Not on file  Transportation Needs: Not on file  Physical Activity: Not  on file  Stress: Not on file  Social Connections: Not on file  Intimate Partner Violence: Not on file   Review of Systems Appetite is good Weight down slightly---has cut back on eating some Sleeps okay mostly Wears seat belt Teeth are okay--keeps up with dentist Bowels move okay usually. Occasionally skips a day--no Rx needed Occasional back pain--will use heating pad and gets massage every other week Does use aleve daily (tylenol not much help) Some psoriasis and SKs---does see derm    Objective:   Physical Exam Constitutional:      Appearance: Normal appearance.  HENT:     Mouth/Throat:     Comments: No lesions Eyes:     Conjunctiva/sclera: Conjunctivae normal.     Pupils: Pupils are equal, round, and reactive to light.  Cardiovascular:     Rate and Rhythm: Normal rate and regular rhythm.     Pulses: Normal pulses.     Heart sounds: No murmur heard.    No gallop.  Pulmonary:     Effort: Pulmonary effort is normal.     Breath sounds: Normal breath sounds. No wheezing or rales.  Abdominal:     Palpations: Abdomen is soft.     Tenderness: There is no abdominal tenderness.  Musculoskeletal:     Cervical back: Neck supple.     Right lower leg: No edema.     Left lower leg: No edema.  Lymphadenopathy:     Cervical: No cervical adenopathy.  Skin:    Findings: No lesion or rash.  Neurological:     General: No focal deficit present.     Mental Status: She is alert and oriented to person, place, and time.     Comments: Mini-cog okay---clock good, recall 2/3  Psychiatric:        Mood and Affect: Mood normal.        Behavior: Behavior normal.            Assessment & Plan:

## 2022-01-06 NOTE — Assessment & Plan Note (Signed)
Has DNR 

## 2022-01-06 NOTE — Assessment & Plan Note (Signed)
Quiet on nexium '20mg'$  daily

## 2022-01-06 NOTE — Assessment & Plan Note (Signed)
Has pessary

## 2022-01-06 NOTE — Progress Notes (Signed)
Hearing Screening - Comments:: Has hearing aids. Wearing them today.  Vision Screening - Comments:: March 2023

## 2022-01-06 NOTE — Assessment & Plan Note (Signed)
I have personally reviewed the Medicare Annual Wellness questionnaire and have noted 1. The patient's medical and social history 2. Their use of alcohol, tobacco or illicit drugs 3. Their current medications and supplements 4. The patient's functional ability including ADL's, fall risks, home safety risks and hearing or visual             impairment. 5. Diet and physical activities 6. Evidence for depression or mood disorders  The patients weight, height, BMI and visual acuity have been recorded in the chart I have made referrals, counseling and provided education to the patient based review of the above and I have provided the pt with a written personalized care plan for preventive services.  I have provided you with a copy of your personalized plan for preventive services. Please take the time to review along with your updated medication list.  No cancer screening due to age Had flu vaccine Getting updated COVID soon Td at pharmacy Consider shingrix Does some light exercises

## 2022-01-06 NOTE — Assessment & Plan Note (Signed)
BP Readings from Last 3 Encounters:  01/06/22 116/66  10/18/21 (!) 140/70  05/19/21 140/80   Controlled with losartan/HCTZ 50/12.5

## 2022-01-06 NOTE — Assessment & Plan Note (Signed)
No symptoms of relapse Will check sed rate

## 2022-01-06 NOTE — Assessment & Plan Note (Signed)
Does okay with aleve daily No change unless GFR goes down

## 2022-04-28 ENCOUNTER — Other Ambulatory Visit: Payer: Self-pay | Admitting: Internal Medicine

## 2022-05-20 ENCOUNTER — Ambulatory Visit: Payer: Medicare HMO | Admitting: Obstetrics & Gynecology

## 2022-05-20 ENCOUNTER — Encounter: Payer: Self-pay | Admitting: Obstetrics & Gynecology

## 2022-05-20 VITALS — BP 158/80 | Ht 60.0 in | Wt 133.0 lb

## 2022-05-20 DIAGNOSIS — N814 Uterovaginal prolapse, unspecified: Secondary | ICD-10-CM

## 2022-05-20 DIAGNOSIS — Z4689 Encounter for fitting and adjustment of other specified devices: Secondary | ICD-10-CM | POA: Diagnosis not present

## 2022-05-20 NOTE — Progress Notes (Signed)
    GYNECOLOGY PROGRESS NOTE  Subjective:    [name], female    DOB: 07/20/1923, 89 y.o.   MRN: [id number]  HPI  Patient is a 89 y.o. G24P2002 female who presents for pessary maintenance. She initially had the pessary put in (from her memory) to try to help prevent her from having to get up in the night to void. However, even with the pessary in, she gets up twice per night to void. She comes about every 6 months for pessary maintenance.     Review of Systems Pertinent items are noted in HPI.  She lives at [facility].  Objective:   Blood pressure (!) 158/80, height 5' (1.524 m), weight 133 lb (60.3 kg). Body mass index is 25.97 kg/m. Well nourished, well hydrated White female, no apparent distress She is ambulating with a walker and conversing normally.   Genitourinary:             External: Normal external female genitalia.  Normal urethral meatus, normal Bartholin's and Skene's glands.               Vagina with Graves speculum:  Normal vaginal mucosa, no excoriations, moderate atrophy             Cervix: Grossly normal in appearance, no bleeding. Without the pessary, she had a Grade 2 prolapse of cervix. With her in the standing position, she reports that she does not feel discomfort from prolapse.             Uterus: Non-enlarged, mobile, normal contour.  No CMT             I removed the pessary and it had a green discharge and was malodorous. I cleaned it thoroughly.  Assessment:   Grade 2 uterine prolapse She will have a trial without the pessary. I will work her in prn to replace it should she feel better with it in. Plan:   There are no diagnoses linked to this encounter.

## 2022-08-01 DIAGNOSIS — I70209 Unspecified atherosclerosis of native arteries of extremities, unspecified extremity: Secondary | ICD-10-CM | POA: Diagnosis not present

## 2022-08-01 DIAGNOSIS — I7091 Generalized atherosclerosis: Secondary | ICD-10-CM | POA: Diagnosis not present

## 2022-08-01 DIAGNOSIS — B351 Tinea unguium: Secondary | ICD-10-CM | POA: Diagnosis not present

## 2022-08-17 NOTE — Progress Notes (Unsigned)
    GYNECOLOGY PROGRESS NOTE  Subjective:    [name], female    DOB: 15-Jun-1923, 89 y.o.   MRN: [id number]  HPI  Patient is a 89 y.o. G16P2002 female who presents for pessary placement. She has a Grade 2 uterine prolapse. She initially had the pessary put in (from her memory) to try to help prevent her from having to get up in the night to void. However, even with the pessary in, she gets up twice per night to void. She comes about every 6 months for pessary maintenance. She had Dr. Marice Potter take pessary out at her previous visit to see how she felt without it for awhile. She is here today to have her pessary put back in.   {Common ambulatory SmartLinks:19316}  Review of Systems {ros; complete:30496}   Objective:   Blood pressure (!) 207/62, pulse 79, resp. rate 16, height 5' (1.524 m), weight 131 lb 4.8 oz (59.6 kg). Body mass index is 25.64 kg/m. General appearance: {general exam:16600} Abdomen: {abdominal exam:16834} Pelvic: {pelvic exam:16852::"cervix normal in appearance","external genitalia normal","no adnexal masses or tenderness","no cervical motion tenderness","rectovaginal septum normal","uterus normal size, shape, and consistency","vagina normal without discharge"} Extremities: {extremity exam:5109} Neurologic: {neuro exam:17854}   Assessment:   1. Pessary maintenance   2. Uterine prolapse   3. Urinary retention with incomplete bladder emptying      Plan:   There are no diagnoses linked to this encounter.    Hildred Laser, MD [facility]

## 2022-08-18 ENCOUNTER — Encounter: Payer: Self-pay | Admitting: Nurse Practitioner

## 2022-08-18 ENCOUNTER — Ambulatory Visit: Payer: Medicare HMO | Admitting: Nurse Practitioner

## 2022-08-18 VITALS — BP 126/82 | HR 72 | Temp 97.6°F | Ht 60.0 in

## 2022-08-18 DIAGNOSIS — H9113 Presbycusis, bilateral: Secondary | ICD-10-CM

## 2022-08-18 NOTE — Progress Notes (Signed)
Careteam: Patient Care Team: Karie Schwalbe, MD as PCP - General  Advanced Directive information Does Patient Have a Medical Advance Directive?: Yes, Type of Advance Directive: Healthcare Power of [place];Living will, Does patient want to make changes to medical advance directive?: No - Patient declined  Allergies  Allergen Reactions   Alendronate Sodium     REACTION: GI    Chief Complaint  Patient presents with   Acute Visit    Decreased Hearing. No Pain.      HPI: Patient is a 89 y.o. female seen in today at the [facility] to have ears looked at. Going to hearing aide specialist next week and wants to make sure no wax is in her ears.  She has had decrease in her hearing recently.  No nasal congestion, cough, or runny nose.   Review of Systems:  Review of Systems  Constitutional:  Negative for chills and fever.  HENT:  Positive for hearing loss. Negative for congestion and sore throat.     Past Medical History:  Diagnosis Date   Diverticulosis    GERD (gastroesophageal reflux disease)    Hyperlipidemia    Hypertension    Osteoporosis    Polymyalgia rheumatica ([facility])    Wears dentures    partial upper and lower   Wears hearing aid in both ears    Past Surgical History:  Procedure Laterality Date   ANKLE FRACTURE SURGERY  07/12/2005   CATARACT EXTRACTION  2012   right   CATARACT EXTRACTION W/PHACO Left 09/07/2020   Procedure: CATARACT EXTRACTION PHACO AND INTRAOCULAR LENS PLACEMENT ([facility]) LEFT 8.30 01:09.7;  Surgeon: Lockie Mola, MD;  Location: [facility];  Service: Ophthalmology;  Laterality: Left;  requests early as possible   TONSILLECTOMY     Social History:   reports that she has never smoked. She has never been exposed to tobacco smoke. She has never used smokeless tobacco. She reports current alcohol use. She reports that she does not use drugs.  Family History  Problem Relation Age of Onset   Arthritis Mother    Heart  disease Father 51   Diabetes Brother    Cancer Sister        ovarian   Stroke Sister    Heart disease Daughter    Rheum arthritis Daughter     Medications: Patient's Medications  New Prescriptions   No medications on file  Previous Medications   ESOMEPRAZOLE (NEXIUM) 20 MG CAPSULE    Take 20 mg by mouth daily at 12 noon.   GLUCOSE BLOOD (TRUE METRIX BLOOD GLUCOSE TEST) TEST STRIP    Use to check blood sugar once daily   LOSARTAN-HYDROCHLOROTHIAZIDE (HYZAAR) 50-12.5 MG TABLET    TAKE 1 TABLET EVERY DAY   MELATONIN 5 MG TABS    Take 5 mg by mouth at bedtime as needed.   MULTIPLE VITAMIN (MULTIVITAMIN) CAPSULE    Take 1 capsule by mouth daily.   NAPROXEN SODIUM 220 MG CAPS    Take 1 capsule by mouth 2 (two) times daily as needed.   OVER THE COUNTER MEDICATION    Curcumin 2K Take 1 Daily   TRUEPLUS LANCETS 33G MISC    Use to obtain blood sugar sample once daily   UNABLE TO FIND    Med Name: CBD Oil  Modified Medications   No medications on file  Discontinued Medications   No medications on file    Physical Exam:  Vitals:   08/18/22 1023  BP:  126/82  Pulse: 72  Temp: 97.6 F (36.4 C)  SpO2: 97%  Height: 5' (1.524 m)   Body mass index is 25.97 kg/m. Wt Readings from Last 3 Encounters:  05/20/22 133 lb (60.3 kg)  01/06/22 142 lb (64.4 kg)  10/18/21 143 lb (64.9 kg)    Physical Exam Constitutional:      Appearance: Normal appearance.  HENT:     Right Ear: Tympanic membrane, ear canal and external ear normal.     Left Ear: Tympanic membrane, ear canal and external ear normal.  Pulmonary:     Effort: Pulmonary effort is normal.  Neurological:     Mental Status: She is alert. Mental status is at baseline.  Psychiatric:        Mood and Affect: Mood normal.    Labs reviewed: Basic Metabolic Panel: Recent Labs    01/06/22 1225  NA 131*  K 4.3  CL 97  CO2 28  GLUCOSE 92  BUN 14  CREATININE 0.82  CALCIUM 9.6   Liver Function Tests: Recent Labs     01/06/22 1225  AST 18  ALT 9  ALKPHOS 47  BILITOT 0.4  PROT 6.4  ALBUMIN 4.1   No results for input(s): "LIPASE", "AMYLASE" in the last 8760 hours. No results for input(s): "AMMONIA" in the last 8760 hours. CBC: Recent Labs    01/06/22 1225  WBC 5.1  HGB 12.6  HCT 37.3  MCV 92.6  PLT 208.0   Lipid Panel: No results for input(s): "CHOL", "HDL", "LDLCALC", "TRIG", "CHOLHDL", "LDLDIRECT" in the last 8760 hours. TSH: No results for input(s): "TSH" in the last 8760 hours. A1C: No results found for: "HGBA1C"   Assessment/Plan 1. Presbycusis of both ears Ears are clear bilaterally, plans to follow up with audiologist next week for ongoing assessment of hearing aides.    Janene Harvey. Biagio Borg  [facility] [phone]

## 2022-08-19 ENCOUNTER — Ambulatory Visit: Payer: Medicare HMO | Admitting: Obstetrics and Gynecology

## 2022-08-19 ENCOUNTER — Encounter: Payer: Self-pay | Admitting: Obstetrics and Gynecology

## 2022-08-19 VITALS — BP 207/62 | HR 79 | Resp 16 | Ht 60.0 in | Wt 131.3 lb

## 2022-08-19 DIAGNOSIS — Z4689 Encounter for fitting and adjustment of other specified devices: Secondary | ICD-10-CM | POA: Diagnosis not present

## 2022-08-19 DIAGNOSIS — R339 Retention of urine, unspecified: Secondary | ICD-10-CM

## 2022-08-19 DIAGNOSIS — N814 Uterovaginal prolapse, unspecified: Secondary | ICD-10-CM | POA: Diagnosis not present

## 2022-08-19 DIAGNOSIS — R399 Unspecified symptoms and signs involving the genitourinary system: Secondary | ICD-10-CM

## 2022-08-19 NOTE — Progress Notes (Unsigned)
Patient reports urinary retention. In and out cath performed using aseptic technique per orders from Dr. Valentino Saxon. Drained 260 ml amber cloudy urine with strong odor upon catheter placement. Patient tolerated procedure well.

## 2022-08-23 DIAGNOSIS — H43813 Vitreous degeneration, bilateral: Secondary | ICD-10-CM | POA: Diagnosis not present

## 2022-08-23 DIAGNOSIS — Z0100 Encounter for examination of eyes and vision without abnormal findings: Secondary | ICD-10-CM | POA: Diagnosis not present

## 2022-08-23 DIAGNOSIS — B399 Histoplasmosis, unspecified: Secondary | ICD-10-CM | POA: Diagnosis not present

## 2022-08-23 DIAGNOSIS — H04123 Dry eye syndrome of bilateral lacrimal glands: Secondary | ICD-10-CM | POA: Diagnosis not present

## 2022-08-25 ENCOUNTER — Telehealth (INDEPENDENT_AMBULATORY_CARE_PROVIDER_SITE_OTHER): Payer: Medicare HMO

## 2022-08-25 DIAGNOSIS — R399 Unspecified symptoms and signs involving the genitourinary system: Secondary | ICD-10-CM

## 2022-08-25 NOTE — Telephone Encounter (Signed)
Patient's Daughter calling to inquire about urine results from her 08/19/22 visit.

## 2022-08-26 LAB — POCT URINALYSIS DIPSTICK
Appearance: ABNORMAL
Bilirubin, UA: NEGATIVE
Blood, UA: NEGATIVE
Glucose, UA: NEGATIVE
Ketones, UA: NEGATIVE
Nitrite, UA: POSITIVE
Odor: ABNORMAL
Protein, UA: NEGATIVE
Spec Grav, UA: 1.02 (ref 1.010–1.025)
Urobilinogen, UA: 0.2 E.U./dL
pH, UA: 5 (ref 5.0–8.0)

## 2022-08-26 MED ORDER — SULFAMETHOXAZOLE-TRIMETHOPRIM 800-160 MG PO TABS: 1.0000 | ORAL_TABLET | Freq: Two times a day (BID) | ORAL | 0 refills | Status: AC

## 2022-08-26 NOTE — Telephone Encounter (Signed)
Mollie (patient daughter) aware.

## 2022-08-26 NOTE — Telephone Encounter (Signed)
Patient aware of urine results, rx sent, culture sent.

## 2022-08-26 NOTE — Telephone Encounter (Signed)
Patient daughter aware. Advised to have patient follow up with [place] if she still has symptoms after completing meds.

## 2022-08-26 NOTE — Telephone Encounter (Addendum)
Urine specimen received.  In office urinalysis results:   Culture & Urinalysis sent to [facility]. Rx sent for Bactrim.

## 2022-08-26 NOTE — Telephone Encounter (Signed)
Patient aware.

## 2022-08-29 DIAGNOSIS — R399 Unspecified symptoms and signs involving the genitourinary system: Secondary | ICD-10-CM | POA: Diagnosis not present

## 2022-08-30 LAB — URINALYSIS
Bilirubin, UA: NEGATIVE
Glucose, UA: NEGATIVE
Ketones, UA: NEGATIVE
Nitrite, UA: NEGATIVE
RBC, UA: NEGATIVE
Specific Gravity, UA: 1.013 (ref 1.005–1.030)
Urobilinogen, Ur: 0.2 mg/dL (ref 0.2–1.0)
pH, UA: 7 (ref 5.0–7.5)

## 2022-08-31 LAB — URINE CULTURE

## 2022-10-13 ENCOUNTER — Telehealth: Payer: Self-pay | Admitting: Internal Medicine

## 2022-10-13 NOTE — Telephone Encounter (Signed)
Patient dropped off document Handicap Placard, to be filled out by provider. Patient requested to send it back via Call Patient to pick up within 5-days. Document is located in providers tray at front office.Please advise at Mobile [phone]

## 2022-10-17 NOTE — Telephone Encounter (Signed)
Pt called checking on status of handicap parking placard ppw? Pt states she'd like to pick up ppw this morning around 10am, when she has transportation when she comes. Call back # [phone]

## 2022-10-17 NOTE — Telephone Encounter (Signed)
Spoke to pt. Advised her the form is up front ready for pickup. Placed in yellow folder.

## 2022-11-28 DIAGNOSIS — H16143 Punctate keratitis, bilateral: Secondary | ICD-10-CM | POA: Diagnosis not present

## 2022-11-28 DIAGNOSIS — H6121 Impacted cerumen, right ear: Secondary | ICD-10-CM | POA: Diagnosis not present

## 2022-11-28 DIAGNOSIS — H903 Sensorineural hearing loss, bilateral: Secondary | ICD-10-CM | POA: Diagnosis not present

## 2022-11-28 DIAGNOSIS — H04123 Dry eye syndrome of bilateral lacrimal glands: Secondary | ICD-10-CM | POA: Diagnosis not present

## 2022-12-03 DIAGNOSIS — I7091 Generalized atherosclerosis: Secondary | ICD-10-CM | POA: Diagnosis not present

## 2022-12-03 DIAGNOSIS — B351 Tinea unguium: Secondary | ICD-10-CM | POA: Diagnosis not present

## 2023-01-06 ENCOUNTER — Encounter: Payer: Self-pay | Admitting: Internal Medicine

## 2023-01-06 ENCOUNTER — Ambulatory Visit (INDEPENDENT_AMBULATORY_CARE_PROVIDER_SITE_OTHER): Payer: Medicare HMO | Admitting: Internal Medicine

## 2023-01-06 VITALS — BP 162/74 | HR 67 | Ht <= 58 in | Wt 128.0 lb

## 2023-01-06 DIAGNOSIS — M159 Polyosteoarthritis, unspecified: Secondary | ICD-10-CM | POA: Diagnosis not present

## 2023-01-06 DIAGNOSIS — K219 Gastro-esophageal reflux disease without esophagitis: Secondary | ICD-10-CM

## 2023-01-06 DIAGNOSIS — N814 Uterovaginal prolapse, unspecified: Secondary | ICD-10-CM | POA: Diagnosis not present

## 2023-01-06 DIAGNOSIS — M353 Polymyalgia rheumatica: Secondary | ICD-10-CM | POA: Diagnosis not present

## 2023-01-06 DIAGNOSIS — Z0000 Encounter for general adult medical examination without abnormal findings: Secondary | ICD-10-CM | POA: Insufficient documentation

## 2023-01-06 DIAGNOSIS — I10 Essential (primary) hypertension: Secondary | ICD-10-CM

## 2023-01-06 LAB — COMPREHENSIVE METABOLIC PANEL
ALT: 11 U/L (ref 0–35)
AST: 21 U/L (ref 0–37)
Albumin: 4 g/dL (ref 3.5–5.2)
Alkaline Phosphatase: 47 U/L (ref 39–117)
BUN: 14 mg/dL (ref 6–23)
CO2: 29 meq/L (ref 19–32)
Calcium: 9.9 mg/dL (ref 8.4–10.5)
Chloride: 98 meq/L (ref 96–112)
Creatinine, Ser: 0.85 mg/dL (ref 0.40–1.20)
GFR: 57.61 mL/min — ABNORMAL LOW (ref >60.00)
Glucose, Bld: 103 mg/dL — ABNORMAL HIGH (ref 70–99)
Potassium: 4.5 meq/L (ref 3.5–5.1)
Sodium: 134 meq/L — ABNORMAL LOW (ref 135–145)
Total Bilirubin: 0.6 mg/dL (ref 0.2–1.2)
Total Protein: 6.2 g/dL (ref 6.0–8.3)

## 2023-01-06 LAB — CBC
HCT: 36.1 % (ref 36.0–46.0)
Hemoglobin: 12.1 g/dL (ref 12.0–15.0)
MCHC: 33.4 g/dL (ref 30.0–36.0)
MCV: 94.1 fL (ref 78.0–100.0)
Platelets: 177 10*3/uL (ref 150.0–400.0)
RBC: 3.83 Mil/uL — ABNORMAL LOW (ref 3.87–5.11)
RDW: 13 % (ref 11.5–15.5)
WBC: 4.3 10*3/uL (ref 4.0–10.5)

## 2023-01-06 LAB — SEDIMENTATION RATE: Sed Rate: 4 mm/h (ref 0–30)

## 2023-01-06 NOTE — Assessment & Plan Note (Signed)
I have personally reviewed the Medicare Annual Wellness questionnaire and have noted 1. The patient's medical and social history 2. Their use of alcohol, tobacco or illicit drugs 3. Their current medications and supplements 4. The patient's functional ability including ADL's, fall risks, home safety risks and hearing or visual             impairment. 5. Diet and physical activities 6. Evidence for depression or mood disorders  The patients weight, height, BMI and visual acuity have been recorded in the chart I have made referrals, counseling and provided education to the patient based review of the above and I have provided the pt with a written personalized care plan for preventive services.  I have provided you with a copy of your personalized plan for preventive services. Please take the time to review along with your updated medication list.  No cancer screening Not really able to exercise much Had flu vaccine Needs COVID update and RSV at the pharmacy Td if any injury

## 2023-01-06 NOTE — Assessment & Plan Note (Signed)
Does well with the pessary

## 2023-01-06 NOTE — Progress Notes (Signed)
Hearing Screening - Comments:: Has hearing aids. Wearing them today. Vision Screening - Comments:: October 2024

## 2023-01-06 NOTE — Assessment & Plan Note (Signed)
No recent issues

## 2023-01-06 NOTE — Assessment & Plan Note (Signed)
Controlled with nexium 20mg  daily

## 2023-01-06 NOTE — Assessment & Plan Note (Signed)
Seems to be quiet Will recheck sed rate

## 2023-01-06 NOTE — Assessment & Plan Note (Signed)
BP Readings from Last 3 Encounters:  01/06/23 (!) 162/74  08/19/22 (!) 207/62  08/18/22 126/82   Reasonable control for age---will avoid increases to be sure no postural hypotension Losartan/hydrochlorothiazide 50/12.5 Will check labs

## 2023-01-06 NOTE — Progress Notes (Signed)
Subjective:    [name], female    DOB: Oct 28, 1923, 89 y.o.   MRN: [id number]  HPI Here with friend Jola Babinski for Medicare wellness visit and follow up of chronic health conditions Reviewed advanced directives Reviewed other doctors--Dr Brasington--ophthal, Dr Dove/Cherry--gyn, Ms Janace Litten, Dr Benedetto Goad, Dr Gregary Signs No hospitalizations or surgery in the past year Vision is not great--but stable Ongoing hearing problems--despite hearing aides Occasional glass of wine No tobacco Not really exercising--gets massages that help her No falls Did get depressed after losing car keys---nothing persistent. Not anhedonic No sig memory problems  No longer driving--except on [employer] with 3 wheeled rollator Aide does her shopping--has help 2 hours per week Heavy housework done with monthly help She does her instrumental ADLs still Wears pads for urine if going out Still has pessary  No chest pain or SOB Occasional temple pain No palpitations No dizziness or syncope  No myalgias of late Uses hemp drops  Current Outpatient Medications on File Prior to Visit  Medication Sig Dispense Refill   esomeprazole (NEXIUM) 20 MG capsule Take 20 mg by mouth daily at 12 noon.     glucose blood (TRUE METRIX BLOOD GLUCOSE TEST) test strip Use to check blood sugar once daily 100 each 12   losartan-hydrochlorothiazide (HYZAAR) 50-12.5 MG tablet TAKE 1 TABLET EVERY DAY 90 tablet 3   melatonin 5 MG TABS Take 5 mg by mouth at bedtime as needed.     Multiple Vitamin (MULTIVITAMIN) capsule Take 1 capsule by mouth daily.     Naproxen Sodium 220 MG CAPS Take 1 capsule by mouth 2 (two) times daily as needed.     OVER THE COUNTER MEDICATION Curcumin 2K Take 1 Daily     prednisoLONE acetate (PRED FORTE) 1 % ophthalmic suspension Place 1 drop into both eyes at bedtime.     TRUEplus Lancets 33G MISC Use to obtain blood sugar sample once daily 100 each 3    UNABLE TO FIND Med Name: CBD Oil     No current facility-administered medications on file prior to visit.    Allergies  Allergen Reactions   Alendronate Sodium     REACTION: GI    Past Medical History:  Diagnosis Date   Diverticulosis    GERD (gastroesophageal reflux disease)    Hyperlipidemia    Hypertension    Osteoporosis    Polymyalgia rheumatica ([facility])    Wears dentures    partial upper and lower   Wears hearing aid in both ears     Past Surgical History:  Procedure Laterality Date   ANKLE FRACTURE SURGERY  07/12/2005   CATARACT EXTRACTION  2012   right   CATARACT EXTRACTION W/PHACO Left 09/07/2020   Procedure: CATARACT EXTRACTION PHACO AND INTRAOCULAR LENS PLACEMENT ([facility]) LEFT 8.30 01:09.7;  Surgeon: Lockie Mola, MD;  Location: [facility];  Service: Ophthalmology;  Laterality: Left;  requests early as possible   TONSILLECTOMY      Family History  Problem Relation Age of Onset   Arthritis Mother    Heart disease Father 9   Diabetes Brother    Cancer Sister        ovarian   Stroke Sister    Heart disease Daughter    Rheum arthritis Daughter     Social History   Socioeconomic History   Marital status: Widowed    Spouse name: Not on file   Number of children: 2   Years of education: Not on file  Highest education level: Not on file  Occupational History   Occupation: House wife, farming  Tobacco Use   Smoking status: Never    Passive exposure: Never   Smokeless tobacco: Never  Vaping Use   Vaping status: Never Used  Substance and Sexual Activity   Alcohol use: Yes    Alcohol/week: 0.0 standard drinks of alcohol    Comment: wine   Drug use: No   Sexual activity: Not on file  Other Topics Concern   Not on file  Social History Narrative   Has living will   1 living daughter is  health care POA.     Has DNR already   Request no feeding tube.  Does not want extended ventilator support.   Social Determinants of Health    Financial Resource Strain: Not on file  Food Insecurity: Not on file  Transportation Needs: Not on file  Physical Activity: Not on file  Stress: Not on file  Social Connections: Not on file  Intimate Partner Violence: Not on file   Review of Systems Appetite is fair--not like in the past Weight is stable Sleeps okay Wears seat belt Teeth are okay---partials Lots of moles--but no suspicious lesions No heartburn--takes the nexium. No dysphagia Bowels move okay---fiber cereal No sig back or joint pains    Objective:   Physical Exam Constitutional:      Appearance: Normal appearance.  HENT:     Mouth/Throat:     Pharynx: No oropharyngeal exudate or posterior oropharyngeal erythema.  Eyes:     Conjunctiva/sclera: Conjunctivae normal.     Pupils: Pupils are equal, round, and reactive to light.  Cardiovascular:     Rate and Rhythm: Normal rate and regular rhythm.     Pulses: Normal pulses.     Heart sounds: No murmur heard.    No gallop.  Pulmonary:     Effort: Pulmonary effort is normal.     Breath sounds: Normal breath sounds. No wheezing or rales.  Abdominal:     Palpations: Abdomen is soft.     Tenderness: There is no abdominal tenderness.  Musculoskeletal:     Cervical back: Neck supple.     Right lower leg: No edema.     Left lower leg: No edema.  Lymphadenopathy:     Cervical: No cervical adenopathy.  Skin:    Findings: No rash.  Neurological:     General: No focal deficit present.     Mental Status: She is alert and oriented to person, place, and time.     Comments: Word naming---5 then froze Recall 3/3  Psychiatric:        Mood and Affect: Mood normal.        Behavior: Behavior normal.            Assessment & Plan:

## 2023-02-15 ENCOUNTER — Other Ambulatory Visit: Payer: Self-pay | Admitting: Internal Medicine

## 2023-06-16 ENCOUNTER — Other Ambulatory Visit: Payer: Self-pay

## 2023-06-16 ENCOUNTER — Emergency Department

## 2023-06-16 ENCOUNTER — Inpatient Hospital Stay
Admission: EM | Admit: 2023-06-16 | Discharge: 2023-06-20 | DRG: 280 | Disposition: A | Discharge status: Skilled Nursing Facility | Attending: Internal Medicine | Admitting: Internal Medicine

## 2023-06-16 DIAGNOSIS — E876 Hypokalemia: Secondary | ICD-10-CM | POA: Diagnosis not present

## 2023-06-16 DIAGNOSIS — S0990XA Unspecified injury of head, initial encounter: Secondary | ICD-10-CM | POA: Diagnosis not present

## 2023-06-16 DIAGNOSIS — I10 Essential (primary) hypertension: Secondary | ICD-10-CM | POA: Diagnosis present

## 2023-06-16 DIAGNOSIS — R9431 Abnormal electrocardiogram [ECG] [EKG]: Secondary | ICD-10-CM | POA: Diagnosis not present

## 2023-06-16 DIAGNOSIS — D72828 Other elevated white blood cell count: Secondary | ICD-10-CM | POA: Diagnosis present

## 2023-06-16 DIAGNOSIS — R918 Other nonspecific abnormal finding of lung field: Secondary | ICD-10-CM | POA: Diagnosis not present

## 2023-06-16 DIAGNOSIS — I358 Other nonrheumatic aortic valve disorders: Secondary | ICD-10-CM | POA: Diagnosis not present

## 2023-06-16 DIAGNOSIS — L8990 Pressure ulcer of unspecified site, unspecified stage: Secondary | ICD-10-CM | POA: Diagnosis not present

## 2023-06-16 DIAGNOSIS — Z043 Encounter for examination and observation following other accident: Secondary | ICD-10-CM | POA: Diagnosis not present

## 2023-06-16 DIAGNOSIS — I214 Non-ST elevation (NSTEMI) myocardial infarction: Secondary | ICD-10-CM | POA: Diagnosis not present

## 2023-06-16 DIAGNOSIS — R7401 Elevation of levels of liver transaminase levels: Secondary | ICD-10-CM | POA: Diagnosis not present

## 2023-06-16 DIAGNOSIS — M4802 Spinal stenosis, cervical region: Secondary | ICD-10-CM | POA: Diagnosis not present

## 2023-06-16 DIAGNOSIS — Z974 Presence of external hearing-aid: Secondary | ICD-10-CM

## 2023-06-16 DIAGNOSIS — Z8249 Family history of ischemic heart disease and other diseases of the circulatory system: Secondary | ICD-10-CM

## 2023-06-16 DIAGNOSIS — Z823 Family history of stroke: Secondary | ICD-10-CM

## 2023-06-16 DIAGNOSIS — K219 Gastro-esophageal reflux disease without esophagitis: Secondary | ICD-10-CM | POA: Diagnosis present

## 2023-06-16 DIAGNOSIS — Z833 Family history of diabetes mellitus: Secondary | ICD-10-CM | POA: Diagnosis not present

## 2023-06-16 DIAGNOSIS — Z66 Do not resuscitate: Secondary | ICD-10-CM | POA: Diagnosis present

## 2023-06-16 DIAGNOSIS — R748 Abnormal levels of other serum enzymes: Secondary | ICD-10-CM

## 2023-06-16 DIAGNOSIS — R278 Other lack of coordination: Secondary | ICD-10-CM | POA: Diagnosis not present

## 2023-06-16 DIAGNOSIS — L89519 Pressure ulcer of right ankle, unspecified stage: Secondary | ICD-10-CM | POA: Diagnosis present

## 2023-06-16 DIAGNOSIS — R2689 Other abnormalities of gait and mobility: Secondary | ICD-10-CM | POA: Diagnosis not present

## 2023-06-16 DIAGNOSIS — E785 Hyperlipidemia, unspecified: Secondary | ICD-10-CM | POA: Diagnosis present

## 2023-06-16 DIAGNOSIS — M6282 Rhabdomyolysis: Secondary | ICD-10-CM

## 2023-06-16 DIAGNOSIS — E872 Acidosis, unspecified: Secondary | ICD-10-CM

## 2023-06-16 DIAGNOSIS — D72829 Elevated white blood cell count, unspecified: Secondary | ICD-10-CM

## 2023-06-16 DIAGNOSIS — Z9181 History of falling: Secondary | ICD-10-CM | POA: Diagnosis not present

## 2023-06-16 DIAGNOSIS — Z888 Allergy status to other drugs, medicaments and biological substances status: Secondary | ICD-10-CM

## 2023-06-16 DIAGNOSIS — L89899 Pressure ulcer of other site, unspecified stage: Secondary | ICD-10-CM | POA: Diagnosis present

## 2023-06-16 DIAGNOSIS — K7200 Acute and subacute hepatic failure without coma: Secondary | ICD-10-CM | POA: Diagnosis not present

## 2023-06-16 DIAGNOSIS — Z8261 Family history of arthritis: Secondary | ICD-10-CM | POA: Diagnosis not present

## 2023-06-16 DIAGNOSIS — W19XXXA Unspecified fall, initial encounter: Secondary | ICD-10-CM

## 2023-06-16 DIAGNOSIS — Z79899 Other long term (current) drug therapy: Secondary | ICD-10-CM | POA: Diagnosis not present

## 2023-06-16 DIAGNOSIS — R54 Age-related physical debility: Secondary | ICD-10-CM | POA: Diagnosis present

## 2023-06-16 DIAGNOSIS — M810 Age-related osteoporosis without current pathological fracture: Secondary | ICD-10-CM | POA: Diagnosis present

## 2023-06-16 DIAGNOSIS — R32 Unspecified urinary incontinence: Secondary | ICD-10-CM | POA: Diagnosis present

## 2023-06-16 DIAGNOSIS — I4510 Unspecified right bundle-branch block: Secondary | ICD-10-CM | POA: Diagnosis present

## 2023-06-16 DIAGNOSIS — M47812 Spondylosis without myelopathy or radiculopathy, cervical region: Secondary | ICD-10-CM | POA: Diagnosis not present

## 2023-06-16 DIAGNOSIS — E042 Nontoxic multinodular goiter: Secondary | ICD-10-CM | POA: Diagnosis not present

## 2023-06-16 DIAGNOSIS — Z741 Need for assistance with personal care: Secondary | ICD-10-CM | POA: Diagnosis not present

## 2023-06-16 DIAGNOSIS — Y93H2 Activity, gardening and landscaping: Secondary | ICD-10-CM | POA: Diagnosis not present

## 2023-06-16 DIAGNOSIS — S199XXA Unspecified injury of neck, initial encounter: Secondary | ICD-10-CM | POA: Diagnosis not present

## 2023-06-16 DIAGNOSIS — Y92096 Garden or yard of other non-institutional residence as the place of occurrence of the external cause: Secondary | ICD-10-CM

## 2023-06-16 DIAGNOSIS — M160 Bilateral primary osteoarthritis of hip: Secondary | ICD-10-CM | POA: Diagnosis not present

## 2023-06-16 DIAGNOSIS — M6281 Muscle weakness (generalized): Secondary | ICD-10-CM | POA: Diagnosis not present

## 2023-06-16 DIAGNOSIS — E041 Nontoxic single thyroid nodule: Secondary | ICD-10-CM | POA: Diagnosis not present

## 2023-06-16 LAB — CBC WITH DIFFERENTIAL/PLATELET
Abs Immature Granulocytes: 0.06 10*3/uL (ref 0.00–0.07)
Basophils Absolute: 0 10*3/uL (ref 0.0–0.1)
Basophils Relative: 0 %
Eosinophils Absolute: 0.1 10*3/uL (ref 0.0–0.5)
Eosinophils Relative: 0 %
HCT: 40 % (ref 36.0–46.0)
Hemoglobin: 14.5 g/dL (ref 12.0–15.0)
Immature Granulocytes: 0 %
Lymphocytes Relative: 3 %
Lymphs Abs: 0.6 10*3/uL — ABNORMAL LOW (ref 0.7–4.0)
MCH: 31.9 pg (ref 26.0–34.0)
MCHC: 36.3 g/dL — ABNORMAL HIGH (ref 30.0–36.0)
MCV: 87.9 fL (ref 80.0–100.0)
Monocytes Absolute: 1.2 10*3/uL — ABNORMAL HIGH (ref 0.1–1.0)
Monocytes Relative: 6 %
Neutro Abs: 16.3 10*3/uL — ABNORMAL HIGH (ref 1.7–7.7)
Neutrophils Relative %: 91 %
Platelets: 192 10*3/uL (ref 150–400)
RBC: 4.55 MIL/uL (ref 3.87–5.11)
RDW: 11.9 % (ref 11.5–15.5)
WBC: 18.2 10*3/uL — ABNORMAL HIGH (ref 4.0–10.5)
nRBC: 0 % (ref 0.0–0.2)

## 2023-06-16 LAB — COMPREHENSIVE METABOLIC PANEL WITH GFR
ALT: 86 U/L — ABNORMAL HIGH (ref 0–44)
AST: 306 U/L — ABNORMAL HIGH (ref 15–41)
Albumin: 3.8 g/dL (ref 3.5–5.0)
Alkaline Phosphatase: 40 U/L (ref 38–126)
Anion gap: 14 (ref 5–15)
BUN: 23 mg/dL (ref 8–23)
CO2: 22 mmol/L (ref 22–32)
Calcium: 9.7 mg/dL (ref 8.9–10.3)
Chloride: 100 mmol/L (ref 98–111)
Creatinine, Ser: 0.89 mg/dL (ref 0.44–1.00)
GFR, Estimated: 59 mL/min — ABNORMAL LOW (ref >60)
Glucose, Bld: 112 mg/dL — ABNORMAL HIGH (ref 70–99)
Potassium: 2.8 mmol/L — ABNORMAL LOW (ref 3.5–5.1)
Sodium: 136 mmol/L (ref 135–145)
Total Bilirubin: 2 mg/dL — ABNORMAL HIGH (ref 0.0–1.2)
Total Protein: 6.4 g/dL — ABNORMAL LOW (ref 6.5–8.1)

## 2023-06-16 LAB — MAGNESIUM: Magnesium: 2 mg/dL (ref 1.7–2.4)

## 2023-06-16 LAB — URINALYSIS, W/ REFLEX TO CULTURE (INFECTION SUSPECTED)
Bilirubin Urine: NEGATIVE
Glucose, UA: NEGATIVE mg/dL
Ketones, ur: 20 mg/dL — AB
Leukocytes,Ua: NEGATIVE
Nitrite: NEGATIVE
Protein, ur: 100 mg/dL — AB
Specific Gravity, Urine: 1.018 (ref 1.005–1.030)
pH: 5 (ref 5.0–8.0)

## 2023-06-16 LAB — TROPONIN I (HIGH SENSITIVITY)
Troponin I (High Sensitivity): 7590 ng/L (ref <18)
Troponin I (High Sensitivity): 8231 ng/L (ref <18)

## 2023-06-16 LAB — BRAIN NATRIURETIC PEPTIDE: B Natriuretic Peptide: 962.9 pg/mL — ABNORMAL HIGH (ref 0.0–100.0)

## 2023-06-16 LAB — RESP PANEL BY RT-PCR (RSV, FLU A&B, COVID)  RVPGX2
Influenza A by PCR: NEGATIVE
Influenza B by PCR: NEGATIVE
Resp Syncytial Virus by PCR: NEGATIVE
SARS Coronavirus 2 by RT PCR: NEGATIVE

## 2023-06-16 LAB — PROTIME-INR
INR: 1.3 — ABNORMAL HIGH (ref 0.8–1.2)
Prothrombin Time: 16.3 s — ABNORMAL HIGH (ref 11.4–15.2)

## 2023-06-16 LAB — LACTIC ACID, PLASMA
Lactic Acid, Venous: 2.6 mmol/L (ref 0.5–1.9)
Lactic Acid, Venous: 1.9 mmol/L (ref 0.5–1.9)

## 2023-06-16 LAB — CK: Total CK: 8918 U/L — ABNORMAL HIGH (ref 38–234)

## 2023-06-16 LAB — APTT: aPTT: 34 s (ref 24–36)

## 2023-06-16 MED ORDER — ACETAMINOPHEN 325 MG PO TABS: 650.0000 mg | ORAL_TABLET | Freq: Four times a day (QID) | ORAL | Status: DC | PRN

## 2023-06-16 MED ORDER — ASPIRIN 325 MG PO TABS
325.0000 mg | ORAL_TABLET | Freq: Once | ORAL | Status: AC
  Administered 2023-06-16: 325 mg via ORAL
  Filled 2023-06-16: qty 1

## 2023-06-16 MED ORDER — SODIUM CHLORIDE 0.9 % IV BOLUS
500.0000 mL | Freq: Once | INTRAVENOUS | Status: AC
  Administered 2023-06-16: 500 mL via INTRAVENOUS

## 2023-06-16 MED ORDER — ONDANSETRON HCL 4 MG/2ML IJ SOLN: 4.0000 mg | Freq: Four times a day (QID) | INTRAMUSCULAR | Status: DC | PRN

## 2023-06-16 MED ORDER — SODIUM CHLORIDE 0.9% FLUSH
3.0000 mL | Freq: Two times a day (BID) | INTRAVENOUS | Status: DC
  Administered 2023-06-17 – 2023-06-20 (×7): 3 mL via INTRAVENOUS

## 2023-06-16 MED ORDER — LACTATED RINGERS IV BOLUS
500.0000 mL | Freq: Once | INTRAVENOUS | Status: AC
  Administered 2023-06-16: 500 mL via INTRAVENOUS

## 2023-06-16 MED ORDER — SODIUM CHLORIDE 0.9 % IV SOLN: 250.0000 mL | INTRAVENOUS | Status: AC | PRN

## 2023-06-16 MED ORDER — PANTOPRAZOLE SODIUM 40 MG PO TBEC
40.0000 mg | DELAYED_RELEASE_TABLET | Freq: Every day | ORAL | Status: DC
  Administered 2023-06-16 – 2023-06-20 (×5): 40 mg via ORAL
  Filled 2023-06-16 (×5): qty 1

## 2023-06-16 MED ORDER — HEPARIN (PORCINE) 25000 UT/250ML-% IV SOLN
800.0000 [IU]/h | INTRAVENOUS | Status: DC
  Administered 2023-06-16: 650 [IU]/h via INTRAVENOUS
  Filled 2023-06-16: qty 250

## 2023-06-16 MED ORDER — POTASSIUM CHLORIDE 2 MEQ/ML IV SOLN
INTRAVENOUS | Status: AC
  Filled 2023-06-16: qty 1000

## 2023-06-16 MED ORDER — POTASSIUM CHLORIDE 20 MEQ PO PACK
40.0000 meq | PACK | Freq: Two times a day (BID) | ORAL | Status: AC
  Administered 2023-06-16 – 2023-06-17 (×2): 40 meq via ORAL
  Filled 2023-06-16 (×2): qty 2

## 2023-06-16 MED ORDER — POLYETHYLENE GLYCOL 3350 17 G PO PACK: 17.0000 g | PACK | Freq: Every day | ORAL | Status: DC | PRN

## 2023-06-16 MED ORDER — SODIUM CHLORIDE 0.9% FLUSH: 3.0000 mL | INTRAVENOUS | Status: DC | PRN

## 2023-06-16 MED ORDER — PROSIGHT PO TABS
1.0000 | ORAL_TABLET | Freq: Every day | ORAL | Status: DC
  Administered 2023-06-18 – 2023-06-20 (×3): 1 via ORAL
  Filled 2023-06-16 (×5): qty 1

## 2023-06-16 MED ORDER — ACETAMINOPHEN 650 MG RE SUPP: 650.0000 mg | Freq: Four times a day (QID) | RECTAL | Status: DC | PRN

## 2023-06-16 MED ORDER — HEPARIN BOLUS VIA INFUSION
3300.0000 [IU] | Freq: Once | INTRAVENOUS | Status: AC
  Administered 2023-06-16: 3300 [IU] via INTRAVENOUS
  Filled 2023-06-16: qty 3300

## 2023-06-16 MED ORDER — ONDANSETRON HCL 4 MG PO TABS: 4.0000 mg | ORAL_TABLET | Freq: Four times a day (QID) | ORAL | Status: DC | PRN

## 2023-06-16 MED ORDER — MELATONIN 5 MG PO TABS
5.0000 mg | ORAL_TABLET | Freq: Every evening | ORAL | Status: DC | PRN
  Administered 2023-06-18 – 2023-06-19 (×2): 5 mg via ORAL
  Filled 2023-06-16 (×2): qty 1

## 2023-06-16 MED ORDER — ASPIRIN 81 MG PO TBEC
81.0000 mg | DELAYED_RELEASE_TABLET | Freq: Every day | ORAL | Status: DC
  Administered 2023-06-17 – 2023-06-20 (×4): 81 mg via ORAL
  Filled 2023-06-16 (×4): qty 1

## 2023-06-16 NOTE — Assessment & Plan Note (Addendum)
 Resolved Likely due to hypoperfusion.  UA not consistent with UTI Preliminary blood cultures negative -Holding off on antibiotics

## 2023-06-16 NOTE — ED Notes (Signed)
 Lab confirms they added troponin for patient

## 2023-06-16 NOTE — Assessment & Plan Note (Addendum)
 Likely reactive with NSTEMI, seems slowly improving -Continue to monitor -Holding antibiotics

## 2023-06-16 NOTE — ED Provider Notes (Signed)
 Care of this patient assumed from prior physician at 1500 pending completion of workup, likely admission. Please see prior physician note for further details.  Briefly this is a 89 year old female who presented after being found down in her garden after possible fall, suspected that patient was laying on the ground for several hours.  Labs resulted with leukocytosis WBC of 18.2, hypokalemia with K of 2.8.  Stable creatinine at 0.89.  Elevated lactate at 2.6.  Elevated troponin as below.  CK significantly elevated at 8900 consistent with rhabdomyolysis.  Chest x-Keavon Sensing and hip x-Higinio Grow without acute findings.  CT head without acute bleed.  CT C-spine without acute fracture.  Clinical Course as of 06/16/23 1735  Fri Jun 16, 2023  1504 Labs reviewed, thus far CBC is resulted shows leukocytosis white count 18,000 suspicous for infection, inflammation, stress reaction, etc.  [MQ]  1553 Troponin I (High Sensitivity)(!!): 7,590 Notified by RN of critical troponin.  Patient denied chest pain on presentation, but did have some ST changes on her EKG.  Will plan for repeat EKG, likely discussion with cardiology. [NR]  1646 Delay in obtaining repeat EKG as patient was in radiology.  Repeat EKG overall remained stable with ST's in the high lateral leads without clear areas of reciprocal depression.  The patient is somewhat confused on my evaluation at bedside, but I put spoke with patient's daughter, Kirt Boys, over the phone who helps make decisions.  Patient has a DNR with [facility] at bedside.  She does not think that the patient would want a heart catheterization or other invasive procedure.  Patient continues to deny any chest pain or shortness of breath.  Discussed findings consistent with rhabdomyolysis and recommendation for admission.  Patient and family are comfortable with this.  Will order additional IV fluids, start IV heparin, and reach out to hospitalist team. [NR]  1701 Discussed with hospitalist team.  They  did request cardiology consultation, but will plan to wait for anticipated admission.  Dr. Shirlee Latch with [facility] paged. [NR]  1733 Reviewed with Dr. Shirlee Latch who agreed with plan for admission and anticoagulation. He would not recommend heart catheterization for this patient.  Continue plan for hospitalist admission. [NR]    Clinical Course User Index [MQ] Sharyn Creamer, MD [NR] Trinna Post, MD    CRITICAL CARE Performed by: Trinna Post   Total critical care time: 32 minutes  Critical care time was exclusive of separately billable procedures and treating other patients.  Critical care was necessary to treat or prevent imminent or life-threatening deterioration.  Critical care was time spent personally by me on the following activities: development of treatment plan with patient and/or surrogate as well as nursing, discussions with consultants, evaluation of patient's response to treatment, examination of patient, obtaining history from patient or surrogate, ordering and performing treatments and interventions, ordering and review of laboratory studies, ordering and review of radiographic studies, pulse oximetry and re-evaluation of patient's condition.     Trinna Post, MD 06/16/23 [phone]

## 2023-06-16 NOTE — Assessment & Plan Note (Addendum)
 Likely due to shock liver with NSTEMI and hypoperfusion. Started slowly improving. - Continue to monitor - Holding to check acute hepatitis panel but if continue to trend up we can check.

## 2023-06-16 NOTE — ED Notes (Signed)
(  2) lab techs attempted to get Blood cultures but Pt severely dehydrated and unable. Will grab a nurse when able. EDP notified.

## 2023-06-16 NOTE — Assessment & Plan Note (Addendum)
 Denies any chest pain but initial troponin at 7500, EKG with ST changes in anteroseptal leads.  No recent EKG to compare with. No prior cardiac history.  Family only wants medical management.  Troponin peaked at 8231 and now started trending down.  Echocardiogram with normal EF, grade 1 diastolic dysfunction and no regional wall motion abnormalities Cardiology is on board-just medical management -Heparin infusion-continue for total of 48 hours -Lipid panel normal-A1c pending -Continue to monitor

## 2023-06-16 NOTE — Assessment & Plan Note (Addendum)
 Likely due to lying down for few hours.  CK at [phone] -Continue IV fluid for another day - Monitor CK

## 2023-06-16 NOTE — Assessment & Plan Note (Addendum)
 Resolved with repletion.  Magnesium was normal -Continue to monitor

## 2023-06-16 NOTE — Progress Notes (Signed)
 PHARMACY - ANTICOAGULATION CONSULT NOTE  Pharmacy Consult for Heparin Infusion Indication: chest pain/ACS  Allergies  Allergen Reactions   Alendronate Sodium     REACTION: GI    Patient Measurements: Height: 4' 9.99" (147.3 cm) Weight: 65.1 kg (143 lb 8.3 oz) IBW/kg (Calculated) : 40.88 HEPARIN DW (KG): 55.3  Vital Signs: Temp: 97.8 F (36.6 C) (04/04 1420) Temp Source: Oral (04/04 1420) BP: 138/64 (04/04 1430) Pulse Rate: 82 (04/04 1430)  Labs: Recent Labs    06/16/23 1447  HGB 14.5  HCT 40.0  PLT 192  LABPROT 16.3*  INR 1.3*  CREATININE 0.89  CKTOTAL 8,918*  TROPONINIHS 7,590*    Estimated Creatinine Clearance: 28.9 mL/min (by C-G formula based on SCr of 0.89 mg/dL).   Medical History: Past Medical History:  Diagnosis Date   Diverticulosis    GERD (gastroesophageal reflux disease)    Hyperlipidemia    Hypertension    Osteoporosis    Polymyalgia rheumatica (HCC)    Wears dentures    partial upper and lower   Wears hearing aid in both ears     Medications:  Not on anticoagulation at home per chart review  Assessment: Patient is a 89 year old female who presents after a fall. Patient did not report chest pain but EKG showed some ST changes. Troponin was elevated as well at 7590. Pharmacy was consulted to initiate patient on a heparin infusion for ACS/STEMI.   Baseline INR 1.3; aPTT ordered.  No signs/symptoms of bleeding noted in chart. CT head was without acute bleed. Hgb 14.5. PLT 192.  Goal of Therapy:  Heparin level 0.3-0.7 units/ml Monitor platelets by anticoagulation protocol: Yes   Plan:  Give 3300 unit bolus x 1 Start heparin infusion at a rate of 650 units/hr Check heparin level in 8 hours  Monitor CBC daily  Merryl Hacker, PharmD Clinical Pharmacist  06/16/2023,4:56 PM

## 2023-06-16 NOTE — ED Provider Notes (Signed)
 [facility] Provider Note    Event Date/Time   First MD Initiated Contact with Patient 06/16/23 1336     (approximate)   History   Fall    HPI  Ann Odom is a 89 y.o. female who was noted to have fallen today.  Per EMS the patient was last known to be normal talk to by family on the phone around 730 this morning.  Patient reports that she was out in her garden this morning she enjoys gardening.  She fell and fell into the dirt that she was gardening with.  She was not able to get up.  The patient reports that she "could get up if I wanted to" but instead she wanted to just "lay and look at the birds".  The patient's able to fill in some details she reports she was gardening she fell forward and landed in the dirt.  Some of it does not seem quite historically accurate though in the back that she said she could get up but instead laid in the dirt for hours which seems to suggest at least some element of confusion. No chest pain, no shortness of breath. Reports no recent illness.   She denies any pain or injury.  EMS reports they were on scene about 45 minutes trying to convince the patient to come to the ER but ultimately decided that she did not need transport as she did not seem to be demonstrate excellent capacity, and they were able to convince her  Denies any pain or discomfort no hip pain no headache denies she passed out.  Does not feel that she was recently ill.  Strong urine odor and incontinence noted by EMS.      Physical Exam   Triage Vital Signs: ED Triage Vitals  Encounter Vitals Group     BP      Systolic BP Percentile      Diastolic BP Percentile      Pulse      Resp      Temp      Temp src      SpO2      Weight      Height      Head Circumference      Peak Flow      Pain Score      Pain Loc      Pain Education      Exclude from Growth Chart    Vitals:   06/16/23 1420 06/16/23 1430  BP:  138/64  Pulse: 80 82  Resp: 16    Temp: 97.8 F (36.6 C)   SpO2:  100%     Most recent vital signs: Vitals:   06/16/23 1420 06/16/23 1430  BP:  138/64  Pulse: 80 82  Resp: 16   Temp: 97.8 F (36.6 C)   SpO2:  100%     General: Awake, no distress.  She is alert and oriented to living at [facility] her name but not able to tell me the exact as of date or year And patient of note is covered with small bits of dirt over her anterior chest, both knees, and a little bit around her lips.  There is no bleeding does not appear to be dried blood or melena.  She is notably incontinent with wet clothing with urine odor CV:  Good peripheral perfusion.  Normal tones and rate Resp:  Normal effort.  Clear bilateral normal work of breathing Abd:  No distention.  Soft nontender nondistended throughout.  Urinary and slight fecal incontinence noted brown stool Other:  Patient has nonblanching erythema over the right upper thigh and slight skin breakdown and early ulceration approximately dime size over the right lateral upper thigh.  Additionally she has slight erythema around the right foot and ankle.  She demonstrates normal range of motion of both lower extremities without pain or discomfort though and there is no deformity or shortening  Normocephalic atraumatic.  No cervical tenderness along the spine   ED Results / Procedures / Treatments   Labs (all labs ordered are listed, but only abnormal results are displayed) Labs Reviewed  COMPREHENSIVE METABOLIC PANEL WITH GFR - Abnormal; Notable for the following components:      Result Value   Potassium 2.8 (*)    Glucose, Bld 112 (*)    Total Protein 6.4 (*)    AST 306 (*)    ALT 86 (*)    Total Bilirubin 2.0 (*)    GFR, Estimated 59 (*)    All other components within normal limits  CBC WITH DIFFERENTIAL/PLATELET - Abnormal; Notable for the following components:   WBC 18.2 (*)    MCHC 36.3 (*)    Neutro Abs 16.3 (*)    Lymphs Abs 0.6 (*)    Monocytes Absolute 1.2 (*)     All other components within normal limits  PROTIME-INR - Abnormal; Notable for the following components:   Prothrombin Time 16.3 (*)    INR 1.3 (*)    All other components within normal limits  CULTURE, BLOOD (ROUTINE X 2)  CULTURE, BLOOD (ROUTINE X 2)  RESP PANEL BY RT-PCR (RSV, FLU A&B, COVID)  RVPGX2  CK  LACTIC ACID, PLASMA  LACTIC ACID, PLASMA  URINALYSIS, W/ REFLEX TO CULTURE (INFECTION SUSPECTED)  TROPONIN I (HIGH SENSITIVITY)     EKG  EKG inter by me at approximately 3:10 PM heart rate 80 QRS 130 QTc 470 Normal sinus rhythm right bundle branch block.  There is subtle ST segment abnormality including minimal just less than 1 mm of elevation in V5 and V6.  Of note though the patient reports no chest pain.  There is ST segment abnormality though not convincingly of STEMI morphology.  This is somewhat new compared to previous though her old is albeit greater than 10 years.  Suspicion for ACS remains elevated will send initial and 2-hour troponins but at this time without associated chest pain the patient resting comfortably without distress I am somewhat doubtful that this would represent STEMI.  Right bundle branch block is also present with classic morphology antero-septal  Nursing reports EKG delayed due to needing to utilize portable EKG machine   RADIOLOGY  No results found.  Imaging including CT head, chest x-ray, and right hip films are pending at time of signout.  Dr. Rosalia Hammers to follow-up   PROCEDURES:  Critical Care performed: No  Procedures   MEDICATIONS ORDERED IN ED: Medications  lactated ringers bolus 500 mL (500 mLs Intravenous New Bag/Given 06/16/23 1413)     IMPRESSION / MDM / ASSESSMENT AND PLAN / ED COURSE  I reviewed the triage vital signs and the nursing notes.                              Differential diagnosis includes, but is not limited to, possible generalized weakness fall, inability to get up, pressure injury, considerations for dehydration  rhabdomyolysis or proceeding  medical illness causing the fall are strongly considered.  Will workup broadly.  She is currently alert not fully but fairly well-oriented consider the age of 48.  She has not show except extremis at this time is resting comfortably.  She has strong urine odor and incontinence we will check for conditions including urinary infection pneumonia, injuries from fall, etc.  Patient's presentation is most consistent with acute complicated illness / injury requiring diagnostic workup.   The patient is on the cardiac monitor to evaluate for evidence of arrhythmia and/or significant heart rate changes.   Clinical Course as of 06/16/23 1533  Fri Jun 16, 2023  1504 Labs reviewed, thus far CBC is resulted shows leukocytosis white count 18,000 suspicous for infection, inflammation, stress reaction, etc.  [MQ]    Clinical Course User Index [MQ] Sharyn Creamer, MD   ----------------------------------------- 3:29 PM on 06/16/2023 ----------------------------------------- Patient alert conversant.  And she is in no acute distress.  Family including niece at the bedside.  They do report the patient's daughter is currently on a plane from [place]  Patient continues to report no pain or discomfort specifically I asked her if she is having any associated chest pain and she reports none.  She reports no pain.   Discussed with the patient's family at the bedside as well as the patient they are understanding of the plan that she will need admission to the hospital.  She is currently resting comfortably without distress.  Awaiting further workup including imaging studies metabolic panel urinalysis for which In-N-Out catheter is being performed and troponin etc.  FINAL CLINICAL IMPRESSION(S) / ED DIAGNOSES   Final diagnoses:  Pressure injury  Fall, initial encounter  Leukocytosis, unspecified type  Abnormal EKG     Rx / DC Orders   ED Discharge Orders     None        Note:   This document was prepared using Dragon voice recognition software and may include unintentional dictation errors.   Sharyn Creamer, MD 06/16/23 [phone]

## 2023-06-16 NOTE — Assessment & Plan Note (Addendum)
 Patient takes losartan at home.  Currently blood pressure within goal -Restarting low-dose losartan

## 2023-06-16 NOTE — ED Notes (Signed)
 Dr. Rosalia Hammers notified of lactic acid of 2.6

## 2023-06-16 NOTE — Assessment & Plan Note (Signed)
-   Takes Nexium at home -Continue with PPI

## 2023-06-16 NOTE — H&P (Signed)
 History and Physical    Patient: Ann Odom QIO:[id number] DOB: [birth year] DOA: 06/16/2023 DOS: the patient was seen and examined on 06/16/2023 PCP: Karie Schwalbe, MD  Patient coming from: ALF/ILF  Chief Complaint:  Chief Complaint  Patient presents with   Altered Mental Status   HPI: Ann Odom is a 89 y.o. female with medical history significant of hypertension, osteoporosis, GERD and hyperlipidemia was sent to ED from [facility] independent living facility when a neighbor found her on the ground facedown.  Patient has baseline is fully functional, last known well was around 9 AM when [facility] facility talked with her on phone.  She when outside to do some gardening and does not remember anything what happened, appears little confused.  When daughter could not reach her she called the neighbor to check on her and they found her on ground facedown in her lawn.  EMS was called.  Patient denies any chest pain or shortness of breath.  She could not recall anything.  Per niece at bedside she was appearing little confused since yesterday, at baseline she is very functional and no prior diagnosis of dementia.  No recent illnesses.  No recent upper respiratory symptoms.  Denies any urinary symptoms.  No constipation or diarrhea.  No nausea or vomiting.  No pain.  ED course and data reviewed.  On presentation vitals stable, labs pertinent for leukocytosis at 18.2, potassium 2.8, troponin elevated at 7590 and INR of 1.3.  Transaminitis with AST 306, ALT 86, T. bili 2, lactic acid of 2.6. Trauma imaging which includes CT head, cervical spine and hip was negative for any acute bony abnormality.  CT cervical spine with a thyroid nodule of about 1 cm with no follow-up imaging recommended.  CXR with no acute abnormality  EKG reviewed by me with sinus sinus rhythm and ST changes in anteroseptal leads.  No recent EKG to compare with.  Review of Systems: As mentioned in the history of present  illness. All other systems reviewed and are negative. Past Medical History:  Diagnosis Date   Diverticulosis    GERD (gastroesophageal reflux disease)    Hyperlipidemia    Hypertension    Osteoporosis    Polymyalgia rheumatica (HCC)    Wears dentures    partial upper and lower   Wears hearing aid in both ears    Past Surgical History:  Procedure Laterality Date   ANKLE FRACTURE SURGERY  07/12/2005   CATARACT EXTRACTION  2012   right   CATARACT EXTRACTION W/PHACO Left 09/07/2020   Procedure: CATARACT EXTRACTION PHACO AND INTRAOCULAR LENS PLACEMENT (IOC) LEFT 8.30 01:09.7;  Surgeon: Lockie Mola, MD;  Location: [facility];  Service: Ophthalmology;  Laterality: Left;  requests early as possible   TONSILLECTOMY     Social History:  reports that she has never smoked. She has never been exposed to tobacco smoke. She has never used smokeless tobacco. She reports current alcohol use. She reports that she does not use drugs.  Allergies  Allergen Reactions   Alendronate Sodium     REACTION: GI    Family History  Problem Relation Age of Onset   Arthritis Mother    Heart disease Father 39   Diabetes Brother    Cancer Sister        ovarian   Stroke Sister    Heart disease Daughter    Rheum arthritis Daughter     Prior to Admission medications   Medication Sig Start Date End  Date Taking? Authorizing Provider  esomeprazole (NEXIUM) 20 MG capsule Take 20 mg by mouth daily at 12 noon.    [provider]  glucose blood (TRUE METRIX BLOOD GLUCOSE TEST) test strip Use to check blood sugar once daily 02/23/21   Karie Schwalbe, MD  losartan-hydrochlorothiazide [facility]) 50-12.5 MG tablet TAKE 1 TABLET EVERY DAY 02/15/23   Tillman Abide I, MD  melatonin 5 MG TABS Take 5 mg by mouth at bedtime as needed.    [provider]  Multiple Vitamin (MULTIVITAMIN) capsule Take 1 capsule by mouth daily.    [provider]  Naproxen Sodium 220 MG CAPS Take  1 capsule by mouth 2 (two) times daily as needed.    [provider]  OVER THE COUNTER MEDICATION Curcumin 2K Take 1 Daily    [provider]  prednisoLONE acetate (PRED FORTE) 1 % ophthalmic suspension Place 1 drop into both eyes at bedtime. 11/28/22   [provider]  TRUEplus Lancets 33G MISC Use to obtain blood sugar sample once daily 02/23/21   Karie Schwalbe, MD  UNABLE TO FIND Med Name: CBD Oil    [provider]    Physical Exam: Vitals:   06/16/23 1345 06/16/23 1420 06/16/23 1430 06/16/23 1646  BP:   138/64   Pulse:  80 82   Resp:  16    Temp:  97.8 F (36.6 C)    TempSrc:  Oral    SpO2:   100%   Weight: 65.1 kg   65.1 kg  Height:    4' 9.99" (1.473 m)   General: Vital signs reviewed.  Frail elderly lady, in no acute distress and cooperative with exam.  Hard of hearing Head: Normocephalic and atraumatic. Eyes: EOMI, conjunctivae normal,  Neck: Supple, trachea midline, normal ROM,  Cardiovascular: RRR, S1 normal, S2 normal, no murmurs, gallops, or rubs. Pulmonary/Chest: Clear to auscultation bilaterally, no wheezes, rales, or rhonchi. Abdominal: Soft, non-tender, non-distended, BS +,  Extremities: No lower extremity edema bilaterally,  pulses symmetric and intact bilaterally.  Neurological: A&O x3, Strength is normal and symmetric bilaterally, cranial nerve II-XII are grossly intact, no focal motor deficit, sensory intact to light touch bilaterally.  Skin: Erythema involving right lateral leg and shoulder. Psychiatric: Normal mood and affect.    Data Reviewed: Prior data reviewed as mentioned above  Assessment and Plan: * NSTEMI (non-ST elevated myocardial infarction) (HCC) Denies any chest pain but initial troponin at 7500, EKG with ST changes in anteroseptal leads.  No recent EKG to compare with. No prior cardiac history.  Family only wants medical management -Admit to progressive care -Trend troponin -Echocardiogram -Heparin  infusion -Check A1c and lipid panel to complete the workup -Continue to monitor -Cardiology was consulted  Rhabdomyolysis Likely due to lying down for few hours.  CK at 8918 - Giving some IV fluid - Monitor CK   Lactic acidosis Likely due to hypoperfusion.  UA not consistent with UTI -Blood cultures sent in ED and pending. -Holding off on antibiotics - Continue to monitor  Hypokalemia Calcium at 2.8. -Checking magnesium -Replace potassium  Transaminitis Likely due to shock liver with NSTEMI and hypoperfusion. - Continue to monitor - Holding to check acute hepatitis panel but if continue to trend up we can check.  Leukocytosis Likely reactive with NSTEMI -Continue to monitor -Holding antibiotics  Hypertension Patient takes losartan at home.  Currently blood pressure within goal -Holding antihypertensives for concern of recent NSTEMI  GERD (gastroesophageal reflux disease) - Takes  Nexium at home -Continue with PPI  Hyperlipidemia - Checking lipid panel -Not on any statin at home -Hold statin for now due to rhabdomyolysis and transaminitis    Advance Care Planning:   Code Status: Do not attempt resuscitation (DNR) PRE-ARREST INTERVENTIONS DESIRED discussed with niece at bedside  Consults: Cardiology  Family Communication: Discussed with niece at bedside  Severity of Illness: The appropriate patient status for this patient is INPATIENT. Inpatient status is judged to be reasonable and necessary in order to provide the required intensity of service to ensure the patient's safety. The patient's presenting symptoms, physical exam findings, and initial radiographic and laboratory data in the context of their chronic comorbidities is felt to place them at high risk for further clinical deterioration. Furthermore, it is not anticipated that the patient will be medically stable for discharge from the hospital within 2 midnights of admission.   * I certify that at the point  of admission it is my clinical judgment that the patient will require inpatient hospital care spanning beyond 2 midnights from the point of admission due to high intensity of service, high risk for further deterioration and high frequency of surveillance required.*  This record has been created using Conservation officer, historic buildings. Errors have been sought and corrected,but may not always be located. Such creation errors do not reflect on the standard of care.   Author: Arnetha Courser, MD 06/16/2023 5:44 PM  For on call review [email or web address].

## 2023-06-16 NOTE — ED Notes (Signed)
 Central monitoring called and cardiac monitoring initiated

## 2023-06-16 NOTE — ED Notes (Signed)
 Lab @ bedside to draw septic labs

## 2023-06-16 NOTE — Assessment & Plan Note (Addendum)
-   Lipid panel normal -Not on any statin at home -Hold statin for now due to rhabdomyolysis and transaminitis

## 2023-06-16 NOTE — ED Notes (Signed)
 Dr. Rosalia Hammers notified of troponin of 7,590

## 2023-06-16 NOTE — ED Notes (Signed)
 Pt given a bed bath at this time d/t smell

## 2023-06-16 NOTE — ED Triage Notes (Signed)
 S: - Pt arrives via EMS from [facility]  B: - Medical Hx: DM, Cardiac, Dementia  - Pertinent Medications: Unknown  A: - EMS Assessment: AMS, Code Sepsis  - EMS Vitals: HR: 92, Endtidal 20, RR 26, BG: 134, BP:132/70, temp: 97.8 oral, 97% RA - EMS interventions: Is Pt on O2? Is this their baseline? No.  - This RNs Assessment: Airway - Intact Breathing -  97% RA Circulation - Disability - Baseline dementia  R: - What brought Pt into ER today?  Found by neighbor on her deck, Pt had fallen from ground level, hit head reportedly not on blood thiiners Pt DNR, not a reliable narrator d/t, seems confused, unknown when fell, found around 12pm Strong UTI smell, Pt has dirt and redness bilateral knees

## 2023-06-17 ENCOUNTER — Inpatient Hospital Stay (HOSPITAL_COMMUNITY)
Admit: 2023-06-17 | Discharge: 2023-06-17 | Disposition: A | Discharge status: Home / Self Care | Attending: Internal Medicine

## 2023-06-17 DIAGNOSIS — R9431 Abnormal electrocardiogram [ECG] [EKG]: Secondary | ICD-10-CM | POA: Diagnosis not present

## 2023-06-17 DIAGNOSIS — E876 Hypokalemia: Secondary | ICD-10-CM | POA: Diagnosis not present

## 2023-06-17 DIAGNOSIS — E872 Acidosis, unspecified: Secondary | ICD-10-CM | POA: Diagnosis not present

## 2023-06-17 DIAGNOSIS — R748 Abnormal levels of other serum enzymes: Secondary | ICD-10-CM

## 2023-06-17 DIAGNOSIS — M6282 Rhabdomyolysis: Secondary | ICD-10-CM | POA: Diagnosis not present

## 2023-06-17 DIAGNOSIS — I214 Non-ST elevation (NSTEMI) myocardial infarction: Secondary | ICD-10-CM | POA: Diagnosis not present

## 2023-06-17 LAB — HEPARIN LEVEL (UNFRACTIONATED): Heparin Unfractionated: 0.16 [IU]/mL — ABNORMAL LOW (ref 0.30–0.70)

## 2023-06-17 LAB — COMPREHENSIVE METABOLIC PANEL WITH GFR
ALT: 74 U/L — ABNORMAL HIGH (ref 0–44)
AST: 235 U/L — ABNORMAL HIGH (ref 15–41)
Albumin: 3.3 g/dL — ABNORMAL LOW (ref 3.5–5.0)
Alkaline Phosphatase: 39 U/L (ref 38–126)
Anion gap: 13 (ref 5–15)
BUN: 27 mg/dL — ABNORMAL HIGH (ref 8–23)
CO2: 19 mmol/L — ABNORMAL LOW (ref 22–32)
Calcium: 9.5 mg/dL (ref 8.9–10.3)
Chloride: 104 mmol/L (ref 98–111)
Creatinine, Ser: 0.92 mg/dL (ref 0.44–1.00)
GFR, Estimated: 57 mL/min — ABNORMAL LOW (ref >60)
Glucose, Bld: 92 mg/dL (ref 70–99)
Potassium: 3.9 mmol/L (ref 3.5–5.1)
Sodium: 136 mmol/L (ref 135–145)
Total Bilirubin: 1.4 mg/dL — ABNORMAL HIGH (ref 0.0–1.2)
Total Protein: 5.8 g/dL — ABNORMAL LOW (ref 6.5–8.1)

## 2023-06-17 LAB — CBC
HCT: 36.4 % (ref 36.0–46.0)
Hemoglobin: 13.1 g/dL (ref 12.0–15.0)
MCH: 32.3 pg (ref 26.0–34.0)
MCHC: 36 g/dL (ref 30.0–36.0)
MCV: 89.7 fL (ref 80.0–100.0)
Platelets: 174 10*3/uL (ref 150–400)
RBC: 4.06 MIL/uL (ref 3.87–5.11)
RDW: 12.1 % (ref 11.5–15.5)
WBC: 17.1 10*3/uL — ABNORMAL HIGH (ref 4.0–10.5)
nRBC: 0 % (ref 0.0–0.2)

## 2023-06-17 LAB — LIPID PANEL
Cholesterol: 178 mg/dL (ref 0–200)
HDL: 82 mg/dL (ref >40)
LDL Cholesterol: 81 mg/dL (ref 0–99)
Total CHOL/HDL Ratio: 2.2 ratio
Triglycerides: 73 mg/dL (ref <150)
VLDL: 15 mg/dL (ref 0–40)

## 2023-06-17 LAB — CK: Total CK: 5272 U/L — ABNORMAL HIGH (ref 38–234)

## 2023-06-17 LAB — ECHOCARDIOGRAM COMPLETE
AR max vel: 2.51 cm2
AV Peak grad: 8.4 mmHg
Ao pk vel: 1.45 m/s
Area-P 1/2: 4.68 cm2
Height: 57.992 in
S' Lateral: 1.7 cm
Single Plane A4C EF: 69.5 %
Weight: 1971.79 [oz_av]

## 2023-06-17 LAB — TROPONIN I (HIGH SENSITIVITY)
Troponin I (High Sensitivity): 2522 ng/L (ref <18)
Troponin I (High Sensitivity): 2542 ng/L (ref <18)

## 2023-06-17 MED ORDER — LACTATED RINGERS IV SOLN: INTRAVENOUS | Status: AC

## 2023-06-17 MED ORDER — ENOXAPARIN SODIUM 60 MG/0.6ML IJ SOSY
1.0000 mg/kg | PREFILLED_SYRINGE | INTRAMUSCULAR | Status: AC
  Administered 2023-06-17 – 2023-06-18 (×2): 55 mg via SUBCUTANEOUS
  Filled 2023-06-17 (×2): qty 0.6

## 2023-06-17 MED ORDER — LOSARTAN POTASSIUM 25 MG PO TABS
25.0000 mg | ORAL_TABLET | Freq: Every day | ORAL | Status: DC
  Administered 2023-06-17 – 2023-06-20 (×4): 25 mg via ORAL
  Filled 2023-06-17 (×4): qty 1

## 2023-06-17 MED ORDER — HEPARIN BOLUS VIA INFUSION
1700.0000 [IU] | Freq: Once | INTRAVENOUS | Status: AC
  Administered 2023-06-17: 1700 [IU] via INTRAVENOUS
  Filled 2023-06-17: qty 1700

## 2023-06-17 NOTE — Assessment & Plan Note (Signed)
-  PT and OT evaluation from tomorrow

## 2023-06-17 NOTE — Consult Note (Signed)
 Cardiology Consultation   [name] MRN: [id number]; DOB: 02-21-1924  Admit date: 06/16/2023 Date of Consult: 06/17/2023  PCP:  Karie Schwalbe, MD   [facility] Providers Cardiologist:  New   Patient Profile:   Ann Odom is a 89 y.o. female with a hx of HTN, osteoporosis, GERD, HLD who is being seen 06/17/2023 for the evaluation of NSTEMI at the request of Dr. Nelson Chimes.  History of Present Illness:   Ann Odom has not seen cardiology in the past. No significant family history. No alcohol, drug, or tobacco history. She lives at [facility] and uses a walker. She stays fairly active and loves to garden.   The patient presented to the ER from [facility], may had LOC. She was found face down by her neighbor. Her daughter was trying to reach her, but couldn't. She contacted the facility and she was found face down. Per daughter report she might have been out for 3-4 hours. The patient denies chest pain, SOB, palpitations, lightheadedness or dizziness. She remembers feeling weak.   In the ER BP 138/64, pulse 82, RR 16, afebrile, 100% O2. Labs showed K 2.8, BG 112, AST 306, ALT 86, WBC 18.2. HS trop 5784>6962, LA 2.6, BNP 962. CK 8918. CT head negative. CXR non-acute. EKG showed NSR, RBBB,  with minimal STE lateral leads with TWI V1-V4. Started on IV heparin and admitted.    Past Medical History:  Diagnosis Date   Diverticulosis    GERD (gastroesophageal reflux disease)    Hyperlipidemia    Hypertension    Osteoporosis    Polymyalgia rheumatica (HCC)    Wears dentures    partial upper and lower   Wears hearing aid in both ears     Past Surgical History:  Procedure Laterality Date   ANKLE FRACTURE SURGERY  07/12/2005   CATARACT EXTRACTION  2012   right   CATARACT EXTRACTION W/PHACO Left 09/07/2020   Procedure: CATARACT EXTRACTION PHACO AND INTRAOCULAR LENS PLACEMENT (IOC) LEFT 8.30 01:09.7;  Surgeon: Lockie Mola, MD;  Location: [facility];  Service: Ophthalmology;  Laterality: Left;  requests early as possible   TONSILLECTOMY       Home Medications:  Prior to Admission medications   Medication Sig Start Date End Date Taking? Authorizing Provider  esomeprazole (NEXIUM) 20 MG capsule Take 20 mg by mouth daily at 12 noon.   Yes [provider]  losartan-hydrochlorothiazide (HYZAAR) 50-12.5 MG tablet TAKE 1 TABLET EVERY DAY 02/15/23  Yes Tillman Abide I, MD  melatonin 5 MG TABS Take 5 mg by mouth at bedtime as needed.   Yes [provider]  Multiple Vitamin (MULTIVITAMIN) capsule Take 1 capsule by mouth daily.   Yes [provider]  Naproxen Sodium 220 MG CAPS Take 1 capsule by mouth 2 (two) times daily as needed.   Yes [provider]  OVER THE COUNTER MEDICATION Curcumin 2K Take 1 Daily   Yes [provider]  prednisoLONE acetate (PRED FORTE) 1 % ophthalmic suspension Place 1 drop into both eyes at bedtime. 11/28/22  Yes [provider]  UNABLE TO FIND Med Name: CBD Oil   Yes [provider]  glucose blood (TRUE METRIX BLOOD GLUCOSE TEST) test strip Use to check blood sugar once daily 02/23/21   Karie Schwalbe, MD  TRUEplus Lancets 33G MISC Use to obtain blood sugar sample once daily 02/23/21   Karie Schwalbe, MD    Inpatient Medications: Scheduled  Meds:  aspirin EC  81 mg Oral Daily   multivitamin  1 tablet Oral Daily   pantoprazole  40 mg Oral Daily   potassium chloride  40 mEq Oral BID   sodium chloride flush  3 mL Intravenous Q12H   sodium chloride flush  3 mL Intravenous Q12H   Continuous Infusions:  sodium chloride     heparin 800 Units/hr (06/17/23 0358)   PRN Meds: sodium chloride, acetaminophen **OR** acetaminophen, melatonin, ondansetron **OR** ondansetron (ZOFRAN) IV, polyethylene glycol, sodium chloride flush  Allergies:    Allergies  Allergen Reactions   Alendronate Sodium     REACTION: GI    Social History:   Social History    Socioeconomic History   Marital status: Widowed    Spouse name: Not on file   Number of children: 2   Years of education: Not on file   Highest education level: Not on file  Occupational History   Occupation: House wife, farming  Tobacco Use   Smoking status: Never    Passive exposure: Never   Smokeless tobacco: Never  Vaping Use   Vaping status: Never Used  Substance and Sexual Activity   Alcohol use: Yes    Alcohol/week: 0.0 standard drinks of alcohol    Comment: wine   Drug use: No   Sexual activity: Not on file  Other Topics Concern   Not on file  Social History Narrative   Has living will   1 living daughter is  health care POA.     Has DNR already   Request no feeding tube.  Does not want extended ventilator support.   Social Drivers of Corporate investment banker Strain: Not on file  Food Insecurity: No Food Insecurity (06/17/2023)   Hunger Vital Sign    Worried About Running Out of Food in the Last Year: Never true    Ran Out of Food in the Last Year: Never true  Transportation Needs: No Transportation Needs (06/17/2023)   PRAPARE - Administrator, Civil Service (Medical): No    Lack of Transportation (Non-Medical): No  Physical Activity: Not on file  Stress: Not on file  Social Connections: Unknown (06/17/2023)   Social Connection and Isolation Panel [NHANES]    Frequency of Communication with Friends and Family: More than three times a week    Frequency of Social Gatherings with Friends and Family: More than three times a week    Attends Religious Services: Patient unable to answer    Active Member of Clubs or Organizations: Patient unable to answer    Attends Banker Meetings: Patient unable to answer    Marital Status: Married  Catering manager Violence: Not At Risk (06/17/2023)   Humiliation, Afraid, Rape, and Kick questionnaire    Fear of Current or Ex-Partner: No    Emotionally Abused: No    Physically Abused: No    Sexually  Abused: No    Family History:    Family History  Problem Relation Age of Onset   Arthritis Mother    Heart disease Father 85   Diabetes Brother    Cancer Sister        ovarian   Stroke Sister    Heart disease Daughter    Rheum arthritis Daughter      ROS:  Please see the history of present illness.   All other ROS reviewed and negative.     Physical Exam/Data:   Vitals:   06/17/23 0205 06/17/23 0300  06/17/23 0400 06/17/23 0514  BP:  (!) 119/52 (!) 133/51 (!) 123/54  Pulse:  81 91 83  Resp:  18 19 20   Temp: (!) 97.3 F (36.3 C)   98.3 F (36.8 C)  TempSrc: Axillary   Oral  SpO2:  99% 100% 97%  Weight:    55.9 kg  Height:        Intake/Output Summary (Last 24 hours) at 06/17/2023 0810 Last data filed at 06/17/2023 0100 Gross per 24 hour  Intake 579.32 ml  Output 500 ml  Net 79.32 ml      06/17/2023    5:14 AM 06/16/2023    4:46 PM 06/16/2023    1:45 PM  Last 3 Weights  Weight (lbs) 123 lb 3.8 oz 143 lb 8.3 oz 143 lb 9.6 oz  Weight (kg) 55.9 kg 65.1 kg 65.137 kg     Body mass index is 25.76 kg/m.  General:  Well nourished, well developed, in no acute distress HEENT: normal Neck: no JVD Vascular: No carotid bruits; Distal pulses 2+ bilaterally Cardiac:  normal S1, S2; RRR; + murmur  Lungs:  clear to auscultation bilaterally, no wheezing, rhonchi or rales  Abd: soft, nontender, no hepatomegaly  Ext: no edema Musculoskeletal:  No deformities, BUE and BLE strength normal and equal Skin: warm and dry  Neuro:  CNs 2-12 intact, no focal abnormalities noted Psych:  Normal affect   EKG:  The EKG was personally reviewed and demonstrates:  EKG showed NSR, RBBB,  with minimal STE lateral leads with TWI V1-V4. Telemetry:  Telemetry was personally reviewed and demonstrates:  NSR 70s  Relevant CV Studies:  Echo ordered  Laboratory Data:  High Sensitivity Troponin:   Recent Labs  Lab 06/16/23 1447 06/16/23 1740  TROPONINIHS 7,590* 8,231*     Chemistry Recent  Labs  Lab 06/16/23 1447 06/17/23 0242  NA 136 136  K 2.8* 3.9  CL 100 104  CO2 22 19*  GLUCOSE 112* 92  BUN 23 27*  CREATININE 0.89 0.92  CALCIUM 9.7 9.5  MG 2.0  --   GFRNONAA 59* 57*  ANIONGAP 14 13    Recent Labs  Lab 06/16/23 1447 06/17/23 0242  PROT 6.4* 5.8*  ALBUMIN 3.8 3.3*  AST 306* 235*  ALT 86* 74*  ALKPHOS 40 39  BILITOT 2.0* 1.4*   Lipids  Recent Labs  Lab 06/17/23 0242  CHOL 178  TRIG 73  HDL 82  LDLCALC 81  CHOLHDL 2.2    Hematology Recent Labs  Lab 06/16/23 1447 06/17/23 0242  WBC 18.2* 17.1*  RBC 4.55 4.06  HGB 14.5 13.1  HCT 40.0 36.4  MCV 87.9 89.7  MCH 31.9 32.3  MCHC 36.3* 36.0  RDW 11.9 12.1  PLT 192 174   Thyroid No results for input(s): "TSH", "FREET4" in the last 168 hours.  BNP Recent Labs  Lab 06/16/23 1447  BNP 962.9*    DDimer No results for input(s): "DDIMER" in the last 168 hours.   Radiology/Studies:  CT Cervical Spine Wo Contrast Result Date: 06/16/2023 CLINICAL DATA:  Facial trauma. EXAM: CT CERVICAL SPINE WITHOUT CONTRAST TECHNIQUE: Multidetector CT imaging of the cervical spine was performed without intravenous contrast. Multiplanar CT image reconstructions were also generated. RADIATION DOSE REDUCTION: This exam was performed according to the departmental dose-optimization program which includes automated exposure control, adjustment of the mA and/or kV according to patient size and/or use of iterative reconstruction technique. COMPARISON:  None Available. FINDINGS: Alignment: Normal. Skull base and vertebrae: No  acute fracture. No primary bone lesion or focal pathologic process. Soft tissues and spinal canal: No prevertebral fluid or swelling. No visible canal hematoma. Disc levels: There is disc space narrowing and endplate osteophyte formation throughout the cervical spine most significant at C5-C6 and C6-C7. No significant central canal or neural foraminal stenosis at any level. Upper chest: Negative. Other:  Bilateral thyroid nodules are present measuring up to 1 cm. IMPRESSION: Impression 1. No acute fracture or traumatic subluxation of the cervical spine. 2. Mild degenerative changes. 3. Thyroid nodules measuring up to 1 cm. No follow-up imaging recommended. (Ref: J Am Coll Radiol. 2015 Feb;12(2): 143-50). Electronically Signed   By: Darliss Cheney M.D.   On: 06/16/2023 17:20   CT Head Wo Contrast Result Date: 06/16/2023 CLINICAL DATA:  Trauma. EXAM: CT HEAD WITHOUT CONTRAST TECHNIQUE: Contiguous axial images were obtained from the base of the skull through the vertex without intravenous contrast. RADIATION DOSE REDUCTION: This exam was performed according to the departmental dose-optimization program which includes automated exposure control, adjustment of the mA and/or kV according to patient size and/or use of iterative reconstruction technique. COMPARISON:  None Available. FINDINGS: Brain: No evidence of acute infarction, hemorrhage, hydrocephalus, extra-axial collection or mass lesion/mass effect. There is mild periventricular white matter hypodensity, likely chronic small vessel ischemic change. Vascular: No hyperdense vessel or unexpected calcification. Skull: Normal. Negative for fracture or focal lesion. Sinuses/Orbits: No acute finding. Other: None. IMPRESSION: No acute intracranial abnormality. Electronically Signed   By: Darliss Cheney M.D.   On: 06/16/2023 16:41   DG Chest Port 1 View Result Date: 06/16/2023 CLINICAL DATA:  Questionable sepsis. EXAM: PORTABLE CHEST 1 VIEW COMPARISON:  None available. FINDINGS: Shallow inspiration. No focal consolidation or pneumothorax. Trace pleural effusions may be present. The cardiac silhouette is within limits. No acute osseous pathology. IMPRESSION: No focal consolidation. Trace pleural effusions may be present. Electronically Signed   By: Elgie Collard M.D.   On: 06/16/2023 16:29   DG HIP UNILAT WITH PELVIS 2-3 VIEWS RIGHT Result Date: 06/16/2023 CLINICAL  DATA:  Fall. EXAM: DG HIP (WITH OR WITHOUT PELVIS) 2-3V RIGHT COMPARISON:  None Available. FINDINGS: Pelvis is intact with normal and symmetric sacroiliac joints. No acute fracture or dislocation. No aggressive osseous lesion. Visualized sacral arcuate lines are unremarkable. Unremarkable symphysis pubis. There are mild degenerative changes of bilateral hip joints without significant joint space narrowing. Osteophytosis of the superior acetabulum. No radiopaque foreign bodies. IMPRESSION: No acute osseous abnormality of the pelvis or right hip joint. Electronically Signed   By: Jules Schick M.D.   On: 06/16/2023 16:29     Assessment and Plan:   NSTEMI -patient was found down by neighbor, may have been down 3-4 hours. In the ER she was found to have elevated HS troponin, elevated BNP, elevated CK, elevated WBC and Lactic acid. EKG showed NSR, RBBB with min STE lat leads and TWI anteroseptal leads started on IV heparin - no prior h/o CAD - no chest pain reported - HS trop 7829>5621. Continue to trend to peak - echo ordered - continue IV heparin - A1C and lipids ordered - she is not very functional at baseline - continue ASA 81mg  daily - may be a degree of supply demand  mismatch, but given troponin levels suspect degree of CAD. Given age and comorbidites conservative management may be best option, which daughter is in agreement with. Continue IV heparin x 48 hours  Rhabdomyolysis - CK 3086>5784 - IVF - continue to trend  Lactic  acidosis Leukocytosis - suspect non infectious, abx held - improving  HTN - PTA losartan-hydrochlorothiazide - pressure good - restart ARB as able  Hypokalemia - K 2.8 - supplement with goal>4  Transaminitis - may be from congestion - trending down  HLD - PTA not on statin - LDL 81  For questions or updates, please contact [facility] Please consult [email or web address] for contact info under    Signed, Morelia Cassells David Stall, PA-C  06/17/2023 8:10  AM

## 2023-06-17 NOTE — Plan of Care (Signed)

## 2023-06-17 NOTE — Progress Notes (Signed)
 Progress Note   Patient: Ann Odom KGM:[id number] DOB: 07/13/1923 DOA: 06/16/2023     1 DOS: the patient was seen and examined on 06/17/2023   Brief hospital course: Ann Odom is a 89 y.o. female with medical history significant of hypertension, osteoporosis, GERD and hyperlipidemia was sent to ED from [facility] independent living facility when a neighbor found her on the ground facedown.   Patient has baseline is fully functional, last known well was around 9 AM when [facility] facility talked with her on phone.  She when outside to do some gardening and does not remember anything what happened, appears little confused.  When daughter could not reach her she called the neighbor to check on her and they found her on ground facedown in her lawn.  EMS was called.  No chest pain or shortness of breath.  She could not recall anything.  On presentation vitals stable, labs pertinent for leukocytosis at 18.2, potassium 2.8, troponin elevated at 7590 and INR of 1.3.  Transaminitis with AST 306, ALT 86, T. bili 2, lactic acid of 2.6. Trauma imaging which includes CT head, cervical spine and hip was negative for any acute bony abnormality.  CT cervical spine with a thyroid nodule of about 1 cm with no follow-up imaging recommended.  CXR with no acute abnormality   EKG reviewed by me with sinus sinus rhythm and ST changes in anteroseptal leads.  No recent EKG to compare with.  Patient was started on heparin infusion and cardiology was also consulted.  Echocardiogram ordered.  4/5: Vital stable, improving transaminitis and leukocytosis, CO2 of 19, hypokalemia and lactic acidosis resolved.  Echocardiogram with normal EF, grade 1 diastolic dysfunction and no regional wall motion abnormalities.  Troponin peaked at 8231 and now trending down.  CK improving, continuing IV fluid for another day.  Assessment and Plan: * NSTEMI (non-ST elevated myocardial infarction) (HCC) Denies any chest pain but initial  troponin at 7500, EKG with ST changes in anteroseptal leads.  No recent EKG to compare with. No prior cardiac history.  Family only wants medical management.  Troponin peaked at 8231 and now started trending down.  Echocardiogram with normal EF, grade 1 diastolic dysfunction and no regional wall motion abnormalities Cardiology is on board-just medical management -Heparin infusion-continue for total of 48 hours -Lipid panel normal-A1c pending -Continue to monitor   Rhabdomyolysis Likely due to lying down for few hours.  CK at [phone] -Continue IV fluid for another day - Monitor CK   Lactic acidosis Resolved Likely due to hypoperfusion.  UA not consistent with UTI Preliminary blood cultures negative -Holding off on antibiotics   Hypokalemia Resolved with repletion.  Magnesium was normal -Continue to monitor  Transaminitis Likely due to shock liver with NSTEMI and hypoperfusion. Started slowly improving. - Continue to monitor - Holding to check acute hepatitis panel but if continue to trend up we can check.  Leukocytosis Likely reactive with NSTEMI, seems slowly improving -Continue to monitor -Holding antibiotics  Hypertension Patient takes losartan at home.  Currently blood pressure within goal -Restarting low-dose losartan  GERD (gastroesophageal reflux disease) - Takes Nexium at home -Continue with PPI  Hyperlipidemia - Lipid panel normal -Not on any statin at home -Hold statin for now due to rhabdomyolysis and transaminitis  Fall -PT and OT evaluation from tomorrow   Subjective: Patient was seen and examined today.  Denies any chest pain.  Having mild shortness of breath.  Daughter and niece at bedside  Physical  Exam: Vitals:   06/17/23 0300 06/17/23 0400 06/17/23 0514 06/17/23 0917  BP: (!) 119/52 (!) 133/51 (!) 123/54 (!) 128/59  Pulse: 81 91 83 78  Resp: 18 19 20 18   Temp:   98.3 F (36.8 C) 98.4 F (36.9 C)  TempSrc:   Oral Oral  SpO2: 99% 100%  97% 99%  Weight:   55.9 kg   Height:       General.  Frail elderly lady, in no acute distress. Pulmonary.  Lungs clear bilaterally, normal respiratory effort. CV.  Regular rate and rhythm, no JVD, rub or murmur. Abdomen.  Soft, nontender, nondistended, BS positive. CNS.  Alert and oriented .  No focal neurologic deficit. Extremities.  No edema, no cyanosis, pulses intact and symmetrical. Psychiatry.  Judgment and insight appears normal.   Data Reviewed: Prior data reviewed  Family Communication: Discussed with daughter and niece at bedside  Disposition: Status is: Inpatient Remains inpatient appropriate because: Severity of illness  Planned Discharge Destination:  To be determined  DVT prophylaxis.  Heparin infusion Time spent: 50 minutes  This record has been created using Conservation officer, historic buildings. Errors have been sought and corrected,but may not always be located. Such creation errors do not reflect on the standard of care.   Author: Arnetha Courser, MD 06/17/2023 11:54 AM  For on call review [email or web address].

## 2023-06-17 NOTE — Hospital Course (Addendum)
 Ann Odom is a 89 y.o. female with medical history significant of hypertension, osteoporosis, GERD and hyperlipidemia was sent to ED from [facility] independent living facility when a neighbor found her on the ground facedown.   Patient has baseline is fully functional, last known well was around 9 AM when [facility] facility talked with her on phone.  She when outside to do some gardening and does not remember anything what happened, appears little confused.  When daughter could not reach her she called the neighbor to check on her and they found her on ground facedown in her lawn.  EMS was called.  No chest pain or shortness of breath.  She could not recall anything.  On presentation vitals stable, labs pertinent for leukocytosis at 18.2, potassium 2.8, troponin elevated at 7590 and INR of 1.3.  Transaminitis with AST 306, ALT 86, T. bili 2, lactic acid of 2.6. Trauma imaging which includes CT head, cervical spine and hip was negative for any acute bony abnormality.  CT cervical spine with a thyroid nodule of about 1 cm with no follow-up imaging recommended.  CXR with no acute abnormality   EKG reviewed by me with sinus sinus rhythm and ST changes in anteroseptal leads.  No recent EKG to compare with.  Patient was started on heparin infusion and cardiology was also consulted.  Echocardiogram ordered.  4/5: Vital stable, improving transaminitis and leukocytosis, CO2 of 19, hypokalemia and lactic acidosis resolved.  Echocardiogram with normal EF, grade 1 diastolic dysfunction and no regional wall motion abnormalities.  Troponin peaked at 8231 and now trending down.  CK improving, continuing IV fluid for another day.  4/6: Hemodynamically stable.  Heparin infusion and IV fluid has been discontinued.  CK continued to improved.  PT is recommending short-term rehab.  4/7: Remains stable.  Awaiting insurance authorization for SNF.  4/8: Remained hemodynamically stable, improving physical activity.   Obtained insurance authorization and patient is being discharged to rehab before going back to her independent living facility.  Patient was started on baby aspirin, lipid profile was normal and due to elevated CK and mild transaminitis statin was held.  Her cardiologist can start if needed.  Patient will continue on current medications and need to have a close follow-up with her providers for further recommendations.

## 2023-06-17 NOTE — Progress Notes (Signed)
  Echocardiogram 2D Echocardiogram has been performed.  Lenor Coffin 06/17/2023, 9:15 AM

## 2023-06-17 NOTE — Progress Notes (Signed)
 PHARMACY - ANTICOAGULATION CONSULT NOTE  Pharmacy Consult for Heparin Infusion Indication: chest pain/ACS  Allergies  Allergen Reactions   Alendronate Sodium     REACTION: GI    Patient Measurements: Height: 4' 9.99" (147.3 cm) Weight: 65.1 kg (143 lb 8.3 oz) IBW/kg (Calculated) : 40.88 HEPARIN DW (KG): 55.3  Vital Signs: Temp: 97.3 F (36.3 C) (04/05 0205) Temp Source: Axillary (04/05 0205) BP: 119/52 (04/05 0300) Pulse Rate: 81 (04/05 0300)  Labs: Recent Labs    06/16/23 1447 06/16/23 1740 06/17/23 0242  HGB 14.5  --  13.1  HCT 40.0  --  36.4  PLT 192  --  174  APTT  --  34  --   LABPROT 16.3*  --   --   INR 1.3*  --   --   HEPARINUNFRC  --   --  0.16*  CREATININE 0.89  --  0.92  CKTOTAL 8,918*  --   --   TROPONINIHS 7,590* 8,231*  --     Estimated Creatinine Clearance: 27.9 mL/min (by C-G formula based on SCr of 0.92 mg/dL).   Medical History: Past Medical History:  Diagnosis Date   Diverticulosis    GERD (gastroesophageal reflux disease)    Hyperlipidemia    Hypertension    Osteoporosis    Polymyalgia rheumatica (HCC)    Wears dentures    partial upper and lower   Wears hearing aid in both ears     Medications:  Not on anticoagulation at home per chart review  Assessment: Patient is a 89 year old female who presents after a fall. Patient did not report chest pain but EKG showed some ST changes. Troponin was elevated as well at 7590. Pharmacy was consulted to initiate patient on a heparin infusion for ACS/STEMI.   Baseline INR 1.3; aPTT ordered.  No signs/symptoms of bleeding noted in chart. CT head was without acute bleed. Hgb 14.5. PLT 192.  Goal of Therapy:  Heparin level 0.3-0.7 units/ml Monitor platelets by anticoagulation protocol: Yes  04/05 0242 HL 0.16, subtherapeutic   Plan:  Give 1700 unit bolus x 1 Increase heparin infusion rate to 800 units/hr Recheck heparin level in 8 hours after rate change Monitor CBC daily  Otelia Sergeant, PharmD, [facility] 06/17/2023 3:37 AM

## 2023-06-18 ENCOUNTER — Other Ambulatory Visit: Payer: Self-pay

## 2023-06-18 DIAGNOSIS — E876 Hypokalemia: Secondary | ICD-10-CM | POA: Diagnosis not present

## 2023-06-18 DIAGNOSIS — E872 Acidosis, unspecified: Secondary | ICD-10-CM | POA: Diagnosis not present

## 2023-06-18 DIAGNOSIS — I214 Non-ST elevation (NSTEMI) myocardial infarction: Secondary | ICD-10-CM | POA: Diagnosis not present

## 2023-06-18 DIAGNOSIS — M6282 Rhabdomyolysis: Secondary | ICD-10-CM | POA: Diagnosis not present

## 2023-06-18 LAB — CBC
HCT: 30.5 % — ABNORMAL LOW (ref 36.0–46.0)
HCT: 32.5 % — ABNORMAL LOW (ref 36.0–46.0)
Hemoglobin: 10.8 g/dL — ABNORMAL LOW (ref 12.0–15.0)
Hemoglobin: 11.7 g/dL — ABNORMAL LOW (ref 12.0–15.0)
MCH: 31.8 pg (ref 26.0–34.0)
MCH: 32.1 pg (ref 26.0–34.0)
MCHC: 35.4 g/dL (ref 30.0–36.0)
MCHC: 36 g/dL (ref 30.0–36.0)
MCV: 89.7 fL (ref 80.0–100.0)
MCV: 89.3 fL (ref 80.0–100.0)
Platelets: 139 10*3/uL — ABNORMAL LOW (ref 150–400)
Platelets: 140 10*3/uL — ABNORMAL LOW (ref 150–400)
RBC: 3.4 MIL/uL — ABNORMAL LOW (ref 3.87–5.11)
RBC: 3.64 MIL/uL — ABNORMAL LOW (ref 3.87–5.11)
RDW: 12.5 % (ref 11.5–15.5)
RDW: 12.6 % (ref 11.5–15.5)
WBC: 9.8 10*3/uL (ref 4.0–10.5)
WBC: 10.9 10*3/uL — ABNORMAL HIGH (ref 4.0–10.5)
nRBC: 0 % (ref 0.0–0.2)
nRBC: 0 % (ref 0.0–0.2)

## 2023-06-18 LAB — COMPREHENSIVE METABOLIC PANEL WITH GFR
ALT: 61 U/L — ABNORMAL HIGH (ref 0–44)
AST: 135 U/L — ABNORMAL HIGH (ref 15–41)
Albumin: 2.8 g/dL — ABNORMAL LOW (ref 3.5–5.0)
Alkaline Phosphatase: 35 U/L — ABNORMAL LOW (ref 38–126)
Anion gap: 3 — ABNORMAL LOW (ref 5–15)
BUN: 23 mg/dL (ref 8–23)
CO2: 25 mmol/L (ref 22–32)
Calcium: 8.9 mg/dL (ref 8.9–10.3)
Chloride: 106 mmol/L (ref 98–111)
Creatinine, Ser: 0.77 mg/dL (ref 0.44–1.00)
GFR, Estimated: >60 mL/min (ref >60)
Glucose, Bld: 107 mg/dL — ABNORMAL HIGH (ref 70–99)
Potassium: 4.4 mmol/L (ref 3.5–5.1)
Sodium: 134 mmol/L — ABNORMAL LOW (ref 135–145)
Total Bilirubin: 0.9 mg/dL (ref 0.0–1.2)
Total Protein: 4.9 g/dL — ABNORMAL LOW (ref 6.5–8.1)

## 2023-06-18 LAB — CK: Total CK: 1553 U/L — ABNORMAL HIGH (ref 38–234)

## 2023-06-18 NOTE — Evaluation (Signed)
 Physical Therapy Evaluation Patient Details Name: Ann Odom MRN: [id number] DOB: 1923/10/16 Today's Date: 06/18/2023  History of Present Illness  Ann Odom is a 89yoF who comes to [facility] on 06/16/23 after mechanical fall same day while gardening. When daughter could not reach her she called the neighbor to check on her and they found her on ground facedown in her lawn. Leukocytosis at 18.2, potassium 2.8, troponin elevated at 7590 and INR of 1.3,CK at 8918.  Clinical Impression  Pt up in chair on arrival, 5 visitors at bedside, some of whom clear out. Pt pleasant and motivated to return to baseline mobility ritual. Pt c/o of acute BLE weakness more than anything else, more effort to perfomr transfers, however pt able to AMB 161ft with RW without much difficulty, mostly talks about the differences between RW and her 4WW at home. Remains unclear exactly how pt ended up on ground while gardening. Pt would be well poised to utilize STR level care until she has returned to baseline level of mobility. DTR is visiting from [facility] and is unable to stay and provide assistance at DC. Will follow.       If plan is discharge home, recommend the following: A little help with bathing/dressing/bathroom;A little help with walking and/or transfers;Help with stairs or ramp for entrance;Assist for transportation;Direct supervision/assist for medications management;Supervision due to cognitive status   Can travel by private vehicle   Yes    Equipment Recommendations None recommended by PT  Recommendations for Other Services       Functional Status Assessment Patient has had a recent decline in their functional status and demonstrates the ability to make significant improvements in function in a reasonable and predictable amount of time.     Precautions / Restrictions Precautions Precautions: Fall Restrictions Weight Bearing Restrictions Per Provider Order: No      Mobility  Bed Mobility                     Transfers Overall transfer level: Needs assistance Equipment used: Rolling walker (2 wheels) Transfers: Sit to/from Stand Sit to Stand: Supervision           General transfer comment: heavy effort from recliner, much harder than baseline    Ambulation/Gait Ambulation/Gait assistance: Supervision, Contact guard assist Gait Distance (Feet): 140 Feet Assistive device: Rolling walker (2 wheels) Gait Pattern/deviations: Step-to pattern Gait velocity: 0.11m/s     General Gait Details: slow, but steady and confident; feels more limited by transfer  Stairs            Wheelchair Mobility     Tilt Bed    Modified Rankin (Stroke Patients Only)       Balance                                             Pertinent Vitals/Pain Pain Assessment Pain Assessment: No/denies pain    Home Living Family/patient expects to be discharged to:: Private residence Living Arrangements: Alone Available Help at Discharge: Available PRN/intermittently Type of Home:  ([facility]F [facility] at [facility]) Home Access: Level entry       Home Layout: One level Home Equipment: Rollator (4 wheels);Grab bars - toilet;Grab bars - tub/shower      Prior Function Prior Level of Function : Independent/Modified Independent             Mobility Comments:  1OX use for nearly all mobility, has progressively become limited to essentiall household distance AMB over past months; encouraged to refrain from longer distance walking; reports no limitations in daily AMB ADLs Comments: MOD I-I for ADL, stands in shower to bathe; PRN assist for IADLs but largely MOD I-I     Extremity/Trunk Assessment   Upper Extremity Assessment Upper Extremity Assessment: Generalized weakness    Lower Extremity Assessment Lower Extremity Assessment: Generalized weakness       Communication   Communication Communication: Impaired Factors Affecting Communication: Hearing impaired     Cognition                                         Cueing       General Comments General comments (skin integrity, edema, etc.): spo2 >90% on RA throughout, HR 90s bpm post mobility    Exercises     Assessment/Plan    PT Assessment Patient needs continued PT services  PT Problem List Decreased strength;Decreased range of motion;Decreased activity tolerance;Decreased balance;Decreased mobility;Decreased safety awareness;Decreased knowledge of precautions       PT Treatment Interventions DME instruction;Gait training;Stair training;Functional mobility training;Therapeutic activities;Therapeutic exercise;Balance training;Patient/family education    PT Goals (Current goals can be found in the Care Plan section)  Acute Rehab PT Goals Patient Stated Goal: regain mobility status of baseline PT Goal Formulation: With patient Time For Goal Achievement: 07/02/23 Potential to Achieve Goals: Fair    Frequency Min 2X/week     Co-evaluation               AM-PAC PT "6 Clicks" Mobility  Outcome Measure Help needed turning from your back to your side while in a flat bed without using bedrails?: A Lot Help needed moving from lying on your back to sitting on the side of a flat bed without using bedrails?: A Lot Help needed moving to and from a bed to a chair (including a wheelchair)?: A Little Help needed standing up from a chair using your arms (e.g., wheelchair or bedside chair)?: A Little Help needed to walk in hospital room?: A Little Help needed climbing 3-5 steps with a railing? : A Lot 6 Click Score: 15    End of Session Equipment Utilized During Treatment: Gait belt Activity Tolerance: Patient tolerated treatment well;Patient limited by fatigue Patient left: in chair;with call bell/phone within reach;with nursing/sitter in room;with family/visitor present (MD and DTR) Nurse Communication: Mobility status PT Visit Diagnosis: Difficulty in walking, not  elsewhere classified (R26.2);Other abnormalities of gait and mobility (R26.89);History of falling (Z91.81)    Time: 1210-1230 PT Time Calculation (min) (ACUTE ONLY): 20 min   Charges:   PT Evaluation $PT Eval Moderate Complexity: 1 Mod   PT General Charges $$ ACUTE PT VISIT: 1 Visit       4:29 PM, 06/18/23 Rosamaria Lints, PT, DPT Physical Therapist - [facility]  [phone] (ASCOM)    Tylar Merendino C 06/18/2023, 4:26 PM

## 2023-06-18 NOTE — Assessment & Plan Note (Signed)
 Likely due to lying down for few hours.  CK at 8918>>5272>>1553 - Encourage p.o. hydration

## 2023-06-18 NOTE — Evaluation (Signed)
 Occupational Therapy Evaluation Patient Details Name: Ann Odom MRN: [id number] DOB: 1924-01-13 Today's Date: 06/18/2023   History of Present Illness   Pt is a 89 year old female presenting to the ED after being found down, admitted with NSTEMI, rhabdomyolysis, hypokalemia, transaminitis    PMH significant for hypertension, osteoporosis, GERD and hyperlipidemia     Clinical Impressions Chart reviewed, pt greeted in chair with daughter/niece present, alert and oriented x4, agreeable to OT evaluation. PTA pt lives in ILF and is MOD I-I for ADL,PRN assist for IADL (has a cleaner 1x per month), amb with rollator. Pt presents with deficits in activity tolerance, balance, strength on this date affecting safe and optimal ADL completion. STS completed with MIN-MOD A, amb in room with CGA-MIN A with RW, toilet transfer with MOD A, MAX A for toileting/LB dressing. Pt returned to bed with MIN A. Pt is performing ADL/functional mobility below PLOF, will benefit from acute OT to address deficits and to facilitate optimal ADL performance. Anticipate the need for < 3 hrs of skilled therapy to address deficits. Pt is left in bed, all needs met. OT will follow acutely.      If plan is discharge home, recommend the following:   A little help with walking and/or transfers;A little help with bathing/dressing/bathroom     Functional Status Assessment   Patient has had a recent decline in their functional status and demonstrates the ability to make significant improvements in function in a reasonable and predictable amount of time.     Equipment Recommendations   Other (comment) (defer to next venue of care)     Recommendations for Other Services         Precautions/Restrictions   Precautions Precautions: Fall Recall of Precautions/Restrictions: Intact     Mobility Bed Mobility Overal bed mobility: Needs Assistance Bed Mobility: Sit to Supine       Sit to supine: Min assist, HOB  elevated, Used rails   General bed mobility comments: MOD-MAX A +1 and use of bed features to boost up in bed    Transfers Overall transfer level: Needs assistance Equipment used: Rolling walker (2 wheels) Transfers: Sit to/from Stand Sit to Stand: Min assist, Mod assist           General transfer comment: MIN-MOD A from various surface heights; intermittent vcs for technique      Balance Overall balance assessment: Needs assistance Sitting-balance support: Feet supported Sitting balance-Leahy Scale: Good     Standing balance support: Bilateral upper extremity supported, During functional activity, Reliant on assistive device for balance Standing balance-Leahy Scale: Fair                             ADL either performed or assessed with clinical judgement   ADL Overall ADL's : Needs assistance/impaired Eating/Feeding: Set up;Sitting   Grooming: Set up;Sitting       Lower Body Bathing: Maximal assistance;Sit to/from stand       Lower Body Dressing: Maximal assistance Lower Body Dressing Details (indicate cue type and reason): doff/donn underwear Toilet Transfer: Moderate assistance;Ambulation;Regular Toilet;Rolling walker (2 wheels);Grab bars Toilet Transfer Details (indicate cue type and reason): MOD A for sit>stand due to lower toilet height Toileting- Clothing Manipulation and Hygiene: Maximal assistance;Sit to/from stand Toileting - Clothing Manipulation Details (indicate cue type and reason): for thoroughness, incontinent BM     Functional mobility during ADLs: Contact guard assist;Minimal assistance;Rolling walker (2 wheels) (approx 15' two attempts  with RW)       Vision Baseline Vision/History: 1 Wears glasses Patient Visual Report: No change from baseline       Perception         Praxis         Pertinent Vitals/Pain Pain Assessment Pain Assessment: No/denies pain     Extremity/Trunk Assessment Upper Extremity Assessment Upper  Extremity Assessment: Generalized weakness   Lower Extremity Assessment Lower Extremity Assessment: Generalized weakness       Communication Communication Communication: Impaired Factors Affecting Communication: Hearing impaired   Cognition Arousal: Alert Behavior During Therapy: WFL for tasks assessed/performed Cognition: No apparent impairments                               Following commands: Impaired Following commands impaired: Only follows one step commands consistently, Follows one step commands with increased time     Cueing  General Comments   Cueing Techniques: Verbal cues  spo2 >90% on RA throughout, HR 90s bpm post mobility   Exercises Other Exercises Other Exercises: edu pt/daugther/neice re: role of OT, role of rehab, discharge recommendations, safe ADL completion, deliriumprevention   Shoulder Instructions      Home Living Family/patient expects to be discharged to:: Private residence Living Arrangements: Alone Available Help at Discharge: Available PRN/intermittently Type of Home:  (ILF [facility] at [facility]) Home Access: Level entry     Home Layout: One level     Bathroom Shower/Tub: Runner, broadcasting/film/video: Rollator (4 wheels);Grab bars - toilet;Grab bars - tub/shower          Prior Functioning/Environment Prior Level of Function : Independent/Modified Independent             Mobility Comments: amb with rollator home/short community distances ADLs Comments: MOD I-I for ADL, stands in shower to bathe; PRN assist for IADLs but largely MOD I-I    OT Problem List: Decreased activity tolerance;Impaired balance (sitting and/or standing);Decreased strength;Decreased knowledge of use of DME or AE   OT Treatment/Interventions: Self-care/ADL training;Balance training;Therapeutic exercise;Therapeutic activities;Energy conservation;DME and/or AE instruction;Patient/family education      OT Goals(Current goals can be  found in the care plan section)   Acute Rehab OT Goals Patient Stated Goal: improve function OT Goal Formulation: With patient Time For Goal Achievement: 07/02/23 Potential to Achieve Goals: Good ADL Goals Pt Will Perform Grooming: with modified independence;sitting;standing Pt Will Perform Lower Body Dressing: with modified independence;sit to/from stand;sitting/lateral leans Pt Will Transfer to Toilet: with modified independence;ambulating Pt Will Perform Toileting - Clothing Manipulation and hygiene: with modified independence;sitting/lateral leans;sit to/from stand   OT Frequency:  Min 2X/week    Co-evaluation              AM-PAC OT "6 Clicks" Daily Activity     Outcome Measure Help from another person eating meals?: None Help from another person taking care of personal grooming?: None Help from another person toileting, which includes using toliet, bedpan, or urinal?: A Lot Help from another person bathing (including washing, rinsing, drying)?: A Lot Help from another person to put on and taking off regular upper body clothing?: None Help from another person to put on and taking off regular lower body clothing?: A Lot 6 Click Score: 18   End of Session Equipment Utilized During Treatment: Gait belt;Rolling walker (2 wheels) Nurse Communication: Mobility status  Activity Tolerance: Patient tolerated treatment well Patient left:  in bed;with call bell/phone within reach;with bed alarm set  OT Visit Diagnosis: Other abnormalities of gait and mobility (R26.89);Unsteadiness on feet (R26.81)                Time: 1345-1410 OT Time Calculation (min): 25 min Charges:  OT General Charges $OT Visit: 1 Visit OT Evaluation $OT Eval Moderate Complexity: 1 Mod Oleta Mouse, OTD OTR/L  06/18/23, 3:52 PM

## 2023-06-18 NOTE — Plan of Care (Signed)

## 2023-06-18 NOTE — Assessment & Plan Note (Signed)
 Likely due to shock liver with NSTEMI and hypoperfusion. Continue to improve - Continue to monitor - Holding to check acute hepatitis panel but if continue to trend up we can check.

## 2023-06-18 NOTE — Assessment & Plan Note (Signed)
 Likely reactive with NSTEMI, resolved -Continue to monitor -Holding antibiotics

## 2023-06-18 NOTE — Progress Notes (Addendum)
   Patient Name: Ann Odom Date of Encounter: 06/18/2023 [facility] Cardiologist: None   Interval Summary  .    HS troponin and CK levels down trending. Echo showed normal EF. She was a hard stick to heparin was switched to Lovenox. She denies chest pain or SOB.   Vital Signs .    Vitals:   06/17/23 1542 06/17/23 2002 06/17/23 2325 06/18/23 0507  BP: 116/60 118/69 (!) 118/58 129/67  Pulse: 82 80 77 84  Resp: 20 19 20 20   Temp: 98.2 F (36.8 C) 97.8 F (36.6 C) 97.7 F (36.5 C) 98.1 F (36.7 C)  TempSrc: Oral Oral  Oral  SpO2: 100% 96% 97% 99%  Weight:      Height:        Intake/Output Summary (Last 24 hours) at 06/18/2023 0738 Last data filed at 06/18/2023 0622 Gross per 24 hour  Intake 2185.3 ml  Output 350 ml  Net 1835.3 ml      06/17/2023    5:14 AM 06/16/2023    4:46 PM 06/16/2023    1:45 PM  Last 3 Weights  Weight (lbs) 123 lb 3.8 oz 143 lb 8.3 oz 143 lb 9.6 oz  Weight (kg) 55.9 kg 65.1 kg 65.137 kg      Telemetry/ECG    NSR 70s - Personally Reviewed  Physical Exam .   GEN: No acute distress.   Neck: No JVD Cardiac: RRR, no murmurs, rubs, or gallops.  Respiratory: Clear to auscultation bilaterally. GI: Soft, nontender, non-distended  MS: No edema  Assessment & Plan .    NSTEMI -patient was found down by neighbor, may have been down 3-4 hours. In the ER she was found to have elevated HS troponin, elevated BNP, elevated CK, elevated WBC and Lactic acid. EKG showed NSR, RBBB with min STE lat leads and TWI anteroseptal leads started on IV heparin - no prior h/o CAD - no chest pain reported - HS trop [phone] - echo showed LVEF 60-65%, no WMA, mild LVH, G1DD, mild MR - she is not very functional at baseline, but does live ber herself at [facility] - IV heparin switched to Lovenox since she is a hard stick - Hgb 13.1>10.8  Check CBC later this afternoon - continue ASA 81mg  daily - may be supply demand  mismatch, but given troponin levels  suspect degree of CAD. Given age, lack of symptoms, and comorbidites plan for conservative management.  Rhabdomyolysis - CK [phone] - s/p IVF - continue to trend   Lactic acidosis Leukocytosis - suspect non infectious, abx held - improved   HTN - PTA losartan-hydrochlorothiazide - pressure good - continue Losartan 25mg  daily   Hypokalemia - K 2.8>>4.4 - supplement with goal>4   Transaminitis - trending down   HLD - PTA not on statin - LDL 81   For questions or updates, please contact [facility] Please consult [email or web address] for contact info under        Signed, Jared Whorley David Stall, PA-C

## 2023-06-18 NOTE — TOC Progression Note (Signed)
 Transition of Care [facility]) - Progression Note    Patient Details  Name: Ann Odom MRN: [id number] Date of Birth: [birth year]  Transition of [facility]) CM/SW Contact  Bing Quarry, RN Phone Number: 06/18/2023, 2:02 PM  Clinical Narrative:  06/18/23: Patient is form [employer]. New PT recommendations are for STR when medically ready, which per provider patient is not today. Notified AC via secure messaging.    Gabriel Cirri MSN RN CM  RN Case Manager [place]  Transitions of Care Direct Dial: [phone] (Weekends Only) [facility] Main Office Phone: [phone] [facility] Fax: [phone] [email or web address]            Expected Discharge Plan and Services                                               Social Determinants of Health (SDOH) Interventions SDOH Screenings   Food Insecurity: No Food Insecurity (06/17/2023)  Housing: Low Risk  (06/17/2023)  Transportation Needs: No Transportation Needs (06/17/2023)  Utilities: Not At Risk (06/17/2023)  Depression (PHQ2-9): Low Risk  (01/06/2023)  Social Connections: Unknown (06/17/2023)  Tobacco Use: Low Risk  (01/06/2023)    Readmission Risk Interventions     No data to display

## 2023-06-18 NOTE — Assessment & Plan Note (Signed)
 Denies any chest pain but initial troponin at 7500, EKG with ST changes in anteroseptal leads.  No recent EKG to compare with. No prior cardiac history.  Family only wants medical management.  Troponin peaked at 8231 and now started trending down.  Echocardiogram with normal EF, grade 1 diastolic dysfunction and no regional wall motion abnormalities Cardiology is on board-just medical management -Completed heparin infusion -Lipid panel normal-A1c pending -Continue to monitor

## 2023-06-18 NOTE — Progress Notes (Signed)
 Progress Note   Patient: Ann Odom NUU:[id number] DOB: 01/14/1924 DOA: 06/16/2023     2 DOS: the patient was seen and examined on 06/18/2023   Brief hospital course: LETESHA KLECKER is a 89 y.o. female with medical history significant of hypertension, osteoporosis, GERD and hyperlipidemia was sent to ED from [facility] independent living facility when a neighbor found her on the ground facedown.   Patient has baseline is fully functional, last known well was around 9 AM when [facility] facility talked with her on phone.  She when outside to do some gardening and does not remember anything what happened, appears little confused.  When daughter could not reach her she called the neighbor to check on her and they found her on ground facedown in her lawn.  EMS was called.  No chest pain or shortness of breath.  She could not recall anything.  On presentation vitals stable, labs pertinent for leukocytosis at 18.2, potassium 2.8, troponin elevated at 7590 and INR of 1.3.  Transaminitis with AST 306, ALT 86, T. bili 2, lactic acid of 2.6. Trauma imaging which includes CT head, cervical spine and hip was negative for any acute bony abnormality.  CT cervical spine with a thyroid nodule of about 1 cm with no follow-up imaging recommended.  CXR with no acute abnormality   EKG reviewed by me with sinus sinus rhythm and ST changes in anteroseptal leads.  No recent EKG to compare with.  Patient was started on heparin infusion and cardiology was also consulted.  Echocardiogram ordered.  4/5: Vital stable, improving transaminitis and leukocytosis, CO2 of 19, hypokalemia and lactic acidosis resolved.  Echocardiogram with normal EF, grade 1 diastolic dysfunction and no regional wall motion abnormalities.  Troponin peaked at 8231 and now trending down.  CK improving, continuing IV fluid for another day.  4/6: Hemodynamically stable.  Heparin infusion and IV fluid has been discontinued.  CK continued to improved.   PT is recommending short-term rehab.  Assessment and Plan: * NSTEMI (non-ST elevated myocardial infarction) (HCC) Denies any chest pain but initial troponin at 7500, EKG with ST changes in anteroseptal leads.  No recent EKG to compare with. No prior cardiac history.  Family only wants medical management.  Troponin peaked at 8231 and now started trending down.  Echocardiogram with normal EF, grade 1 diastolic dysfunction and no regional wall motion abnormalities Cardiology is on board-just medical management -Completed heparin infusion -Lipid panel normal-A1c pending -Continue to monitor   Rhabdomyolysis Likely due to lying down for few hours.  CK at 8918>>5272>>1553 - Encourage p.o. hydration  Lactic acidosis Resolved Likely due to hypoperfusion.  UA not consistent with UTI Preliminary blood cultures negative -Holding off on antibiotics   Hypokalemia Resolved with repletion.  Magnesium was normal -Continue to monitor  Transaminitis Likely due to shock liver with NSTEMI and hypoperfusion. Continue to improve - Continue to monitor - Holding to check acute hepatitis panel but if continue to trend up we can check.  Leukocytosis Likely reactive with NSTEMI, resolved -Continue to monitor -Holding antibiotics  Hypertension Patient takes losartan at home.  Currently blood pressure within goal -Restarting low-dose losartan  GERD (gastroesophageal reflux disease) - Takes Nexium at home -Continue with PPI  Hyperlipidemia - Lipid panel normal -Not on any statin at home -Hold statin for now due to rhabdomyolysis and transaminitis  Fall -PT and OT evaluation from tomorrow   Subjective: Patient was sitting comfortably in chair and eating lunch when seen today.  No chest pain or shortness of breath.  Physical Exam: Vitals:   06/17/23 2325 06/18/23 0507 06/18/23 0842 06/18/23 1142  BP: (!) 118/58 129/67 133/77 115/61  Pulse: 77 84 86 94  Resp: 20 20 17 18   Temp: 97.7 F  (36.5 C) 98.1 F (36.7 C) 98.3 F (36.8 C) 97.9 F (36.6 C)  TempSrc:  Oral Oral Oral  SpO2: 97% 99% 96% 97%  Weight:      Height:       General.  Frail and pleasant elderly lady, in no acute distress. Pulmonary.  Lungs clear bilaterally, normal respiratory effort. CV.  Regular rate and rhythm, no JVD, rub or murmur. Abdomen.  Soft, nontender, nondistended, BS positive. CNS.  Alert and oriented .  No focal neurologic deficit. Extremities.  No edema, no cyanosis, pulses intact and symmetrical. Psychiatry.  Judgment and insight appears normal.   Data Reviewed: Prior data reviewed  Family Communication: Discussed with daughter and niece at bedside  Disposition: Status is: Inpatient Remains inpatient appropriate because: Severity of illness  Planned Discharge Destination: Short-term rehab  DVT prophylaxis.  Heparin infusion Time spent: 45 minutes  This record has been created using Conservation officer, historic buildings. Errors have been sought and corrected,but may not always be located. Such creation errors do not reflect on the standard of care.   Author: Arnetha Courser, MD 06/18/2023 1:42 PM  For on call review [email or web address].

## 2023-06-18 NOTE — Assessment & Plan Note (Signed)
 Resolved with repletion.  Magnesium was normal -Continue to monitor

## 2023-06-18 NOTE — Assessment & Plan Note (Signed)
 Resolved Likely due to hypoperfusion.  UA not consistent with UTI Preliminary blood cultures negative -Holding off on antibiotics

## 2023-06-19 DIAGNOSIS — M6282 Rhabdomyolysis: Secondary | ICD-10-CM | POA: Diagnosis not present

## 2023-06-19 DIAGNOSIS — I214 Non-ST elevation (NSTEMI) myocardial infarction: Secondary | ICD-10-CM | POA: Diagnosis not present

## 2023-06-19 DIAGNOSIS — E872 Acidosis, unspecified: Secondary | ICD-10-CM | POA: Diagnosis not present

## 2023-06-19 DIAGNOSIS — E876 Hypokalemia: Secondary | ICD-10-CM | POA: Diagnosis not present

## 2023-06-19 LAB — HEMOGLOBIN A1C
Hgb A1c MFr Bld: 5.2 % (ref 4.8–5.6)
Mean Plasma Glucose: 103 mg/dL

## 2023-06-19 NOTE — TOC Progression Note (Addendum)
 Transition of Care [facility]) - Progression Note    Patient Details  Name: Ann Odom MRN: [id number] Date of Birth: August 20, 1923  Transition of Care [facility]) CM/SW Contact  Truddie Hidden, RN Phone Number: 06/19/2023, 10:37 AM  Clinical Narrative:    Attempt to reach Sue Lush at [facility].No answer. Left a message.   Spoke with patient's daughter, Richarda Osmond regarding SNF recommendation. She is agreeable to SNF at [facility]. RNCM advised patient is a resident at [facility] and is guaranteed a bed once one is available. She was advised Berkley Harvey is still required. Mollie inquired about expediting the insurance authorization. She was advised of turn around times for insurance authorization.  10:41am Spoke with Sue Lush, Admissions Coordinator for [facility]. Patient has a bed waiting and can admit once Berkley Harvey is approved.   4:15pm Per [facility] assistant Lenn Cal auth started and is pending.         Expected Discharge Plan and Services                                               Social Determinants of Health (SDOH) Interventions SDOH Screenings   Food Insecurity: No Food Insecurity (06/17/2023)  Housing: Low Risk  (06/17/2023)  Transportation Needs: No Transportation Needs (06/17/2023)  Utilities: Not At Risk (06/17/2023)  Depression (PHQ2-9): Low Risk  (01/06/2023)  Social Connections: Unknown (06/17/2023)  Tobacco Use: Low Risk  (01/06/2023)    Readmission Risk Interventions     No data to display

## 2023-06-19 NOTE — Care Management Important Message (Deleted)
 Important Message  Patient Details  Name: Ann Odom MRN: [id number] Date of Birth: 1923-11-12   Important Message Given:  Yes - Medicare IM     Marcell Anger 06/19/2023, 10:51 AM

## 2023-06-19 NOTE — Progress Notes (Signed)
 Progress Note   Patient: Ann Odom WGN:[id number] DOB: 1923-05-26 DOA: 06/16/2023     3 DOS: the patient was seen and examined on 06/19/2023   Brief hospital course: Ann Odom is a 89 y.o. female with medical history significant of hypertension, osteoporosis, GERD and hyperlipidemia was sent to ED from [facility] independent living facility when a neighbor found her on the ground facedown.   Patient has baseline is fully functional, last known well was around 9 AM when [facility] facility talked with her on phone.  She when outside to do some gardening and does not remember anything what happened, appears little confused.  When daughter could not reach her she called the neighbor to check on her and they found her on ground facedown in her lawn.  EMS was called.  No chest pain or shortness of breath.  She could not recall anything.  On presentation vitals stable, labs pertinent for leukocytosis at 18.2, potassium 2.8, troponin elevated at 7590 and INR of 1.3.  Transaminitis with AST 306, ALT 86, T. bili 2, lactic acid of 2.6. Trauma imaging which includes CT head, cervical spine and hip was negative for any acute bony abnormality.  CT cervical spine with a thyroid nodule of about 1 cm with no follow-up imaging recommended.  CXR with no acute abnormality   EKG reviewed by me with sinus sinus rhythm and ST changes in anteroseptal leads.  No recent EKG to compare with.  Patient was started on heparin infusion and cardiology was also consulted.  Echocardiogram ordered.  4/5: Vital stable, improving transaminitis and leukocytosis, CO2 of 19, hypokalemia and lactic acidosis resolved.  Echocardiogram with normal EF, grade 1 diastolic dysfunction and no regional wall motion abnormalities.  Troponin peaked at 8231 and now trending down.  CK improving, continuing IV fluid for another day.  4/6: Hemodynamically stable.  Heparin infusion and IV fluid has been discontinued.  CK continued to improved.   PT is recommending short-term rehab.  4/7: Remains stable.  Awaiting insurance authorization for SNF.  Assessment and Plan: * NSTEMI (non-ST elevated myocardial infarction) (HCC) Denies any chest pain but initial troponin at 7500, EKG with ST changes in anteroseptal leads.  No recent EKG to compare with. No prior cardiac history.  Family only wants medical management.  Troponin peaked at 8231 and now started trending down.  Echocardiogram with normal EF, grade 1 diastolic dysfunction and no regional wall motion abnormalities Cardiology is on board-just medical management -Completed heparin infusion -Lipid panel normal-A1c of 5.2 -Continue to monitor   Rhabdomyolysis Likely due to lying down for few hours.  CK at 8918>>5272>>1553 - Encourage p.o. hydration  Lactic acidosis Resolved Likely due to hypoperfusion.  UA not consistent with UTI Preliminary blood cultures negative -Holding off on antibiotics   Hypokalemia Resolved with repletion.  Magnesium was normal -Continue to monitor  Transaminitis Likely due to shock liver with NSTEMI and hypoperfusion. Continue to improve - Continue to monitor - Holding to check acute hepatitis panel but if continue to trend up we can check.  Leukocytosis Likely reactive with NSTEMI, resolved -Continue to monitor -Holding antibiotics  Hypertension Patient takes losartan at home.  Currently blood pressure within goal -Restarting low-dose losartan  GERD (gastroesophageal reflux disease) - Takes Nexium at home -Continue with PPI  Hyperlipidemia - Lipid panel normal -Not on any statin at home -Hold statin for now due to rhabdomyolysis and transaminitis  Fall -PT and OT evaluation from tomorrow   Subjective: Patient was  sitting comfortably when seen today.  No chest pain or shortness of breath.  She wants to go back to her place.  Physical Exam: Vitals:   06/18/23 2340 06/19/23 0503 06/19/23 0740 06/19/23 1156  BP: 129/66 123/64  (!) 147/77 (!) 153/66  Pulse: 83 85 77 74  Resp: 16 16    Temp: 98.5 F (36.9 C) 98.5 F (36.9 C) 98.1 F (36.7 C) 98.3 F (36.8 C)  TempSrc:  Oral    SpO2: 94% 97% 95% 97%  Weight:      Height:       General.  Frail elderly lady, in no acute distress. Pulmonary.  Lungs clear bilaterally, normal respiratory effort. CV.  Regular rate and rhythm, no JVD, rub or murmur. Abdomen.  Soft, nontender, nondistended, BS positive. CNS.  Alert and oriented .  No focal neurologic deficit. Extremities.  No edema, no cyanosis, pulses intact and symmetrical. Psychiatry.  Judgment and insight appears normal.    Data Reviewed: Prior data reviewed  Family Communication: Discussed with daughter and niece at bedside  Disposition: Status is: Inpatient Remains inpatient appropriate because: Severity of illness  Planned Discharge Destination: Short-term rehab  DVT prophylaxis.  Heparin infusion Time spent: 44 minutes  This record has been created using Conservation officer, historic buildings. Errors have been sought and corrected,but may not always be located. Such creation errors do not reflect on the standard of care.   Author: Arnetha Courser, MD 06/19/2023 12:57 PM  For on call review [email or web address].

## 2023-06-19 NOTE — Care Management Important Message (Deleted)
 Important Message  Patient Details  Name: Ann Odom MRN: [id number] Date of Birth: Jan 04, 1924   Important Message Given:  Yes - Medicare IM     Marcell Anger 06/19/2023, 11:11 AM

## 2023-06-19 NOTE — Care Management Important Message (Signed)
 Important Message  Patient Details  Name: Ann Odom MRN: [id number] Date of Birth: [birth year]   Important Message Given:  Yes - Medicare IM     Marcell Anger 06/19/2023, 10:50 AM

## 2023-06-19 NOTE — Progress Notes (Signed)
 Physical Therapy Treatment Patient Details Name: Ann Odom MRN: [id number] DOB: 1923-07-20 Today's Date: 06/19/2023   History of Present Illness Breanda Odom is a 89yoF who comes to [facility] on 06/16/23 after mechanical fall same day while gardening. When daughter could not reach her she called the neighbor to check on her and they found her on ground facedown in her lawn. Leukocytosis at 18.2, potassium 2.8, troponin elevated at 7590 and INR of 1.3,CK at 8918.    PT Comments  Pt resting in bed on entry, buy very much excited to see me, as has been wanted to walk and 'exercise' much of the morning. Pt continue to require physical assistance for bed mobility and safety cues for transfers and AMB. Pt trial on YRW today which is better fit for her. Pt continues to report significant soreness and weakness in thighs during transfers, far from her baseline. Pt given intermittent verbal cues for safety to attend to task and scan environment appropriately to task. Niece in room during session. Will continue to follow.       If plan is discharge home, recommend the following: A little help with bathing/dressing/bathroom;A little help with walking and/or transfers;Help with stairs or ramp for entrance;Assist for transportation;Direct supervision/assist for medications management;Supervision due to cognitive status   Can travel by private vehicle     Yes  Equipment Recommendations  None recommended by PT    Recommendations for Other Services       Precautions / Restrictions Precautions Precautions: Fall Recall of Precautions/Restrictions: Impaired Restrictions Weight Bearing Restrictions Per Provider Order: No     Mobility  Bed Mobility Overal bed mobility: Needs Assistance Bed Mobility: Supine to Sit, Sit to Supine     Supine to sit: Min assist Sit to supine: Min assist   General bed mobility comments: somewhat better, but still needs physical assists    Transfers Overall transfer  level: Needs assistance Equipment used: Rolling walker (2 wheels) (YRW) Transfers: Sit to/from Stand Sit to Stand: Supervision           General transfer comment: able to demons with use of hands, moderate effort required    Ambulation/Gait Ambulation/Gait assistance: Supervision, Contact guard assist Gait Distance (Feet): 140 Feet Assistive device:  (YRW) Gait Pattern/deviations: Step-through pattern (stops periodically) Gait velocity: 0.35m/s today; (0.39m/s on 06/18/23) very curious, looking left and right while continuing to walk forward     General Gait Details: slow, but steady, confident; does not maintain straight line of progression;   Stairs             Wheelchair Mobility     Tilt Bed    Modified Rankin (Stroke Patients Only)       Balance                                            Communication    Cognition Arousal: Alert Behavior During Therapy: WFL for tasks assessed/performed                           PT - Cognition Comments: easily distractible, cues to attent to task, cues to look where she's going when waking (almost walked into several objects in hallway);        Cueing    Exercises      General Comments  Pertinent Vitals/Pain Pain Assessment Pain Assessment: No/denies pain    Home Living                          Prior Function            PT Goals (current goals can now be found in the care plan section) Acute Rehab PT Goals Patient Stated Goal: regain mobility status of baseline PT Goal Formulation: With patient Time For Goal Achievement: 07/02/23 Potential to Achieve Goals: Fair Progress towards PT goals: Progressing toward goals    Frequency    Min 2X/week      PT Plan      Co-evaluation              AM-PAC PT "6 Clicks" Mobility   Outcome Measure  Help needed turning from your back to your side while in a flat bed without using bedrails?: A Lot Help  needed moving from lying on your back to sitting on the side of a flat bed without using bedrails?: A Lot Help needed moving to and from a bed to a chair (including a wheelchair)?: A Little Help needed standing up from a chair using your arms (e.g., wheelchair or bedside chair)?: A Little Help needed to walk in hospital room?: A Little Help needed climbing 3-5 steps with a railing? : A Lot 6 Click Score: 15    End of Session Equipment Utilized During Treatment: Gait belt Activity Tolerance: Patient tolerated treatment well;Patient limited by fatigue Patient left: in chair;with call bell/phone within reach;with nursing/sitter in room;with family/visitor present Nurse Communication: Mobility status PT Visit Diagnosis: Difficulty in walking, not elsewhere classified (R26.2);Other abnormalities of gait and mobility (R26.89);History of falling (Z91.81)     Time: 2440-1027 PT Time Calculation (min) (ACUTE ONLY): 15 min  Charges:    $Therapeutic Activity: 8-22 mins PT General Charges $$ ACUTE PT VISIT: 1 Visit                    2:29 PM, 06/19/23 Rosamaria Lints, PT, DPT Physical Therapist - [facility]  [phone] (ASCOM)    Jari Carollo C 06/19/2023, 2:26 PM

## 2023-06-20 DIAGNOSIS — I252 Old myocardial infarction: Secondary | ICD-10-CM | POA: Diagnosis not present

## 2023-06-20 DIAGNOSIS — R278 Other lack of coordination: Secondary | ICD-10-CM | POA: Diagnosis not present

## 2023-06-20 DIAGNOSIS — E46 Unspecified protein-calorie malnutrition: Secondary | ICD-10-CM | POA: Diagnosis not present

## 2023-06-20 DIAGNOSIS — D72829 Elevated white blood cell count, unspecified: Secondary | ICD-10-CM | POA: Diagnosis not present

## 2023-06-20 DIAGNOSIS — M6282 Rhabdomyolysis: Secondary | ICD-10-CM | POA: Diagnosis not present

## 2023-06-20 DIAGNOSIS — R2689 Other abnormalities of gait and mobility: Secondary | ICD-10-CM | POA: Diagnosis not present

## 2023-06-20 DIAGNOSIS — Z741 Need for assistance with personal care: Secondary | ICD-10-CM | POA: Diagnosis not present

## 2023-06-20 DIAGNOSIS — E872 Acidosis, unspecified: Secondary | ICD-10-CM | POA: Diagnosis not present

## 2023-06-20 DIAGNOSIS — Z79899 Other long term (current) drug therapy: Secondary | ICD-10-CM | POA: Diagnosis not present

## 2023-06-20 DIAGNOSIS — M6281 Muscle weakness (generalized): Secondary | ICD-10-CM | POA: Diagnosis not present

## 2023-06-20 DIAGNOSIS — W19XXXD Unspecified fall, subsequent encounter: Secondary | ICD-10-CM | POA: Diagnosis not present

## 2023-06-20 DIAGNOSIS — M25521 Pain in right elbow: Secondary | ICD-10-CM | POA: Diagnosis not present

## 2023-06-20 DIAGNOSIS — W19XXXA Unspecified fall, initial encounter: Secondary | ICD-10-CM | POA: Diagnosis not present

## 2023-06-20 DIAGNOSIS — Z7189 Other specified counseling: Secondary | ICD-10-CM | POA: Diagnosis not present

## 2023-06-20 DIAGNOSIS — T148XXA Other injury of unspecified body region, initial encounter: Secondary | ICD-10-CM | POA: Diagnosis not present

## 2023-06-20 DIAGNOSIS — K219 Gastro-esophageal reflux disease without esophagitis: Secondary | ICD-10-CM | POA: Diagnosis not present

## 2023-06-20 DIAGNOSIS — I5031 Acute diastolic (congestive) heart failure: Secondary | ICD-10-CM | POA: Diagnosis not present

## 2023-06-20 DIAGNOSIS — M130 Polyarthritis, unspecified: Secondary | ICD-10-CM | POA: Diagnosis not present

## 2023-06-20 DIAGNOSIS — I214 Non-ST elevation (NSTEMI) myocardial infarction: Secondary | ICD-10-CM | POA: Diagnosis not present

## 2023-06-20 DIAGNOSIS — E782 Mixed hyperlipidemia: Secondary | ICD-10-CM | POA: Diagnosis not present

## 2023-06-20 DIAGNOSIS — R600 Localized edema: Secondary | ICD-10-CM | POA: Diagnosis not present

## 2023-06-20 DIAGNOSIS — I10 Essential (primary) hypertension: Secondary | ICD-10-CM | POA: Diagnosis not present

## 2023-06-20 DIAGNOSIS — R350 Frequency of micturition: Secondary | ICD-10-CM | POA: Diagnosis not present

## 2023-06-20 DIAGNOSIS — L089 Local infection of the skin and subcutaneous tissue, unspecified: Secondary | ICD-10-CM | POA: Diagnosis not present

## 2023-06-20 DIAGNOSIS — Z9181 History of falling: Secondary | ICD-10-CM | POA: Diagnosis not present

## 2023-06-20 DIAGNOSIS — M159 Polyosteoarthritis, unspecified: Secondary | ICD-10-CM | POA: Diagnosis not present

## 2023-06-20 DIAGNOSIS — R7401 Elevation of levels of liver transaminase levels: Secondary | ICD-10-CM | POA: Diagnosis not present

## 2023-06-20 DIAGNOSIS — E876 Hypokalemia: Secondary | ICD-10-CM | POA: Diagnosis not present

## 2023-06-20 DIAGNOSIS — R410 Disorientation, unspecified: Secondary | ICD-10-CM | POA: Diagnosis not present

## 2023-06-20 MED ORDER — ASPIRIN 81 MG PO TBEC: 81.0000 mg | DELAYED_RELEASE_TABLET | Freq: Every day | ORAL | Status: DC

## 2023-06-20 MED ORDER — LOSARTAN POTASSIUM 25 MG PO TABS: 25.0000 mg | ORAL_TABLET | Freq: Every day | ORAL | Status: DC

## 2023-06-20 MED ORDER — POLYETHYLENE GLYCOL 3350 17 G PO PACK: 17.0000 g | PACK | Freq: Every day | ORAL | Status: DC | PRN

## 2023-06-20 NOTE — NC FL2 (Signed)
 [place] MEDICAID FL2 LEVEL OF CARE FORM     IDENTIFICATION  Patient Name: Ann Odom Birthdate: [birth year] Sex: female Admission Date (Current Location): 06/16/2023  [facility] and [place] Number:  Chiropodist and Address:  [facility], [address], [place], [place] [address]      Provider Number: [id number]  Attending Physician Name and Address:  Arnetha Courser, MD  Relative Name and Phone Number:  Kerin Ransom (Daughter)  [phone] (Home Phone)    Current Level of Care: Hospital Recommended Level of Care: Skilled Nursing Facility Prior Approval Number:    Date Approved/Denied:   PASRR Number: [id number]  Discharge Plan:      Current Diagnoses: Patient Active Problem List   Diagnosis Date Noted   Elevated CK 06/17/2023   NSTEMI (non-ST elevated myocardial infarction) (HCC) 06/16/2023   Rhabdomyolysis 06/16/2023   Transaminitis 06/16/2023   Hypokalemia 06/16/2023   Leukocytosis 06/16/2023   Lactic acidosis 06/16/2023   Pressure injury 06/16/2023   Abnormal EKG 06/16/2023   Fall 06/16/2023   Preventative health care 01/06/2023   Urinary retention with incomplete bladder emptying 01/03/2019   Uterine prolapse 04/30/2018   Advance directive discussed with patient 04/29/2014   Generalized osteoarthritis of multiple sites 12/09/2013   Pessary maintenance 04/25/2012   Urge incontinence 09/29/2011   ACTINIC KERATOSIS 10/30/2007   Hyperlipidemia 10/10/2006   Hypertension 10/10/2006   GERD (gastroesophageal reflux disease) 10/10/2006   Diverticulosis of colon 10/10/2006   Polymyalgia rheumatica (HCC) 10/10/2006   Osteoporosis 10/10/2006    Orientation RESPIRATION BLADDER Height & Weight     Self, Time, Situation, Place  Normal Continent Weight: 55.9 kg Height:  4' 9.99" (147.3 cm)  BEHAVIORAL SYMPTOMS/MOOD NEUROLOGICAL BOWEL NUTRITION STATUS  Other (Comment) (n/a)  (n/a) Continent Diet  AMBULATORY STATUS COMMUNICATION  OF NEEDS Skin     Verbally  (erythema, eechymosis- bilateral arm and legs, abrasion arm, leg thigh)                       Personal Care Assistance Level of Assistance  Bathing, Dressing Bathing Assistance: Limited assistance   Dressing Assistance: Limited assistance     Functional Limitations Info  Sight, Hearing Sight Info: Adequate Hearing Info: Adequate      SPECIAL CARE FACTORS FREQUENCY  PT (By licensed PT), OT (By licensed OT)     PT Frequency: Min 2x weekly OT Frequency: Min 2x weekly            Contractures Contractures Info: Not present    Additional Factors Info  Code Status, Allergies Code Status Info: DNR Allergies Info: Alendronate Sodium           Current Medications (06/20/2023):  This is the current hospital active medication list Current Facility-Administered Medications  Medication Dose Route Frequency Provider Last Rate Last Admin   acetaminophen (TYLENOL) tablet 650 mg  650 mg Oral Q6H PRN Arnetha Courser, MD       Or   acetaminophen (TYLENOL) suppository 650 mg  650 mg Rectal Q6H PRN Arnetha Courser, MD       aspirin EC tablet 81 mg  81 mg Oral Daily Arnetha Courser, MD   81 mg at 06/20/23 0820   losartan (COZAAR) tablet 25 mg  25 mg Oral Daily Arnetha Courser, MD   25 mg at 06/20/23 8469   melatonin tablet 5 mg  5 mg Oral QHS PRN Arnetha Courser, MD   5 mg at 06/19/23 2024   multivitamin (PROSIGHT)  tablet 1 tablet  1 tablet Oral Daily Arnetha Courser, MD   1 tablet at 06/20/23 0821   ondansetron (ZOFRAN) tablet 4 mg  4 mg Oral Q6H PRN Arnetha Courser, MD       Or   ondansetron (ZOFRAN) injection 4 mg  4 mg Intravenous Q6H PRN Arnetha Courser, MD       pantoprazole (PROTONIX) EC tablet 40 mg  40 mg Oral Daily Arnetha Courser, MD   40 mg at 06/20/23 0820   polyethylene glycol (MIRALAX / GLYCOLAX) packet 17 g  17 g Oral Daily PRN Arnetha Courser, MD       sodium chloride flush (NS) 0.9 % injection 3 mL  3 mL Intravenous Q12H Amin, Tilman Neat, MD   3 mL at 06/20/23  4098   sodium chloride flush (NS) 0.9 % injection 3 mL  3 mL Intravenous Q12H Arnetha Courser, MD   3 mL at 06/20/23 1191   sodium chloride flush (NS) 0.9 % injection 3 mL  3 mL Intravenous PRN Arnetha Courser, MD         Discharge Medications: Please see discharge summary for a list of discharge medications.  Relevant Imaging Results:  Relevant Lab Results:   Additional Information SSN# [id number]  Truddie Hidden, RN

## 2023-06-20 NOTE — TOC Progression Note (Signed)
 Transition of Care [facility]) - Progression Note    Patient Details  Name: Ann Odom MRN: [id number] Date of Birth: 1923/08/07  Transition of Care [facility]) CM/SW Contact  Truddie Hidden, RN Phone Number: 06/20/2023, 8:54 AM  Clinical Narrative:    Vesta Mixer pending        Expected Discharge Plan and Services                                               Social Determinants of Health (SDOH) Interventions SDOH Screenings   Food Insecurity: No Food Insecurity (06/17/2023)  Housing: Low Risk  (06/17/2023)  Transportation Needs: No Transportation Needs (06/17/2023)  Utilities: Not At Risk (06/17/2023)  Depression (PHQ2-9): Low Risk  (01/06/2023)  Social Connections: Unknown (06/17/2023)  Tobacco Use: Low Risk  (01/06/2023)    Readmission Risk Interventions     No data to display

## 2023-06-20 NOTE — Plan of Care (Signed)

## 2023-06-20 NOTE — Discharge Summary (Signed)
 Physician Discharge Summary   Patient: Ann Odom MRN: [id number] DOB: [birth year]  Admit date:     06/16/2023  Discharge date: 06/20/23  Discharge Physician: Arnetha Courser   PCP: Karie Schwalbe, MD   Recommendations at discharge:  Please obtain CBC and CMP on follow-up Follow-up with primary care provider Follow-up with cardiology  Discharge Diagnoses: Principal Problem:   NSTEMI (non-ST elevated myocardial infarction) [facility]) Active Problems:   Rhabdomyolysis   Transaminitis   Hypokalemia   Lactic acidosis   Hypertension   Leukocytosis   GERD (gastroesophageal reflux disease)   Hyperlipidemia   Fall   Pressure injury   Abnormal EKG   Elevated CK   Hospital Course: Ann Odom is a 89 y.o. female with medical history significant of hypertension, osteoporosis, GERD and hyperlipidemia was sent to ED from [facility] independent living facility when a neighbor found her on the ground facedown.   Patient has baseline is fully functional, last known well was around 9 AM when [facility] facility talked with her on phone.  She when outside to do some gardening and does not remember anything what happened, appears little confused.  When daughter could not reach her she called the neighbor to check on her and they found her on ground facedown in her lawn.  EMS was called.  No chest pain or shortness of breath.  She could not recall anything.  On presentation vitals stable, labs pertinent for leukocytosis at 18.2, potassium 2.8, troponin elevated at 7590 and INR of 1.3.  Transaminitis with AST 306, ALT 86, T. bili 2, lactic acid of 2.6. Trauma imaging which includes CT head, cervical spine and hip was negative for any acute bony abnormality.  CT cervical spine with a thyroid nodule of about 1 cm with no follow-up imaging recommended.  CXR with no acute abnormality   EKG reviewed by me with sinus sinus rhythm and ST changes in anteroseptal leads.  No recent EKG to compare  with.  Patient was started on heparin infusion and cardiology was also consulted.  Echocardiogram ordered.  4/5: Vital stable, improving transaminitis and leukocytosis, CO2 of 19, hypokalemia and lactic acidosis resolved.  Echocardiogram with normal EF, grade 1 diastolic dysfunction and no regional wall motion abnormalities.  Troponin peaked at 8231 and now trending down.  CK improving, continuing IV fluid for another day.  4/6: Hemodynamically stable.  Heparin infusion and IV fluid has been discontinued.  CK continued to improved.  PT is recommending short-term rehab.  4/7: Remains stable.  Awaiting insurance authorization for SNF.  4/8: Remained hemodynamically stable, improving physical activity.  Obtained insurance authorization and patient is being discharged to rehab before going back to her independent living facility.  Patient was started on baby aspirin, lipid profile was normal and due to elevated CK and mild transaminitis statin was held.  Her cardiologist can start if needed.  Patient will continue on current medications and need to have a close follow-up with her providers for further recommendations.  Assessment and Plan: * NSTEMI (non-ST elevated myocardial infarction) (HCC) Denies any chest pain but initial troponin at 7500, EKG with ST changes in anteroseptal leads.  No recent EKG to compare with. No prior cardiac history.  Family only wants medical management.  Troponin peaked at 8231 and now started trending down.  Echocardiogram with normal EF, grade 1 diastolic dysfunction and no regional wall motion abnormalities Cardiology is on board-just medical management -Completed heparin infusion -Lipid panel normal-A1c of 5.2 -Continue to  monitor   Rhabdomyolysis Likely due to lying down for few hours.  CK at 8918>>5272>>1553 - Encourage p.o. hydration  Lactic acidosis Resolved Likely due to hypoperfusion.  UA not consistent with UTI Preliminary blood cultures  negative -Holding off on antibiotics   Hypokalemia Resolved with repletion.  Magnesium was normal -Continue to monitor  Transaminitis Likely due to shock liver with NSTEMI and hypoperfusion. Continue to improve - Continue to monitor - Holding to check acute hepatitis panel but if continue to trend up we can check.  Leukocytosis Likely reactive with NSTEMI, resolved -Continue to monitor -Holding antibiotics  Hypertension Patient takes losartan at home.  Currently blood pressure within goal -Restarting low-dose losartan  GERD (gastroesophageal reflux disease) - Takes Nexium at home -Continue with PPI  Hyperlipidemia - Lipid panel normal -Not on any statin at home -Hold statin for now due to rhabdomyolysis and transaminitis  Fall -PT and OT evaluation -recommending SNF  Consultants: Cardiology Procedures performed: None Disposition: Skilled nursing facility Diet recommendation:  Discharge Diet Orders (From admission, onward)     Start     Ordered   06/20/23 0000  Diet - low sodium heart healthy        06/20/23 1352           Cardiac diet DISCHARGE MEDICATION: Allergies as of 06/20/2023       Reactions   Alendronate Sodium    REACTION: GI        Medication List     STOP taking these medications    losartan-hydrochlorothiazide 50-12.5 MG tablet Commonly known as: HYZAAR       TAKE these medications    aspirin EC 81 MG tablet Take 1 tablet (81 mg total) by mouth daily. Swallow whole. Start taking on: June 21, 2023   esomeprazole 20 MG capsule Commonly known as: NEXIUM Take 20 mg by mouth daily at 12 noon.   losartan 25 MG tablet Commonly known as: COZAAR Take 1 tablet (25 mg total) by mouth daily. Start taking on: June 21, 2023   melatonin 5 MG Tabs Take 5 mg by mouth at bedtime as needed.   multivitamin capsule Take 1 capsule by mouth daily.   Naproxen Sodium 220 MG Caps Take 1 capsule by mouth 2 (two) times daily as needed.    OVER THE COUNTER MEDICATION Curcumin 2K Take 1 Daily   polyethylene glycol 17 g packet Commonly known as: MIRALAX / GLYCOLAX Take 17 g by mouth daily as needed for mild constipation.   prednisoLONE acetate 1 % ophthalmic suspension Commonly known as: PRED FORTE Place 1 drop into both eyes at bedtime.   True Metrix Blood Glucose Test test strip Generic drug: glucose blood Use to check blood sugar once daily   TRUEplus Lancets 33G Misc Use to obtain blood sugar sample once daily   UNABLE TO FIND Med Name: CBD Oil        Follow-up Information     Karie Schwalbe, MD. Schedule an appointment as soon as possible for a visit in 1 week(s).   Specialties: Internal Medicine, Pediatrics Contact information: [address] [place] [address] [phone]         Debbe Odea, MD. Schedule an appointment as soon as possible for a visit in 1 week(s).   Specialties: Cardiology, Radiology Contact information: [address] [place] [address] [phone]                Discharge Exam: Ceasar Mons Weights   06/16/23  1345 06/16/23 1646 06/17/23 0514  Weight: 65.1 kg 65.1 kg 55.9 kg   General.  Frail but pleasant elderly lady, in no acute distress. Pulmonary.  Lungs clear bilaterally, normal respiratory effort. CV.  Regular rate and rhythm, no JVD, rub or murmur. Abdomen.  Soft, nontender, nondistended, BS positive. CNS.  Alert and oriented .  No focal neurologic deficit. Extremities.  No edema, no cyanosis, pulses intact and symmetrical. Psychiatry.  Judgment and insight appears normal.   Condition at discharge: stable  The results of significant diagnostics from this hospitalization (including imaging, microbiology, ancillary and laboratory) are listed below for reference.   Imaging Studies: ECHOCARDIOGRAM COMPLETE Result Date: 06/17/2023    ECHOCARDIOGRAM REPORT   [name] Date of Exam: 06/17/2023 Medical Rec #:   [id number]      Height:       58.0 in Accession #:    [id number]     Weight:       123.2 lb Date of Birth:  Feb 21, 1924      BSA:          1.482 m Patient Age:    89 years       BP:           123/54 mmHg Patient Gender: F              HR:           79 bpm. Exam Location:  [facility] Procedure: 2D Echo, Strain Analysis, Color Doppler and Cardiac Doppler (Both            Spectral and Color Flow Doppler were utilized during procedure). Indications:     Abnormal ECG R94.31  History:         Patient has no prior history of Echocardiogram examinations.  Sonographer:     Overton Mam RDCS, FASE Referring Phys:  [id number] Chenell Lozon Diagnosing Phys: Debbe Odea MD IMPRESSIONS  1. Left ventricular ejection fraction, by estimation, is 60 to 65%. The left ventricle has normal function. The left ventricle has no regional wall motion abnormalities. There is mild left ventricular hypertrophy. Left ventricular diastolic parameters are consistent with Grade I diastolic dysfunction (impaired relaxation).  2. Right ventricular systolic function is normal. The right ventricular size is normal.  3. Left atrial size was mildly dilated.  4. The mitral valve is normal in structure. Mild mitral valve regurgitation.  5. The aortic valve is tricuspid. Aortic valve regurgitation is not visualized. Aortic valve sclerosis is present, with no evidence of aortic valve stenosis.  6. The inferior vena cava is normal in size with greater than 50% respiratory variability, suggesting right atrial pressure of 3 mmHg. FINDINGS  Left Ventricle: Left ventricular ejection fraction, by estimation, is 60 to 65%. The left ventricle has normal function. The left ventricle has no regional wall motion abnormalities. Global longitudinal strain performed but not reported based on interpreter judgement due to suboptimal tracking. The left ventricular internal cavity size was normal in size. There is mild left ventricular hypertrophy. Left ventricular diastolic  parameters are consistent with Grade I diastolic dysfunction (impaired relaxation). Right Ventricle: The right ventricular size is normal. No increase in right ventricular wall thickness. Right ventricular systolic function is normal. Left Atrium: Left atrial size was mildly dilated. Right Atrium: Right atrial size was normal in size. Pericardium: There is no evidence of pericardial effusion. Mitral Valve: The mitral valve is normal in structure. Mild mitral valve regurgitation. Tricuspid Valve: The tricuspid valve is  normal in structure. Tricuspid valve regurgitation is mild. Aortic Valve: The aortic valve is tricuspid. Aortic valve regurgitation is not visualized. Aortic valve sclerosis is present, with no evidence of aortic valve stenosis. Aortic valve peak gradient measures 8.4 mmHg. Pulmonic Valve: The pulmonic valve was normal in structure. Pulmonic valve regurgitation is trivial. Aorta: The aortic root and ascending aorta are structurally normal, with no evidence of dilitation. Venous: The inferior vena cava is normal in size with greater than 50% respiratory variability, suggesting right atrial pressure of 3 mmHg. IAS/Shunts: No atrial level shunt detected by color flow Doppler.  LEFT VENTRICLE PLAX 2D LVIDd:         3.80 cm     Diastology LVIDs:         1.70 cm     LV e' medial:    4.03 cm/s LV PW:         1.20 cm     LV E/e' medial:  16.4 LV IVS:        0.90 cm     LV e' lateral:   3.81 cm/s LVOT diam:     2.00 cm     LV E/e' lateral: 17.3 LV SV:         58 LV SV Index:   39 LVOT Area:     3.14 cm  LV Volumes (MOD) LV vol d, MOD A4C: 57.3 ml LV vol s, MOD A4C: 17.5 ml LV SV MOD A4C:     57.3 ml RIGHT VENTRICLE RV Basal diam:  2.60 cm RV S prime:     17.30 cm/s TAPSE (M-mode): 1.9 cm LEFT ATRIUM           Index        RIGHT ATRIUM           Index LA diam:      2.90 cm 1.96 cm/m   RA Area:     12.80 cm LA Vol (A2C): 25.3 ml 17.07 ml/m  RA Volume:   29.10 ml  19.63 ml/m LA Vol (A4C): 50.8 ml 34.27 ml/m   AORTIC VALVE                 PULMONIC VALVE AV Area (Vmax): 2.51 cm     PV Vmax:          1.35 m/s AV Vmax:        145.00 cm/s  PV Peak grad:     7.3 mmHg AV Peak Grad:   8.4 mmHg     PR End Diast Vel: 5.66 msec LVOT Vmax:      116.00 cm/s  RVOT Peak grad:   8 mmHg LVOT Vmean:     82.400 cm/s LVOT VTI:       0.185 m  AORTA Ao Root diam: 2.90 cm Ao Asc diam:  2.80 cm MITRAL VALVE                TRICUSPID VALVE MV Area (PHT): 4.68 cm     TR Peak grad:   26.6 mmHg MV Decel Time: 162 msec     TR Vmax:        258.00 cm/s MV E velocity: 66.10 cm/s MV A velocity: 106.00 cm/s  SHUNTS MV E/A ratio:  0.62         Systemic VTI:  0.18 m                             Systemic  Diam: 2.00 cm Debbe Odea MD Electronically signed by Debbe Odea MD Signature Date/Time: 06/17/2023/9:46:34 AM    Final    CT Cervical Spine Wo Contrast Result Date: 06/16/2023 CLINICAL DATA:  Facial trauma. EXAM: CT CERVICAL SPINE WITHOUT CONTRAST TECHNIQUE: Multidetector CT imaging of the cervical spine was performed without intravenous contrast. Multiplanar CT image reconstructions were also generated. RADIATION DOSE REDUCTION: This exam was performed according to the departmental dose-optimization program which includes automated exposure control, adjustment of the mA and/or kV according to patient size and/or use of iterative reconstruction technique. COMPARISON:  None Available. FINDINGS: Alignment: Normal. Skull base and vertebrae: No acute fracture. No primary bone lesion or focal pathologic process. Soft tissues and spinal canal: No prevertebral fluid or swelling. No visible canal hematoma. Disc levels: There is disc space narrowing and endplate osteophyte formation throughout the cervical spine most significant at C5-C6 and C6-C7. No significant central canal or neural foraminal stenosis at any level. Upper chest: Negative. Other: Bilateral thyroid nodules are present measuring up to 1 cm. IMPRESSION: Impression 1. No acute fracture  or traumatic subluxation of the cervical spine. 2. Mild degenerative changes. 3. Thyroid nodules measuring up to 1 cm. No follow-up imaging recommended. (Ref: J Am Coll Radiol. 2015 Feb;12(2): 143-50). Electronically Signed   By: Darliss Cheney M.D.   On: 06/16/2023 17:20   CT Head Wo Contrast Result Date: 06/16/2023 CLINICAL DATA:  Trauma. EXAM: CT HEAD WITHOUT CONTRAST TECHNIQUE: Contiguous axial images were obtained from the base of the skull through the vertex without intravenous contrast. RADIATION DOSE REDUCTION: This exam was performed according to the departmental dose-optimization program which includes automated exposure control, adjustment of the mA and/or kV according to patient size and/or use of iterative reconstruction technique. COMPARISON:  None Available. FINDINGS: Brain: No evidence of acute infarction, hemorrhage, hydrocephalus, extra-axial collection or mass lesion/mass effect. There is mild periventricular white matter hypodensity, likely chronic small vessel ischemic change. Vascular: No hyperdense vessel or unexpected calcification. Skull: Normal. Negative for fracture or focal lesion. Sinuses/Orbits: No acute finding. Other: None. IMPRESSION: No acute intracranial abnormality. Electronically Signed   By: Darliss Cheney M.D.   On: 06/16/2023 16:41   DG Chest Port 1 View Result Date: 06/16/2023 CLINICAL DATA:  Questionable sepsis. EXAM: PORTABLE CHEST 1 VIEW COMPARISON:  None available. FINDINGS: Shallow inspiration. No focal consolidation or pneumothorax. Trace pleural effusions may be present. The cardiac silhouette is within limits. No acute osseous pathology. IMPRESSION: No focal consolidation. Trace pleural effusions may be present. Electronically Signed   By: Elgie Collard M.D.   On: 06/16/2023 16:29   DG HIP UNILAT WITH PELVIS 2-3 VIEWS RIGHT Result Date: 06/16/2023 CLINICAL DATA:  Fall. EXAM: DG HIP (WITH OR WITHOUT PELVIS) 2-3V RIGHT COMPARISON:  None Available. FINDINGS:  Pelvis is intact with normal and symmetric sacroiliac joints. No acute fracture or dislocation. No aggressive osseous lesion. Visualized sacral arcuate lines are unremarkable. Unremarkable symphysis pubis. There are mild degenerative changes of bilateral hip joints without significant joint space narrowing. Osteophytosis of the superior acetabulum. No radiopaque foreign bodies. IMPRESSION: No acute osseous abnormality of the pelvis or right hip joint. Electronically Signed   By: Jules Schick M.D.   On: 06/16/2023 16:29    Microbiology: Results for orders placed or performed during the hospital encounter of 06/16/23  Resp panel by RT-PCR (RSV, Flu A&B, Covid) Anterior Nasal Swab     Status: None   Collection Time: 06/16/23  1:48 PM   Specimen: Anterior Nasal Swab  Result Value Ref Range Status   SARS Coronavirus 2 by RT PCR NEGATIVE NEGATIVE Final    Comment: (NOTE) SARS-CoV-2 target nucleic acids are NOT DETECTED.  The SARS-CoV-2 RNA is generally detectable in upper respiratory specimens during the acute phase of infection. The lowest concentration of SARS-CoV-2 viral copies this assay can detect is 138 copies/mL. A negative result does not preclude SARS-Cov-2 infection and should not be used as the sole basis for treatment or other patient management decisions. A negative result may occur with  improper specimen collection/handling, submission of specimen other than nasopharyngeal swab, presence of viral mutation(s) within the areas targeted by this assay, and inadequate number of viral copies(<138 copies/mL). A negative result must be combined with clinical observations, patient history, and epidemiological information. The expected result is Negative.  Fact Sheet for Patients:  BloggerCourse.com  Fact Sheet for Healthcare Providers:  SeriousBroker.it  This test is no t yet approved or cleared by the [place] FDA and  has been  authorized for detection and/or diagnosis of SARS-CoV-2 by FDA under an Emergency Use Authorization (EUA). This EUA will remain  in effect (meaning this test can be used) for the duration of the COVID-19 declaration under Section 564(b)(1) of the Act, 21 U.S.C.section 360bbb-3(b)(1), unless the authorization is terminated  or revoked sooner.       Influenza A by PCR NEGATIVE NEGATIVE Final   Influenza B by PCR NEGATIVE NEGATIVE Final    Comment: (NOTE) The Xpert Xpress SARS-CoV-2/FLU/RSV plus assay is intended as an aid in the diagnosis of influenza from Nasopharyngeal swab specimens and should not be used as a sole basis for treatment. Nasal washings and aspirates are unacceptable for Xpert Xpress SARS-CoV-2/FLU/RSV testing.  Fact Sheet for Patients: BloggerCourse.com  Fact Sheet for Healthcare Providers: SeriousBroker.it  This test is not yet approved or cleared by the [place] FDA and has been authorized for detection and/or diagnosis of SARS-CoV-2 by FDA under an Emergency Use Authorization (EUA). This EUA will remain in effect (meaning this test can be used) for the duration of the COVID-19 declaration under Section 564(b)(1) of the Act, 21 U.S.C. section 360bbb-3(b)(1), unless the authorization is terminated or revoked.     Resp Syncytial Virus by PCR NEGATIVE NEGATIVE Final    Comment: (NOTE) Fact Sheet for Patients: BloggerCourse.com  Fact Sheet for Healthcare Providers: SeriousBroker.it  This test is not yet approved or cleared by the [place] FDA and has been authorized for detection and/or diagnosis of SARS-CoV-2 by FDA under an Emergency Use Authorization (EUA). This EUA will remain in effect (meaning this test can be used) for the duration of the COVID-19 declaration under Section 564(b)(1) of the Act, 21 U.S.C. section 360bbb-3(b)(1), unless the  authorization is terminated or revoked.  Performed at [facility], [address] [place], [place] [address]   Blood Culture (routine x 2)     Status: None (Preliminary result)   Collection Time: 06/16/23  4:30 PM   Specimen: BLOOD  Result Value Ref Range Status   Specimen Description BLOOD BLOOD RIGHT ARM  Final   Special Requests   Final    BOTTLES DRAWN AEROBIC AND ANAEROBIC Blood Culture results may not be optimal due to an inadequate volume of blood received in culture bottles   Culture   Final    NO GROWTH 4 DAYS Performed at [facility], [address], [place], [place] [address]    Report Status PENDING  Incomplete  Blood Culture (routine x  2)     Status: None (Preliminary result)   Collection Time: 06/16/23  4:30 PM   Specimen: BLOOD  Result Value Ref Range Status   Specimen Description BLOOD BLOOD LEFT ARM  Final   Special Requests   Final    BOTTLES DRAWN AEROBIC AND ANAEROBIC Blood Culture results may not be optimal due to an inadequate volume of blood received in culture bottles   Culture   Final    NO GROWTH 4 DAYS Performed at [facility], [address] [place], [place] [address]    Report Status PENDING  Incomplete    Labs: CBC: Recent Labs  Lab 06/16/23 1447 06/17/23 0242 06/18/23 0542 06/18/23 1409  WBC 18.2* 17.1* 9.8 10.9*  NEUTROABS 16.3*  --   --   --   HGB 14.5 13.1 10.8* 11.7*  HCT 40.0 36.4 30.5* 32.5*  MCV 87.9 89.7 89.7 89.3  PLT 192 174 139* 140*   Basic Metabolic Panel: Recent Labs  Lab 06/16/23 1447 06/17/23 0242 06/18/23 0542  NA 136 136 134*  K 2.8* 3.9 4.4  CL 100 104 106  CO2 22 19* 25  GLUCOSE 112* 92 107*  BUN 23 27* 23  CREATININE 0.89 0.92 0.77  CALCIUM 9.7 9.5 8.9  MG 2.0  --   --    Liver Function Tests: Recent Labs  Lab 06/16/23 1447 06/17/23 0242 06/18/23 0542  AST 306* 235* 135*  ALT 86* 74* 61*  ALKPHOS 40 39 35*  BILITOT 2.0* 1.4* 0.9  PROT 6.4* 5.8* 4.9*  ALBUMIN  3.8 3.3* 2.8*   CBG: No results for input(s): "GLUCAP" in the last 168 hours.  Discharge time spent: greater than 30 minutes.  This record has been created using Conservation officer, historic buildings. Errors have been sought and corrected,but may not always be located. Such creation errors do not reflect on the standard of care.   Signed: Arnetha Courser, MD [facility] 06/20/2023

## 2023-06-20 NOTE — Plan of Care (Signed)
 Plan of care was reviewed. Pt has been progressing. She is alert and fully oriented x 4, afebrile, stable hemodynamically. She is able to ambulate to the bathroom with walker and a staff standby assisted. No acute distress overnight. We will monitor.  Problem: Clinical Measurements: Goal: Ability to maintain clinical measurements within normal limits will improve Outcome: Progressing Goal: Will remain free from infection Outcome: Progressing Goal: Diagnostic test results will improve Outcome: Progressing Goal: Respiratory complications will improve Outcome: Progressing Goal: Cardiovascular complication will be avoided Outcome: Progressing   Problem: Health Behavior/Discharge Planning: Goal: Ability to manage health-related needs will improve Outcome: Progressing   Problem: Activity: Goal: Risk for activity intolerance will decrease Outcome: Progressing   Problem: Nutrition: Goal: Adequate nutrition will be maintained Outcome: Progressing   Problem: Elimination: Goal: Will not experience complications related to bowel motility Outcome: Progressing Goal: Will not experience complications related to urinary retention Outcome: Progressing   Problem: Pain Managment: Goal: General experience of comfort will improve and/or be controlled Outcome: Progressing   Problem: Safety: Goal: Ability to remain free from injury will improve Outcome: Progressing   Problem: Skin Integrity: Goal: Risk for impaired skin integrity will decrease Outcome: Progressing   Filiberto Pinks, RN

## 2023-06-20 NOTE — TOC Transition Note (Signed)
 Transition of Care [facility]) - Discharge Note   Patient Details  Name: ILZE ROSELLI MRN: [id number] Date of Birth: [birth year]  Transition of Care [facility]) CM/SW Contact:  Truddie Hidden, RN Phone Number: 06/20/2023, 3:28 PM   Clinical Narrative:    Sherron Monday with Sue Lush in admissions at [facility]  Per facility patient admission confirmed for today. Patient assigned room # 115 Nurse will call report to [phone] Discharge summary and FL2 sent in the HUB  Nurse, and family notified spoke with her daughter, Richarda Osmond. She will transport patient.  TOC signing off.    Final next level of care: Skilled Nursing Facility Barriers to Discharge: Barriers Resolved   Patient Goals and CMS Choice Patient states their goals for this hospitalization and ongoing recovery are:: SNF          Discharge Placement                  Name of family member notified: Mollie Patient and family notified of of transfer: 06/20/23  Discharge Plan and Services Additional resources added to the After Visit Summary for                                       Social Drivers of Health (SDOH) Interventions SDOH Screenings   Food Insecurity: No Food Insecurity (06/17/2023)  Housing: Low Risk  (06/17/2023)  Transportation Needs: No Transportation Needs (06/17/2023)  Utilities: Not At Risk (06/17/2023)  Depression (PHQ2-9): Low Risk  (01/06/2023)  Social Connections: Unknown (06/17/2023)  Tobacco Use: Low Risk  (01/06/2023)     Readmission Risk Interventions     No data to display

## 2023-06-21 ENCOUNTER — Non-Acute Institutional Stay (SKILLED_NURSING_FACILITY): Payer: Self-pay | Admitting: Student

## 2023-06-21 ENCOUNTER — Encounter: Payer: Self-pay | Admitting: Student

## 2023-06-21 DIAGNOSIS — Z7189 Other specified counseling: Secondary | ICD-10-CM | POA: Diagnosis not present

## 2023-06-21 DIAGNOSIS — R410 Disorientation, unspecified: Secondary | ICD-10-CM | POA: Diagnosis not present

## 2023-06-21 DIAGNOSIS — I10 Essential (primary) hypertension: Secondary | ICD-10-CM

## 2023-06-21 DIAGNOSIS — M159 Polyosteoarthritis, unspecified: Secondary | ICD-10-CM

## 2023-06-21 DIAGNOSIS — I252 Old myocardial infarction: Secondary | ICD-10-CM

## 2023-06-21 DIAGNOSIS — E876 Hypokalemia: Secondary | ICD-10-CM

## 2023-06-21 DIAGNOSIS — W19XXXD Unspecified fall, subsequent encounter: Secondary | ICD-10-CM

## 2023-06-21 DIAGNOSIS — M6282 Rhabdomyolysis: Secondary | ICD-10-CM

## 2023-06-21 LAB — CULTURE, BLOOD (ROUTINE X 2)
Culture: NO GROWTH
Culture: NO GROWTH

## 2023-06-21 NOTE — Progress Notes (Signed)
 Provider:  Dr. Earnestine Mealing Location:  Other [place].  Nursing Home Room Number: [facility] 115A Place of Service:  SNF (31)  PCP: Karie Schwalbe, MD Patient Care Team: Karie Schwalbe, MD as PCP - General  Extended Emergency Contact Information Primary Emergency Contact: Rhea Belton [place] Home Phone: [phone] Relation: Daughter Secondary Emergency Contact: Anette Guarneri [place] Home Phone: [phone] Relation: Niece  Code Status: DNR Goals of Care: Advanced Directive information    06/21/2023    9:03 AM  Advanced Directives  Does Patient Have a Medical Advance Directive? Yes  Type of Advance Directive Out of facility DNR (pink MOST or yellow form)  Does patient want to make changes to medical advance directive? No - Patient declined    Chief Complaint  Patient presents with   New Admit To SNF    New Admission.     HPI: Patient is a 89 y.o. female seen today for admission to [facility]  History of Present Illness The patient is a 89 year old with hypertension who presents with a recent fall and hospitalization. She is accompanied by her daughter and cousin. She was referred by Dr. Alphonsus Sias for follow-up after a recent hospitalization. She is being admitted to [facility].   She recently experienced a fall while gardening, which led to her hospitalization. She was found face down by a neighbor after her daughter was unable to reach her by phone. At the time of the fall, she was not wearing her life alert bracelet to avoid getting it dirty. She typically uses a rollator for mobility and is usually cautious about falls.  During her hospital stay, she was noted to have low blood pressure and very low potassium levels. Initially, her blood pressure was high at 176 mmHg but stabilized to around 115-130 mmHg during her stay. Her medication regimen was adjusted by discontinuing hydrochlorothiazide and continuing  losartan.  She experiences no dizziness or headaches but feels 'loose ends' and not her usual self. She did not experience confusion about days and nights while hospitalized but had some confusion after the fall, which was noted in the emergency room. She denies having dementia, although there was some confusion noted post-trauma.  Her current medications include losartan, Aleve, a multivitamin, and melatonin. She has been taking Aleve PM at night for sleep. She manages her medications independently at home. She has a history of taking Advil PM, but currently, she is not using it. She also takes Nexium for acid reflux.  Her family history includes a twin sister who had dementia and a daughter who passed away from a heart attack.  Social History - Occasional wine drinker  Family History - Patient's twin sister had dementia, deceased  Past Medical History:  Diagnosis Date   Diverticulosis    GERD (gastroesophageal reflux disease)    Hyperlipidemia    Hypertension    Osteoporosis    Polymyalgia rheumatica (HCC)    Wears dentures    partial upper and lower   Wears hearing aid in both ears    Past Surgical History:  Procedure Laterality Date   ANKLE FRACTURE SURGERY  07/12/2005   CATARACT EXTRACTION  2012   right   CATARACT EXTRACTION W/PHACO Left 09/07/2020   Procedure: CATARACT EXTRACTION PHACO AND INTRAOCULAR LENS PLACEMENT (IOC) LEFT 8.30 01:09.7;  Surgeon: Lockie Mola, MD;  Location: [facility];  Service: Ophthalmology;  Laterality: Left;  requests early as possible   TONSILLECTOMY  reports that she has never smoked. She has never been exposed to tobacco smoke. She has never used smokeless tobacco. She reports current alcohol use. She reports that she does not use drugs. Social History   Socioeconomic History   Marital status: Widowed    Spouse name: Not on file   Number of children: 2   Years of education: Not on file   Highest education level: Not on  file  Occupational History   Occupation: House wife, farming  Tobacco Use   Smoking status: Never    Passive exposure: Never   Smokeless tobacco: Never  Vaping Use   Vaping status: Never Used  Substance and Sexual Activity   Alcohol use: Yes    Alcohol/week: 0.0 standard drinks of alcohol    Comment: wine   Drug use: No   Sexual activity: Not on file  Other Topics Concern   Not on file  Social History Narrative   Has living will   1 living daughter is  health care POA.     Has DNR already   Request no feeding tube.  Does not want extended ventilator support.   Social Drivers of Corporate investment banker Strain: Not on file  Food Insecurity: No Food Insecurity (06/17/2023)   Hunger Vital Sign    Worried About Running Out of Food in the Last Year: Never true    Ran Out of Food in the Last Year: Never true  Transportation Needs: No Transportation Needs (06/17/2023)   PRAPARE - Administrator, Civil Service (Medical): No    Lack of Transportation (Non-Medical): No  Physical Activity: Not on file  Stress: Not on file  Social Connections: Unknown (06/17/2023)   Social Connection and Isolation Panel [NHANES]    Frequency of Communication with Friends and Family: More than three times a week    Frequency of Social Gatherings with Friends and Family: More than three times a week    Attends Religious Services: Patient unable to answer    Active Member of Clubs or Organizations: Patient unable to answer    Attends Banker Meetings: Patient unable to answer    Marital Status: Married  Catering manager Violence: Not At Risk (06/17/2023)   Humiliation, Afraid, Rape, and Kick questionnaire    Fear of Current or Ex-Partner: No    Emotionally Abused: No    Physically Abused: No    Sexually Abused: No    Functional Status Survey:    Family History  Problem Relation Age of Onset   Arthritis Mother    Heart disease Father 72   Diabetes Brother    Cancer  Sister        ovarian   Stroke Sister    Heart disease Daughter    Rheum arthritis Daughter     Health Maintenance  Topic Date Due   Zoster Vaccines- Shingrix (1 of 2) 01/20/1943   DTaP/Tdap/Td (2 - Tdap) 07/08/2012   COVID-19 Vaccine (3 - Moderna risk series) 08/23/2020   INFLUENZA VACCINE  10/13/2023   Medicare Annual Wellness (AWV)  01/06/2024   Pneumonia Vaccine 1+ Years old  Completed   DEXA SCAN  Completed   HPV VACCINES  Aged Out    Allergies  Allergen Reactions   Alendronate Sodium     REACTION: GI    Outpatient Encounter Medications as of 06/21/2023  Medication Sig   aspirin EC 81 MG tablet Take 1 tablet (81 mg total) by mouth daily. Swallow whole.  esomeprazole (NEXIUM) 20 MG capsule Take 20 mg by mouth daily at 12 noon.   glucose blood (TRUE METRIX BLOOD GLUCOSE TEST) test strip Use to check blood sugar once daily   losartan (COZAAR) 25 MG tablet Take 1 tablet (25 mg total) by mouth daily.   melatonin 5 MG TABS Take 5 mg by mouth at bedtime as needed.   Multiple Vitamin (MULTIVITAMIN) capsule Take 1 capsule by mouth daily.   Naproxen Sodium 220 MG CAPS Take 1 capsule by mouth 2 (two) times daily as needed.   polyethylene glycol (MIRALAX / GLYCOLAX) 17 g packet Take 17 g by mouth daily as needed for mild constipation.   prednisoLONE acetate (PRED FORTE) 1 % ophthalmic suspension Place 1 drop into both eyes at bedtime.   TRUEplus Lancets 33G MISC Use to obtain blood sugar sample once daily   OVER THE COUNTER MEDICATION Curcumin 2K Take 1 Daily (Patient not taking: Reported on 06/21/2023)   UNABLE TO FIND Med Name: CBD Oil (Patient not taking: Reported on 06/21/2023)   No facility-administered encounter medications on file as of 06/21/2023.    Review of Systems  Vitals:   06/21/23 0857  BP: 136/71  Pulse: 86  Resp: 20  Temp: (!) 97.2 F (36.2 C)  SpO2: 94%  Weight: 125 lb 9.6 oz (57 kg)  Height: 4\' 9"  (1.448 m)   Body mass index is 27.18 kg/m. Physical  Exam Constitutional:      Comments: Thin, frail, in wheelchair  Cardiovascular:     Rate and Rhythm: Normal rate.     Pulses: Normal pulses.  Pulmonary:     Effort: Pulmonary effort is normal.  Abdominal:     General: Abdomen is flat.     Palpations: Abdomen is soft.  Skin:    General: Skin is warm.  Neurological:     General: No focal deficit present.     Labs reviewed: Basic Metabolic Panel: Recent Labs    06/16/23 1447 06/17/23 0242 06/18/23 0542  NA 136 136 134*  K 2.8* 3.9 4.4  CL 100 104 106  CO2 22 19* 25  GLUCOSE 112* 92 107*  BUN 23 27* 23  CREATININE 0.89 0.92 0.77  CALCIUM 9.7 9.5 8.9  MG 2.0  --   --    Liver Function Tests: Recent Labs    06/16/23 1447 06/17/23 0242 06/18/23 0542  AST 306* 235* 135*  ALT 86* 74* 61*  ALKPHOS 40 39 35*  BILITOT 2.0* 1.4* 0.9  PROT 6.4* 5.8* 4.9*  ALBUMIN 3.8 3.3* 2.8*   No results for input(s): "LIPASE", "AMYLASE" in the last 8760 hours. No results for input(s): "AMMONIA" in the last 8760 hours. CBC: Recent Labs    06/16/23 1447 06/17/23 0242 06/18/23 0542 06/18/23 1409  WBC 18.2* 17.1* 9.8 10.9*  NEUTROABS 16.3*  --   --   --   HGB 14.5 13.1 10.8* 11.7*  HCT 40.0 36.4 30.5* 32.5*  MCV 87.9 89.7 89.7 89.3  PLT 192 174 139* 140*   Cardiac Enzymes: Recent Labs    06/16/23 1447 06/17/23 0242 06/18/23 0542  CKTOTAL 8,918* 5,272* 1,553*   BNP: Invalid input(s): "POCBNP" Lab Results  Component Value Date   HGBA1C 5.2 06/16/2023   Lab Results  Component Value Date   TSH 1.14 04/26/2013   Lab Results  Component Value Date   VITAMINB12 598 12/09/2009   Lab Results  Component Value Date   FOLATE >20.0 ng/mL 12/09/2009   Lab Results  Component  Value Date   IRON 88 01/13/2010   FERRITIN 15.1 01/13/2010    Imaging and Procedures obtained prior to SNF admission: ECHOCARDIOGRAM COMPLETE Result Date: 06/17/2023    ECHOCARDIOGRAM REPORT   Patient Name:   AIBHLINN KALMAR Date of Exam: 06/17/2023  Medical Rec #:  [id number]      Height:       58.0 in Accession #:    [id number]     Weight:       123.2 lb Date of Birth:  31-Jan-1924      BSA:          1.482 m Patient Age:    89 years       BP:           123/54 mmHg Patient Gender: F              HR:           79 bpm. Exam Location:  [facility] Procedure: 2D Echo, Strain Analysis, Color Doppler and Cardiac Doppler (Both            Spectral and Color Flow Doppler were utilized during procedure). Indications:     Abnormal ECG R94.31  History:         Patient has no prior history of Echocardiogram examinations.  Sonographer:     Overton Mam RDCS, FASE Referring Phys:  [id number] AMIN Diagnosing Phys: Debbe Odea MD IMPRESSIONS  1. Left ventricular ejection fraction, by estimation, is 60 to 65%. The left ventricle has normal function. The left ventricle has no regional wall motion abnormalities. There is mild left ventricular hypertrophy. Left ventricular diastolic parameters are consistent with Grade I diastolic dysfunction (impaired relaxation).  2. Right ventricular systolic function is normal. The right ventricular size is normal.  3. Left atrial size was mildly dilated.  4. The mitral valve is normal in structure. Mild mitral valve regurgitation.  5. The aortic valve is tricuspid. Aortic valve regurgitation is not visualized. Aortic valve sclerosis is present, with no evidence of aortic valve stenosis.  6. The inferior vena cava is normal in size with greater than 50% respiratory variability, suggesting right atrial pressure of 3 mmHg. FINDINGS  Left Ventricle: Left ventricular ejection fraction, by estimation, is 60 to 65%. The left ventricle has normal function. The left ventricle has no regional wall motion abnormalities. Global longitudinal strain performed but not reported based on interpreter judgement due to suboptimal tracking. The left ventricular internal cavity size was normal in size. There is mild left ventricular hypertrophy. Left  ventricular diastolic parameters are consistent with Grade I diastolic dysfunction (impaired relaxation). Right Ventricle: The right ventricular size is normal. No increase in right ventricular wall thickness. Right ventricular systolic function is normal. Left Atrium: Left atrial size was mildly dilated. Right Atrium: Right atrial size was normal in size. Pericardium: There is no evidence of pericardial effusion. Mitral Valve: The mitral valve is normal in structure. Mild mitral valve regurgitation. Tricuspid Valve: The tricuspid valve is normal in structure. Tricuspid valve regurgitation is mild. Aortic Valve: The aortic valve is tricuspid. Aortic valve regurgitation is not visualized. Aortic valve sclerosis is present, with no evidence of aortic valve stenosis. Aortic valve peak gradient measures 8.4 mmHg. Pulmonic Valve: The pulmonic valve was normal in structure. Pulmonic valve regurgitation is trivial. Aorta: The aortic root and ascending aorta are structurally normal, with no evidence of dilitation. Venous: The inferior vena cava is normal in size with greater than 50% respiratory variability, suggesting right atrial  pressure of 3 mmHg. IAS/Shunts: No atrial level shunt detected by color flow Doppler.  LEFT VENTRICLE PLAX 2D LVIDd:         3.80 cm     Diastology LVIDs:         1.70 cm     LV e' medial:    4.03 cm/s LV PW:         1.20 cm     LV E/e' medial:  16.4 LV IVS:        0.90 cm     LV e' lateral:   3.81 cm/s LVOT diam:     2.00 cm     LV E/e' lateral: 17.3 LV SV:         58 LV SV Index:   39 LVOT Area:     3.14 cm  LV Volumes (MOD) LV vol d, MOD A4C: 57.3 ml LV vol s, MOD A4C: 17.5 ml LV SV MOD A4C:     57.3 ml RIGHT VENTRICLE RV Basal diam:  2.60 cm RV S prime:     17.30 cm/s TAPSE (M-mode): 1.9 cm LEFT ATRIUM           Index        RIGHT ATRIUM           Index LA diam:      2.90 cm 1.96 cm/m   RA Area:     12.80 cm LA Vol (A2C): 25.3 ml 17.07 ml/m  RA Volume:   29.10 ml  19.63 ml/m LA Vol  (A4C): 50.8 ml 34.27 ml/m  AORTIC VALVE                 PULMONIC VALVE AV Area (Vmax): 2.51 cm     PV Vmax:          1.35 m/s AV Vmax:        145.00 cm/s  PV Peak grad:     7.3 mmHg AV Peak Grad:   8.4 mmHg     PR End Diast Vel: 5.66 msec LVOT Vmax:      116.00 cm/s  RVOT Peak grad:   8 mmHg LVOT Vmean:     82.400 cm/s LVOT VTI:       0.185 m  AORTA Ao Root diam: 2.90 cm Ao Asc diam:  2.80 cm MITRAL VALVE                TRICUSPID VALVE MV Area (PHT): 4.68 cm     TR Peak grad:   26.6 mmHg MV Decel Time: 162 msec     TR Vmax:        258.00 cm/s MV E velocity: 66.10 cm/s MV A velocity: 106.00 cm/s  SHUNTS MV E/A ratio:  0.62         Systemic VTI:  0.18 m                             Systemic Diam: 2.00 cm Debbe Odea MD Electronically signed by Debbe Odea MD Signature Date/Time: 06/17/2023/9:46:34 AM    Final     Assessment/Plan Hypokalemia Hypokalemia occurred during a recent hospitalization, likely due to hydrochlorothiazide (HCTZ) use, which can cause potassium loss. Losartan, which typically helps retain potassium, was insufficient to balance levels, possibly due to hydration status or other factors. Hypokalemia contributed to weakness and a fall. Discontinuing HCTZ is advised to prevent further potassium loss. - Discontinue hydrochlorothiazide to prevent further potassium loss -  Monitor blood pressure to determine if hydrochlorothiazide needs to be reintroduced - Educate on the importance of maintaining potassium levels and the role of medications  Hypertension Blood pressure was initially 176 mmHg systolic upon hospital admission but has stabilized to around 117 mmHg systolic with the discontinuation of HCTZ and continuation of losartan. The goal is to maintain blood pressure between 120-140 mmHg systolic and 70-80 mmHg diastolic to minimize fall risk. Blood pressures less than 120 mmHg systolic are associated with a slightly higher risk of falls. - Continue losartan for blood pressure  management - Monitor blood pressure regularly to ensure it remains within the target range - Consider reintroducing hydrochlorothiazide if blood pressure consistently exceeds 140 mmHg systolic  Fall  Rhabdomyolysis  A fall occurred while gardening, likely due to hypokalemia-induced weakness and possibly low blood pressure. She is at increased risk of falls due to age and recent hospitalization. Blood pressure management is crucial to minimize fall risk, with a target range of 120-140 mmHg systolic and 70-80 mmHg diastolic. - Encourage use of rollator for mobility to prevent falls - Advise against driving until occupational therapy assesses driving safety - Engage in physical therapy to improve strength and mobility - Ensure blood pressure is maintained within the target range to reduce fall risk  Delirium Confusion and memory issues during the hospital stay were likely due to hospital-acquired delirium, distinct from dementia, as she has been managing independently prior to the incident. Delirium can mimic dementia but is reversible. - Monitor cognitive function and reassess if confusion persists - Educate family on the difference between delirium and dementia  Pain Management She has been using naproxen for pain management. There is a concern about the risk of gastrointestinal bleeding when combining naproxen with aspirin, especially in individuals over 45 years old. The decision to use aspirin should consider the potential for NSTEMI and the associated risks of bleeding. - Discuss the risks of combining naproxen and aspirin with her - Consider using acetaminophen for pain management as it has a lower risk of GI bleeding - Continue esomeprazole to protect the stomach from the effects of naproxen  General Health Maintenance Adequate protein intake is crucial to prevent muscle loss, especially after hospitalization. She enjoys country ham and red wine, which are acceptable in moderation. A  recommendation of 30 grams of protein per meal is advised. - Ensure intake of 30 grams of protein per meal - Consult with a nutritionist to ensure adequate protein intake - Allow moderate consumption of red wine, up to 4 ounces  Goals of Care She has a DNR order in place and prefers to avoid hospitalization if possible, depending on the situation. She is interested in having home health aides for additional support. Her goals include returning to independent living and maintaining her current quality of life. Discussed concern for bleeding risk with both Naproxen and Aspirin. ASA was new this hospitalization after concern for NSTEMI. Patient is adamant about pain control with naproxen and would prefer discontinuation of ASA at this time.  - Respect DNR order and discuss hospitalization preferences with her - Arrange for home health aides to provide support as needed  Follow-up A follow-up appointment is scheduled for May 15th, but she may need to be seen sooner after discharge from the current facility to ensure proper recovery and management of conditions. Coordination with therapy is necessary to determine the appropriate timing for follow-up. - Schedule follow-up appointment within a week after discharge from the facility - Coordinate with therapy  to determine the appropriate timing for follow-up  Family/ staff Communication: Nursing, daughter, niece  Labs/tests ordered: CBC, BMP   I spent greater than 35  minutes for the care of this patient in face to face time, chart review, clinical documentation, patient education. I spent an additional 16 minutes discussing goals of care and advanced care planning.

## 2023-06-22 ENCOUNTER — Non-Acute Institutional Stay (SKILLED_NURSING_FACILITY): Payer: Self-pay | Admitting: Nurse Practitioner

## 2023-06-22 DIAGNOSIS — M25521 Pain in right elbow: Secondary | ICD-10-CM

## 2023-06-22 LAB — BASIC METABOLIC PANEL WITH GFR
BUN: 11 (ref 4–21)
CO2: 23 — AB (ref 13–22)
Chloride: 106 (ref 99–108)
Creatinine: 0.6 (ref 0.5–1.1)
Glucose: 95
Potassium: 3.7 meq/L (ref 3.5–5.1)
Sodium: 138 (ref 137–147)

## 2023-06-22 LAB — COMPREHENSIVE METABOLIC PANEL WITH GFR
Calcium: 9.1 (ref 8.7–10.7)
eGFR: 80

## 2023-06-22 LAB — CBC AND DIFFERENTIAL
HCT: 34 — AB (ref 36–46)
Hemoglobin: 11.4 — AB (ref 12.0–16.0)
Neutrophils Absolute: 3360
Platelets: 231 10*3/uL (ref 150–400)
WBC: 5.3

## 2023-06-22 LAB — CBC: RBC: 3.62 — AB (ref 3.87–5.11)

## 2023-06-22 NOTE — Progress Notes (Signed)
 Location:      Place of Service:     Helaine Llanos, MD  Patient Care Team: Helaine Llanos, MD as PCP - General  Extended Emergency Contact Information Primary Emergency Contact: Light,Mollie  [place] Home Phone: [phone] Relation: Daughter Secondary Emergency Contact: Nour,Martivonne  [place] Home Phone: [phone] Relation: Niece  Goals of care: Advanced Directive information    06/21/2023    9:03 AM  Advanced Directives  Does Patient Have a Medical Advance Directive? Yes  Type of Advance Directive Out of facility DNR (pink MOST or yellow form)  Does patient want to make changes to medical advance directive? No - Patient declined     Chief Complaint  Patient presents with   Acute Visit    Arm pain    HPI:  Pt is a 89 y.o. female seen today for an acute visit for right arm/elbow pain.  Pt is now in [place] after hospitalization after  a fall. Daughter with her today during visit.  She has been having increase in pain in her right forearm and elbow. She is unsure if this was evaluated during her hospitalization.  She prefers to take ibuprofen for pain vs tylenol.     Past Medical History:  Diagnosis Date   Diverticulosis    GERD (gastroesophageal reflux disease)    Hyperlipidemia    Hypertension    Osteoporosis    Polymyalgia rheumatica (HCC)    Wears dentures    partial upper and lower   Wears hearing aid in both ears    Past Surgical History:  Procedure Laterality Date   ANKLE FRACTURE SURGERY  07/12/2005   CATARACT EXTRACTION  2012   right   CATARACT EXTRACTION W/PHACO Left 09/07/2020   Procedure: CATARACT EXTRACTION PHACO AND INTRAOCULAR LENS PLACEMENT (IOC) LEFT 8.30 01:09.7;  Surgeon: Annell Kidney, MD;  Location: [facility];  Service: Ophthalmology;  Laterality: Left;  requests early as possible   TONSILLECTOMY      Allergies  Allergen Reactions   Alendronate Sodium      REACTION: GI    Outpatient Encounter Medications as of 06/22/2023  Medication Sig   esomeprazole (NEXIUM) 20 MG capsule Take 20 mg by mouth daily at 12 noon.   glucose blood (TRUE METRIX BLOOD GLUCOSE TEST) test strip Use to check blood sugar once daily   losartan (COZAAR) 25 MG tablet Take 1 tablet (25 mg total) by mouth daily.   melatonin 5 MG TABS Take 5 mg by mouth at bedtime as needed.   Multiple Vitamin (MULTIVITAMIN) capsule Take 1 capsule by mouth daily.   Naproxen Sodium 220 MG CAPS Take 1 capsule by mouth 2 (two) times daily as needed. Take one by mouth every 12 hours as needed.   polyethylene glycol (MIRALAX / GLYCOLAX) 17 g packet Take 17 g by mouth daily as needed for mild constipation.   prednisoLONE acetate (PRED FORTE) 1 % ophthalmic suspension Place 1 drop into both eyes at bedtime.   TRUEplus Lancets 33G MISC Use to obtain blood sugar sample once daily   UNABLE TO FIND Med Name: CBD Oil Give 0.75ml by mouth at bedtime   No facility-administered encounter medications on file as of 06/22/2023.    Review of Systems  Musculoskeletal:  Positive for arthralgias and joint swelling.    Immunization History  Administered Date(s) Administered   Fluad Quad(high Dose 65+) 11/12/2018, 11/06/2020   Influenza Split 12/13/2010, 12/08/2011   Influenza Whole 12/26/2006, 12/25/2008  Influenza, High Dose Seasonal PF 12/17/2014, 11/26/2015, 12/15/2016, 12/06/2017   Influenza,inj,Quad PF,6+ Mos 12/11/2012   Influenza-Unspecified 11/22/2013, 12/15/2016, 12/13/2019, 12/28/2021, 01/05/2023   Moderna SARS-COV2 Booster Vaccination 12/27/2019, 07/26/2020   Moderna Sars-Covid-2 Vaccination 03/28/2019, 04/25/2019   Pneumococcal Conjugate-13 04/29/2014   Pneumococcal Polysaccharide-23 03/14/2004, 08/09/2017   Td 07/09/2002   Unspecified SARS-COV-2 Vaccination 01/21/2022, 06/21/2022, 12/09/2022   Zoster, Live 05/03/2006   Pertinent  Health Maintenance Due  Topic Date Due   INFLUENZA VACCINE   10/13/2023   DEXA SCAN  Completed      11/06/2020    9:38 AM 01/06/2022   12:08 PM 08/19/2022    1:52 PM 01/06/2023    8:02 AM 01/06/2023    8:25 AM  Fall Risk  Falls in the past year? 0 0 0 0 0  Was there an injury with Fall?   0 0   Fall Risk Category Calculator   0 0   Patient at Risk for Falls Due to   No Fall Risks No Fall Risks   Fall risk Follow up   Falls evaluation completed Falls evaluation completed    Functional Status Survey:    Vitals:   06/22/23 1844  BP: 134/69  Pulse: 86  Temp: (!) 97.3 F (36.3 C)   There is no height or weight on file to calculate BMI. Physical Exam Constitutional:      Appearance: Normal appearance.  Pulmonary:     Effort: Pulmonary effort is normal.  Musculoskeletal:     Right elbow: Decreased range of motion. Tenderness present.  Neurological:     Mental Status: She is alert. Mental status is at baseline.  Psychiatric:        Mood and Affect: Mood normal.     Labs reviewed: Recent Labs    06/16/23 1447 06/17/23 0242 06/18/23 0542 06/22/23 0000  NA 136 136 134* 138  K 2.8* 3.9 4.4 3.7  CL 100 104 106 106  CO2 22 19* 25 23*  GLUCOSE 112* 92 107*  --   BUN 23 27* 23 11  CREATININE 0.89 0.92 0.77 0.6  CALCIUM 9.7 9.5 8.9 9.1  MG 2.0  --   --   --    Recent Labs    06/16/23 1447 06/17/23 0242 06/18/23 0542  AST 306* 235* 135*  ALT 86* 74* 61*  ALKPHOS 40 39 35*  BILITOT 2.0* 1.4* 0.9  PROT 6.4* 5.8* 4.9*  ALBUMIN 3.8 3.3* 2.8*   Recent Labs    06/16/23 1447 06/17/23 0242 06/18/23 0542 06/18/23 1409 06/22/23 0000  WBC 18.2* 17.1* 9.8 10.9* 5.3  NEUTROABS 16.3*  --   --   --  3,360.00  HGB 14.5 13.1 10.8* 11.7* 11.4*  HCT 40.0 36.4 30.5* 32.5* 34*  MCV 87.9 89.7 89.7 89.3  --   PLT 192 174 139* 140* 231   Lab Results  Component Value Date   TSH 1.14 04/26/2013   Lab Results  Component Value Date   HGBA1C 5.2 06/16/2023   Lab Results  Component Value Date   CHOL 178 06/17/2023   HDL 82  06/17/2023   LDLCALC 81 06/17/2023   LDLDIRECT 116.2 04/25/2012   TRIG 73 06/17/2023   CHOLHDL 2.2 06/17/2023    Significant Diagnostic Results in last 30 days:  ECHOCARDIOGRAM COMPLETE Result Date: 06/17/2023    ECHOCARDIOGRAM REPORT   Patient Name:   TIANE SZYDLOWSKI Date of Exam: 06/17/2023 Medical Rec #:  [id number]      Height:  58.0 in Accession #:    [id number]     Weight:       123.2 lb Date of Birth:  10/21/1923      BSA:          1.482 m Patient Age:    89 years       BP:           123/54 mmHg Patient Gender: F              HR:           79 bpm. Exam Location:  [facility] Procedure: 2D Echo, Strain Analysis, Color Doppler and Cardiac Doppler (Both            Spectral and Color Flow Doppler were utilized during procedure). Indications:     Abnormal ECG R94.31  History:         Patient has no prior history of Echocardiogram examinations.  Sonographer:     Clenton Czech RDCS, FASE Referring Phys:  [id number] YNWGNFA AMIN Diagnosing Phys: Constancia Delton MD IMPRESSIONS  1. Left ventricular ejection fraction, by estimation, is 60 to 65%. The left ventricle has normal function. The left ventricle has no regional wall motion abnormalities. There is mild left ventricular hypertrophy. Left ventricular diastolic parameters are consistent with Grade I diastolic dysfunction (impaired relaxation).  2. Right ventricular systolic function is normal. The right ventricular size is normal.  3. Left atrial size was mildly dilated.  4. The mitral valve is normal in structure. Mild mitral valve regurgitation.  5. The aortic valve is tricuspid. Aortic valve regurgitation is not visualized. Aortic valve sclerosis is present, with no evidence of aortic valve stenosis.  6. The inferior vena cava is normal in size with greater than 50% respiratory variability, suggesting right atrial pressure of 3 mmHg. FINDINGS  Left Ventricle: Left ventricular ejection fraction, by estimation, is 60 to 65%. The left ventricle has normal  function. The left ventricle has no regional wall motion abnormalities. Global longitudinal strain performed but not reported based on interpreter judgement due to suboptimal tracking. The left ventricular internal cavity size was normal in size. There is mild left ventricular hypertrophy. Left ventricular diastolic parameters are consistent with Grade I diastolic dysfunction (impaired relaxation). Right Ventricle: The right ventricular size is normal. No increase in right ventricular wall thickness. Right ventricular systolic function is normal. Left Atrium: Left atrial size was mildly dilated. Right Atrium: Right atrial size was normal in size. Pericardium: There is no evidence of pericardial effusion. Mitral Valve: The mitral valve is normal in structure. Mild mitral valve regurgitation. Tricuspid Valve: The tricuspid valve is normal in structure. Tricuspid valve regurgitation is mild. Aortic Valve: The aortic valve is tricuspid. Aortic valve regurgitation is not visualized. Aortic valve sclerosis is present, with no evidence of aortic valve stenosis. Aortic valve peak gradient measures 8.4 mmHg. Pulmonic Valve: The pulmonic valve was normal in structure. Pulmonic valve regurgitation is trivial. Aorta: The aortic root and ascending aorta are structurally normal, with no evidence of dilitation. Venous: The inferior vena cava is normal in size with greater than 50% respiratory variability, suggesting right atrial pressure of 3 mmHg. IAS/Shunts: No atrial level shunt detected by color flow Doppler.  LEFT VENTRICLE PLAX 2D LVIDd:         3.80 cm     Diastology LVIDs:         1.70 cm     LV e' medial:    4.03 cm/s LV PW:  1.20 cm     LV E/e' medial:  16.4 LV IVS:        0.90 cm     LV e' lateral:   3.81 cm/s LVOT diam:     2.00 cm     LV E/e' lateral: 17.3 LV SV:         58 LV SV Index:   39 LVOT Area:     3.14 cm  LV Volumes (MOD) LV vol d, MOD A4C: 57.3 ml LV vol s, MOD A4C: 17.5 ml LV SV MOD A4C:     57.3  ml RIGHT VENTRICLE RV Basal diam:  2.60 cm RV S prime:     17.30 cm/s TAPSE (M-mode): 1.9 cm LEFT ATRIUM           Index        RIGHT ATRIUM           Index LA diam:      2.90 cm 1.96 cm/m   RA Area:     12.80 cm LA Vol (A2C): 25.3 ml 17.07 ml/m  RA Volume:   29.10 ml  19.63 ml/m LA Vol (A4C): 50.8 ml 34.27 ml/m  AORTIC VALVE                 PULMONIC VALVE AV Area (Vmax): 2.51 cm     PV Vmax:          1.35 m/s AV Vmax:        145.00 cm/s  PV Peak grad:     7.3 mmHg AV Peak Grad:   8.4 mmHg     PR End Diast Vel: 5.66 msec LVOT Vmax:      116.00 cm/s  RVOT Peak grad:   8 mmHg LVOT Vmean:     82.400 cm/s LVOT VTI:       0.185 m  AORTA Ao Root diam: 2.90 cm Ao Asc diam:  2.80 cm MITRAL VALVE                TRICUSPID VALVE MV Area (PHT): 4.68 cm     TR Peak grad:   26.6 mmHg MV Decel Time: 162 msec     TR Vmax:        258.00 cm/s MV E velocity: 66.10 cm/s MV A velocity: 106.00 cm/s  SHUNTS MV E/A ratio:  0.62         Systemic VTI:  0.18 m                             Systemic Diam: 2.00 cm Constancia Delton MD Electronically signed by Constancia Delton MD Signature Date/Time: 06/17/2023/9:46:34 AM    Final    CT Cervical Spine Wo Contrast Result Date: 06/16/2023 CLINICAL DATA:  Facial trauma. EXAM: CT CERVICAL SPINE WITHOUT CONTRAST TECHNIQUE: Multidetector CT imaging of the cervical spine was performed without intravenous contrast. Multiplanar CT image reconstructions were also generated. RADIATION DOSE REDUCTION: This exam was performed according to the departmental dose-optimization program which includes automated exposure control, adjustment of the mA and/or kV according to patient size and/or use of iterative reconstruction technique. COMPARISON:  None Available. FINDINGS: Alignment: Normal. Skull base and vertebrae: No acute fracture. No primary bone lesion or focal pathologic process. Soft tissues and spinal canal: No prevertebral fluid or swelling. No visible canal hematoma. Disc levels: There is disc  space narrowing and endplate osteophyte formation throughout the cervical spine most significant at C5-C6 and C6-C7. No  significant central canal or neural foraminal stenosis at any level. Upper chest: Negative. Other: Bilateral thyroid nodules are present measuring up to 1 cm. IMPRESSION: Impression 1. No acute fracture or traumatic subluxation of the cervical spine. 2. Mild degenerative changes. 3. Thyroid nodules measuring up to 1 cm. No follow-up imaging recommended. (Ref: J Am Coll Radiol. 2015 Feb;12(2): 143-50). Electronically Signed   By: Tyron Gallon M.D.   On: 06/16/2023 17:20   CT Head Wo Contrast Result Date: 06/16/2023 CLINICAL DATA:  Trauma. EXAM: CT HEAD WITHOUT CONTRAST TECHNIQUE: Contiguous axial images were obtained from the base of the skull through the vertex without intravenous contrast. RADIATION DOSE REDUCTION: This exam was performed according to the departmental dose-optimization program which includes automated exposure control, adjustment of the mA and/or kV according to patient size and/or use of iterative reconstruction technique. COMPARISON:  None Available. FINDINGS: Brain: No evidence of acute infarction, hemorrhage, hydrocephalus, extra-axial collection or mass lesion/mass effect. There is mild periventricular white matter hypodensity, likely chronic small vessel ischemic change. Vascular: No hyperdense vessel or unexpected calcification. Skull: Normal. Negative for fracture or focal lesion. Sinuses/Orbits: No acute finding. Other: None. IMPRESSION: No acute intracranial abnormality. Electronically Signed   By: Tyron Gallon M.D.   On: 06/16/2023 16:41   DG Chest Port 1 View Result Date: 06/16/2023 CLINICAL DATA:  Questionable sepsis. EXAM: PORTABLE CHEST 1 VIEW COMPARISON:  None available. FINDINGS: Shallow inspiration. No focal consolidation or pneumothorax. Trace pleural effusions may be present. The cardiac silhouette is within limits. No acute osseous pathology. IMPRESSION:  No focal consolidation. Trace pleural effusions may be present. Electronically Signed   By: Angus Bark M.D.   On: 06/16/2023 16:29   DG HIP UNILAT WITH PELVIS 2-3 VIEWS RIGHT Result Date: 06/16/2023 CLINICAL DATA:  Fall. EXAM: DG HIP (WITH OR WITHOUT PELVIS) 2-3V RIGHT COMPARISON:  None Available. FINDINGS: Pelvis is intact with normal and symmetric sacroiliac joints. No acute fracture or dislocation. No aggressive osseous lesion. Visualized sacral arcuate lines are unremarkable. Unremarkable symphysis pubis. There are mild degenerative changes of bilateral hip joints without significant joint space narrowing. Osteophytosis of the superior acetabulum. No radiopaque foreign bodies. IMPRESSION: No acute osseous abnormality of the pelvis or right hip joint. Electronically Signed   By: Beula Brunswick M.D.   On: 06/16/2023 16:29    Assessment/Plan 1. Right elbow pain (Primary) -aleve 1 tablet BID for 3 days -tylenol 1000 mg every 8 hours PRN pain Will get xray to evaluate elbow-- none found during hospitalization per epic review.  Continue ice PRN    Gustin Zobrist K. Denney Fisherman [facility] [phone]

## 2023-06-23 ENCOUNTER — Encounter: Payer: Self-pay | Admitting: Student

## 2023-06-23 ENCOUNTER — Non-Acute Institutional Stay (SKILLED_NURSING_FACILITY): Payer: Self-pay | Admitting: Student

## 2023-06-23 DIAGNOSIS — M25521 Pain in right elbow: Secondary | ICD-10-CM

## 2023-06-23 NOTE — Progress Notes (Signed)
 Location:  Other [place].  Nursing Home Room Number: [facility] 115A Place of Service:  SNF [phone]) Provider:  Dr. Valrie Gehrig  PCP: Helaine Llanos, MD  Patient Care Team: Helaine Llanos, MD as PCP - General  Extended Emergency Contact Information Primary Emergency Contact: Light,Mollie  [place] Home Phone: [phone] Relation: Daughter Secondary Emergency Contact: Nour,Martivonne  [place] Home Phone: [phone] Relation: Niece  Code Status:  DNR Goals of care: Advanced Directive information    06/21/2023    9:03 AM  Advanced Directives  Does Patient Have a Medical Advance Directive? Yes  Type of Advance Directive Out of facility DNR (pink MOST or yellow form)  Does patient want to make changes to medical advance directive? No - Patient declined     Chief Complaint  Patient presents with   Elbow Pain    Elbow Pain.     HPI:  Pt is a 89 y.o. female seen today for an acute visit for Elbow Pain.   Patient with some right elbow pain. X-ray negative for acute fracture at this time.   She states she is glad there is no fracture at this time. She was hoping we could spend more time together.  Past Medical History:  Diagnosis Date   Diverticulosis    GERD (gastroesophageal reflux disease)    Hyperlipidemia    Hypertension    Osteoporosis    Polymyalgia rheumatica (HCC)    Wears dentures    partial upper and lower   Wears hearing aid in both ears    Past Surgical History:  Procedure Laterality Date   ANKLE FRACTURE SURGERY  07/12/2005   CATARACT EXTRACTION  2012   right   CATARACT EXTRACTION W/PHACO Left 09/07/2020   Procedure: CATARACT EXTRACTION PHACO AND INTRAOCULAR LENS PLACEMENT (IOC) LEFT 8.30 01:09.7;  Surgeon: Annell Kidney, MD;  Location: [facility];  Service: Ophthalmology;  Laterality: Left;  requests early as possible   TONSILLECTOMY      Allergies  Allergen Reactions   Alendronate  Sodium     REACTION: GI    Outpatient Encounter Medications as of 06/23/2023  Medication Sig   acetaminophen (TYLENOL) 500 MG tablet Take 1,000 mg by mouth every 8 (eight) hours as needed.   esomeprazole (NEXIUM) 20 MG capsule Take 20 mg by mouth daily at 12 noon.   glucose blood (TRUE METRIX BLOOD GLUCOSE TEST) test strip Use to check blood sugar once daily   losartan (COZAAR) 25 MG tablet Take 1 tablet (25 mg total) by mouth daily.   melatonin 5 MG TABS Take 5 mg by mouth at bedtime as needed.   Multiple Vitamin (MULTIVITAMIN) capsule Take 1 capsule by mouth daily.   Naproxen Sodium 220 MG CAPS Take 1 capsule by mouth 2 (two) times daily as needed. Take one by mouth every 12 hours as needed.   polyethylene glycol (MIRALAX / GLYCOLAX) 17 g packet Take 17 g by mouth daily as needed for mild constipation.   prednisoLONE acetate (PRED FORTE) 1 % ophthalmic suspension Place 1 drop into both eyes at bedtime.   TRUEplus Lancets 33G MISC Use to obtain blood sugar sample once daily   UNABLE TO FIND Med Name: CBD Oil (Patient not taking: Med Name: CBD Oil)   No facility-administered encounter medications on file as of 06/23/2023.    Review of Systems  Immunization History  Administered Date(s) Administered   Fluad Quad(high Dose 65+) 11/12/2018, 11/06/2020   Influenza Split  12/13/2010, 12/08/2011   Influenza Whole 12/26/2006, 12/25/2008   Influenza, High Dose Seasonal PF 12/17/2014, 11/26/2015, 12/15/2016, 12/06/2017   Influenza,inj,Quad PF,6+ Mos 12/11/2012   Influenza-Unspecified 11/22/2013, 12/15/2016, 12/13/2019, 12/28/2021, 01/05/2023   Moderna SARS-COV2 Booster Vaccination 12/27/2019, 07/26/2020   Moderna Sars-Covid-2 Vaccination 03/28/2019, 04/25/2019   Pneumococcal Conjugate-13 04/29/2014   Pneumococcal Polysaccharide-23 03/14/2004, 08/09/2017   Td 07/09/2002   Unspecified SARS-COV-2 Vaccination 12/09/2022   Zoster, Live 05/03/2006   Pertinent  Health Maintenance Due  Topic Date  Due   INFLUENZA VACCINE  10/13/2023   DEXA SCAN  Completed      11/06/2020    9:38 AM 01/06/2022   12:08 PM 08/19/2022    1:52 PM 01/06/2023    8:02 AM 01/06/2023    8:25 AM  Fall Risk  Falls in the past year? 0 0 0 0 0  Was there an injury with Fall?   0 0   Fall Risk Category Calculator   0 0   Patient at Risk for Falls Due to   No Fall Risks No Fall Risks   Fall risk Follow up   Falls evaluation completed Falls evaluation completed    Functional Status Survey:    Vitals:   06/23/23 1105  BP: 118/62  Pulse: 84  Resp: (!) 24  Temp: (!) 97.4 F (36.3 C)  SpO2: 95%  Weight: 125 lb 9.6 oz (57 kg)  Height: 4\' 9"  (1.448 m)   Body mass index is 27.18 kg/m. Physical Exam  Labs reviewed: Recent Labs    06/16/23 1447 06/17/23 0242 06/18/23 0542 06/22/23 0000  NA 136 136 134* 138  K 2.8* 3.9 4.4 3.7  CL 100 104 106 106  CO2 22 19* 25 23*  GLUCOSE 112* 92 107*  --   BUN 23 27* 23 11  CREATININE 0.89 0.92 0.77 0.6  CALCIUM 9.7 9.5 8.9 9.1  MG 2.0  --   --   --    Recent Labs    06/16/23 1447 06/17/23 0242 06/18/23 0542  AST 306* 235* 135*  ALT 86* 74* 61*  ALKPHOS 40 39 35*  BILITOT 2.0* 1.4* 0.9  PROT 6.4* 5.8* 4.9*  ALBUMIN 3.8 3.3* 2.8*   Recent Labs    06/16/23 1447 06/17/23 0242 06/18/23 0542 06/18/23 1409 06/22/23 0000  WBC 18.2* 17.1* 9.8 10.9* 5.3  NEUTROABS 16.3*  --   --   --  3,360.00  HGB 14.5 13.1 10.8* 11.7* 11.4*  HCT 40.0 36.4 30.5* 32.5* 34*  MCV 87.9 89.7 89.7 89.3  --   PLT 192 174 139* 140* 231   Lab Results  Component Value Date   TSH 1.14 04/26/2013   Lab Results  Component Value Date   HGBA1C 5.2 06/16/2023   Lab Results  Component Value Date   CHOL 178 06/17/2023   HDL 82 06/17/2023   LDLCALC 81 06/17/2023   LDLDIRECT 116.2 04/25/2012   TRIG 73 06/17/2023   CHOLHDL 2.2 06/17/2023    Significant Diagnostic Results in last 30 days:  ECHOCARDIOGRAM COMPLETE Result Date: 06/17/2023    ECHOCARDIOGRAM REPORT    [name] Date of Exam: 06/17/2023 Medical Rec #:  [id number]      Height:       58.0 in Accession #:    [id number]     Weight:       123.2 lb Date of Birth:  23-Mar-1923      BSA:  1.482 m Patient Age:    89 years       BP:           123/54 mmHg Patient Gender: F              HR:           79 bpm. Exam Location:  [facility] Procedure: 2D Echo, Strain Analysis, Color Doppler and Cardiac Doppler (Both            Spectral and Color Flow Doppler were utilized during procedure). Indications:     Abnormal ECG R94.31  History:         Patient has no prior history of Echocardiogram examinations.  Sonographer:     Clenton Czech RDCS, FASE Referring Phys:  [id number] AVWUJWJ AMIN Diagnosing Phys: Constancia Delton MD IMPRESSIONS  1. Left ventricular ejection fraction, by estimation, is 60 to 65%. The left ventricle has normal function. The left ventricle has no regional wall motion abnormalities. There is mild left ventricular hypertrophy. Left ventricular diastolic parameters are consistent with Grade I diastolic dysfunction (impaired relaxation).  2. Right ventricular systolic function is normal. The right ventricular size is normal.  3. Left atrial size was mildly dilated.  4. The mitral valve is normal in structure. Mild mitral valve regurgitation.  5. The aortic valve is tricuspid. Aortic valve regurgitation is not visualized. Aortic valve sclerosis is present, with no evidence of aortic valve stenosis.  6. The inferior vena cava is normal in size with greater than 50% respiratory variability, suggesting right atrial pressure of 3 mmHg. FINDINGS  Left Ventricle: Left ventricular ejection fraction, by estimation, is 60 to 65%. The left ventricle has normal function. The left ventricle has no regional wall motion abnormalities. Global longitudinal strain performed but not reported based on interpreter judgement due to suboptimal tracking. The left ventricular internal cavity size was normal in size.  There is mild left ventricular hypertrophy. Left ventricular diastolic parameters are consistent with Grade I diastolic dysfunction (impaired relaxation). Right Ventricle: The right ventricular size is normal. No increase in right ventricular wall thickness. Right ventricular systolic function is normal. Left Atrium: Left atrial size was mildly dilated. Right Atrium: Right atrial size was normal in size. Pericardium: There is no evidence of pericardial effusion. Mitral Valve: The mitral valve is normal in structure. Mild mitral valve regurgitation. Tricuspid Valve: The tricuspid valve is normal in structure. Tricuspid valve regurgitation is mild. Aortic Valve: The aortic valve is tricuspid. Aortic valve regurgitation is not visualized. Aortic valve sclerosis is present, with no evidence of aortic valve stenosis. Aortic valve peak gradient measures 8.4 mmHg. Pulmonic Valve: The pulmonic valve was normal in structure. Pulmonic valve regurgitation is trivial. Aorta: The aortic root and ascending aorta are structurally normal, with no evidence of dilitation. Venous: The inferior vena cava is normal in size with greater than 50% respiratory variability, suggesting right atrial pressure of 3 mmHg. IAS/Shunts: No atrial level shunt detected by color flow Doppler.  LEFT VENTRICLE PLAX 2D LVIDd:         3.80 cm     Diastology LVIDs:         1.70 cm     LV e' medial:    4.03 cm/s LV PW:         1.20 cm     LV E/e' medial:  16.4 LV IVS:        0.90 cm     LV e' lateral:   3.81 cm/s LVOT diam:  2.00 cm     LV E/e' lateral: 17.3 LV SV:         58 LV SV Index:   39 LVOT Area:     3.14 cm  LV Volumes (MOD) LV vol d, MOD A4C: 57.3 ml LV vol s, MOD A4C: 17.5 ml LV SV MOD A4C:     57.3 ml RIGHT VENTRICLE RV Basal diam:  2.60 cm RV S prime:     17.30 cm/s TAPSE (M-mode): 1.9 cm LEFT ATRIUM           Index        RIGHT ATRIUM           Index LA diam:      2.90 cm 1.96 cm/m   RA Area:     12.80 cm LA Vol (A2C): 25.3 ml 17.07 ml/m   RA Volume:   29.10 ml  19.63 ml/m LA Vol (A4C): 50.8 ml 34.27 ml/m  AORTIC VALVE                 PULMONIC VALVE AV Area (Vmax): 2.51 cm     PV Vmax:          1.35 m/s AV Vmax:        145.00 cm/s  PV Peak grad:     7.3 mmHg AV Peak Grad:   8.4 mmHg     PR End Diast Vel: 5.66 msec LVOT Vmax:      116.00 cm/s  RVOT Peak grad:   8 mmHg LVOT Vmean:     82.400 cm/s LVOT VTI:       0.185 m  AORTA Ao Root diam: 2.90 cm Ao Asc diam:  2.80 cm MITRAL VALVE                TRICUSPID VALVE MV Area (PHT): 4.68 cm     TR Peak grad:   26.6 mmHg MV Decel Time: 162 msec     TR Vmax:        258.00 cm/s MV E velocity: 66.10 cm/s MV A velocity: 106.00 cm/s  SHUNTS MV E/A ratio:  0.62         Systemic VTI:  0.18 m                             Systemic Diam: 2.00 cm Constancia Delton MD Electronically signed by Constancia Delton MD Signature Date/Time: 06/17/2023/9:46:34 AM    Final    CT Cervical Spine Wo Contrast Result Date: 06/16/2023 CLINICAL DATA:  Facial trauma. EXAM: CT CERVICAL SPINE WITHOUT CONTRAST TECHNIQUE: Multidetector CT imaging of the cervical spine was performed without intravenous contrast. Multiplanar CT image reconstructions were also generated. RADIATION DOSE REDUCTION: This exam was performed according to the departmental dose-optimization program which includes automated exposure control, adjustment of the mA and/or kV according to patient size and/or use of iterative reconstruction technique. COMPARISON:  None Available. FINDINGS: Alignment: Normal. Skull base and vertebrae: No acute fracture. No primary bone lesion or focal pathologic process. Soft tissues and spinal canal: No prevertebral fluid or swelling. No visible canal hematoma. Disc levels: There is disc space narrowing and endplate osteophyte formation throughout the cervical spine most significant at C5-C6 and C6-C7. No significant central canal or neural foraminal stenosis at any level. Upper chest: Negative. Other: Bilateral thyroid nodules are  present measuring up to 1 cm. IMPRESSION: Impression 1. No acute fracture or traumatic subluxation of the cervical spine. 2. Mild  degenerative changes. 3. Thyroid nodules measuring up to 1 cm. No follow-up imaging recommended. (Ref: J Am Coll Radiol. 2015 Feb;12(2): 143-50). Electronically Signed   By: Tyron Gallon M.D.   On: 06/16/2023 17:20   CT Head Wo Contrast Result Date: 06/16/2023 CLINICAL DATA:  Trauma. EXAM: CT HEAD WITHOUT CONTRAST TECHNIQUE: Contiguous axial images were obtained from the base of the skull through the vertex without intravenous contrast. RADIATION DOSE REDUCTION: This exam was performed according to the departmental dose-optimization program which includes automated exposure control, adjustment of the mA and/or kV according to patient size and/or use of iterative reconstruction technique. COMPARISON:  None Available. FINDINGS: Brain: No evidence of acute infarction, hemorrhage, hydrocephalus, extra-axial collection or mass lesion/mass effect. There is mild periventricular white matter hypodensity, likely chronic small vessel ischemic change. Vascular: No hyperdense vessel or unexpected calcification. Skull: Normal. Negative for fracture or focal lesion. Sinuses/Orbits: No acute finding. Other: None. IMPRESSION: No acute intracranial abnormality. Electronically Signed   By: Tyron Gallon M.D.   On: 06/16/2023 16:41   DG Chest Port 1 View Result Date: 06/16/2023 CLINICAL DATA:  Questionable sepsis. EXAM: PORTABLE CHEST 1 VIEW COMPARISON:  None available. FINDINGS: Shallow inspiration. No focal consolidation or pneumothorax. Trace pleural effusions may be present. The cardiac silhouette is within limits. No acute osseous pathology. IMPRESSION: No focal consolidation. Trace pleural effusions may be present. Electronically Signed   By: Angus Bark M.D.   On: 06/16/2023 16:29   DG HIP UNILAT WITH PELVIS 2-3 VIEWS RIGHT Result Date: 06/16/2023 CLINICAL DATA:  Fall. EXAM: DG HIP (WITH  OR WITHOUT PELVIS) 2-3V RIGHT COMPARISON:  None Available. FINDINGS: Pelvis is intact with normal and symmetric sacroiliac joints. No acute fracture or dislocation. No aggressive osseous lesion. Visualized sacral arcuate lines are unremarkable. Unremarkable symphysis pubis. There are mild degenerative changes of bilateral hip joints without significant joint space narrowing. Osteophytosis of the superior acetabulum. No radiopaque foreign bodies. IMPRESSION: No acute osseous abnormality of the pelvis or right hip joint. Electronically Signed   By: Beula Brunswick M.D.   On: 06/16/2023 16:29    Assessment/Plan Right elbow pain Continue pain control with tylenol, ice packs.   Family/ staff Communication: nursing  Labs/tests ordered:  none

## 2023-06-24 ENCOUNTER — Encounter: Payer: Self-pay | Admitting: Student

## 2023-06-26 ENCOUNTER — Encounter: Payer: Self-pay | Admitting: Student

## 2023-06-26 ENCOUNTER — Non-Acute Institutional Stay (SKILLED_NURSING_FACILITY): Payer: Self-pay | Admitting: Student

## 2023-06-26 DIAGNOSIS — R600 Localized edema: Secondary | ICD-10-CM

## 2023-06-26 DIAGNOSIS — T148XXA Other injury of unspecified body region, initial encounter: Secondary | ICD-10-CM

## 2023-06-26 DIAGNOSIS — L089 Local infection of the skin and subcutaneous tissue, unspecified: Secondary | ICD-10-CM | POA: Diagnosis not present

## 2023-06-26 NOTE — Progress Notes (Signed)
 Location:  Other [place].  Nursing Home Room Number: [facility] 115A Place of Service:  SNF [phone]) Provider:  Dr. Earnestine Mealing  PCP: Karie Schwalbe, MD  Patient Care Team: Karie Schwalbe, MD as PCP - General  Extended Emergency Contact Information Primary Emergency Contact: Rhea [place] Home Phone: [phone] Relation: Daughter Secondary Emergency Contact: Nour,Martivonne  [place] Home Phone: [phone] Relation: Niece  Code Status:  DNR Goals of care: Advanced Directive information    06/21/2023    9:03 AM  Advanced Directives  Does Patient Have a Medical Advance Directive? Yes  Type of Advance Directive Out of facility DNR (pink MOST or yellow form)  Does patient want to make changes to medical advance directive? No - Patient declined     Chief Complaint  Patient presents with   Wound Check    Wound Check.     HPI:  Pt is a 89 y.o. female seen today for Wound Check.  History of Present Illness The patient presents with a wound on her leg and swelling in her feet.  She has a wound on her leg that has turned red and appears inflamed, resulting from a fall onto a hot Trex deck, potentially causing a burn. There is drainage from the wound, and although the wound is new, there was a scrape in the same area previously. She is concerned about her clothes sticking to the wound due to drainage.  She experiences swelling in her feet, which is unusual for her. Her massage therapist usually helps reduce the swelling, and she recently tried compression socks, but they were too tight and uncomfortable. Her caregiver notes that her feet are more swollen today than previously observed. She is open to trying different sizes of compression socks or ace bandages to manage the swelling.  She has a wound on her elbow, which is reportedly better than the one on her leg. Her skin is described as fragile, making her susceptible to  injuries.  She is currently taking doxycycline, an antibiotic, twice a day to manage the infection. She has been given melatonin to help with sleep, as she has not been sleeping well. Aleve is used as needed for pain management.  She mentions that she has not been informed about dietary restrictions, particularly regarding salt intake, and wants guidance on this matter.   Past Medical History:  Diagnosis Date   Diverticulosis    GERD (gastroesophageal reflux disease)    Hyperlipidemia    Hypertension    Osteoporosis    Polymyalgia rheumatica (HCC)    Wears dentures    partial upper and lower   Wears hearing aid in both ears    Past Surgical History:  Procedure Laterality Date   ANKLE FRACTURE SURGERY  07/12/2005   CATARACT EXTRACTION  2012   right   CATARACT EXTRACTION W/PHACO Left 09/07/2020   Procedure: CATARACT EXTRACTION PHACO AND INTRAOCULAR LENS PLACEMENT (IOC) LEFT 8.30 01:09.7;  Surgeon: Lockie Mola, MD;  Location: [facility];  Service: Ophthalmology;  Laterality: Left;  requests early as possible   TONSILLECTOMY      Allergies  Allergen Reactions   Alendronate Sodium     REACTION: GI    Outpatient Encounter Medications as of 06/26/2023  Medication Sig   acetaminophen (TYLENOL) 500 MG tablet Take 1,000 mg by mouth every 8 (eight) hours as needed.   esomeprazole (NEXIUM) 20 MG capsule Take 20 mg by mouth daily at 12 noon.  glucose blood (TRUE METRIX BLOOD GLUCOSE TEST) test strip Use to check blood sugar once daily   losartan (COZAAR) 25 MG tablet Take 1 tablet (25 mg total) by mouth daily.   melatonin 5 MG TABS Take 5 mg by mouth at bedtime as needed.   Multiple Vitamin (MULTIVITAMIN) capsule Take 1 capsule by mouth daily.   Naproxen Sodium 220 MG CAPS Take 1 capsule by mouth 2 (two) times daily as needed. Take one by mouth every 12 hours as needed.   polyethylene glycol (MIRALAX / GLYCOLAX) 17 g packet Take 17 g by mouth daily as needed for mild  constipation.   prednisoLONE acetate (PRED FORTE) 1 % ophthalmic suspension Place 1 drop into both eyes at bedtime.   TRUEplus Lancets 33G MISC Use to obtain blood sugar sample once daily   UNABLE TO FIND Med Name: CBD Oil Give 0.75ml by mouth at bedtime   No facility-administered encounter medications on file as of 06/26/2023.    Review of Systems  Immunization History  Administered Date(s) Administered   Fluad Quad(high Dose 65+) 11/12/2018, 11/06/2020   Influenza Split 12/13/2010, 12/08/2011   Influenza Whole 12/26/2006, 12/25/2008   Influenza, High Dose Seasonal PF 12/17/2014, 11/26/2015, 12/15/2016, 12/06/2017   Influenza,inj,Quad PF,6+ Mos 12/11/2012   Influenza-Unspecified 11/22/2013, 12/15/2016, 12/13/2019, 12/28/2021, 01/05/2023   Moderna SARS-COV2 Booster Vaccination 12/27/2019, 07/26/2020   Moderna Sars-Covid-2 Vaccination 03/28/2019, 04/25/2019   Pneumococcal Conjugate-13 04/29/2014   Pneumococcal Polysaccharide-23 03/14/2004, 08/09/2017   Td 07/09/2002   Unspecified SARS-COV-2 Vaccination 01/21/2022, 06/21/2022, 12/09/2022   Zoster, Live 05/03/2006   Pertinent  Health Maintenance Due  Topic Date Due   INFLUENZA VACCINE  10/13/2023   DEXA SCAN  Completed      11/06/2020    9:38 AM 01/06/2022   12:08 PM 08/19/2022    1:52 PM 01/06/2023    8:02 AM 01/06/2023    8:25 AM  Fall Risk  Falls in the past year? 0 0 0 0 0  Was there an injury with Fall?   0 0   Fall Risk Category Calculator   0 0   Patient at Risk for Falls Due to   No Fall Risks No Fall Risks   Fall risk Follow up   Falls evaluation completed Falls evaluation completed    Functional Status Survey:    Vitals:   06/26/23 0943 06/26/23 0951  BP: (!) 148/68 (!) 148/68  Pulse: 75   Resp: 18   Temp: (!) 97.4 F (36.3 C)   SpO2: 96%   Weight: 125 lb 9.6 oz (57 kg)   Height: 4\' 9"  (1.448 m)    Body mass index is 27.18 kg/m. Physical Exam Musculoskeletal:     Right lower leg: Edema present.      Left lower leg: Edema present.  Skin:    Comments: Multiple skin abrasions of right leg and right elbow with purulent drainage, surrounding erythema.      Labs reviewed: Recent Labs    06/16/23 1447 06/17/23 0242 06/18/23 0542 06/22/23 0000  NA 136 136 134* 138  K 2.8* 3.9 4.4 3.7  CL 100 104 106 106  CO2 22 19* 25 23*  GLUCOSE 112* 92 107*  --   BUN 23 27* 23 11  CREATININE 0.89 0.92 0.77 0.6  CALCIUM 9.7 9.5 8.9 9.1  MG 2.0  --   --   --    Recent Labs    06/16/23 1447 06/17/23 0242 06/18/23 0542  AST 306* 235* 135*  ALT  86* 74* 61*  ALKPHOS 40 39 35*  BILITOT 2.0* 1.4* 0.9  PROT 6.4* 5.8* 4.9*  ALBUMIN 3.8 3.3* 2.8*   Recent Labs    06/16/23 1447 06/17/23 0242 06/18/23 0542 06/18/23 1409 06/22/23 0000  WBC 18.2* 17.1* 9.8 10.9* 5.3  NEUTROABS 16.3*  --   --   --  3,360.00  HGB 14.5 13.1 10.8* 11.7* 11.4*  HCT 40.0 36.4 30.5* 32.5* 34*  MCV 87.9 89.7 89.7 89.3  --   PLT 192 174 139* 140* 231   Lab Results  Component Value Date   TSH 1.14 04/26/2013   Lab Results  Component Value Date   HGBA1C 5.2 06/16/2023   Lab Results  Component Value Date   CHOL 178 06/17/2023   HDL 82 06/17/2023   LDLCALC 81 06/17/2023   LDLDIRECT 116.2 04/25/2012   TRIG 73 06/17/2023   CHOLHDL 2.2 06/17/2023    Significant Diagnostic Results in last 30 days:  ECHOCARDIOGRAM COMPLETE Result Date: 06/17/2023    ECHOCARDIOGRAM REPORT   Patient Name:   SILAS SEDAM Date of Exam: 06/17/2023 Medical Rec #:  [id number]      Height:       58.0 in Accession #:    [id number]     Weight:       123.2 lb Date of Birth:  1924-03-09      BSA:          1.482 m Patient Age:    89 years       BP:           123/54 mmHg Patient Gender: F              HR:           79 bpm. Exam Location:  [facility] Procedure: 2D Echo, Strain Analysis, Color Doppler and Cardiac Doppler (Both            Spectral and Color Flow Doppler were utilized during procedure). Indications:     Abnormal ECG R94.31  History:          Patient has no prior history of Echocardiogram examinations.  Sonographer:     Overton Mam RDCS, FASE Referring Phys:  [id number] AMIN Diagnosing Phys: Debbe Odea MD IMPRESSIONS  1. Left ventricular ejection fraction, by estimation, is 60 to 65%. The left ventricle has normal function. The left ventricle has no regional wall motion abnormalities. There is mild left ventricular hypertrophy. Left ventricular diastolic parameters are consistent with Grade I diastolic dysfunction (impaired relaxation).  2. Right ventricular systolic function is normal. The right ventricular size is normal.  3. Left atrial size was mildly dilated.  4. The mitral valve is normal in structure. Mild mitral valve regurgitation.  5. The aortic valve is tricuspid. Aortic valve regurgitation is not visualized. Aortic valve sclerosis is present, with no evidence of aortic valve stenosis.  6. The inferior vena cava is normal in size with greater than 50% respiratory variability, suggesting right atrial pressure of 3 mmHg. FINDINGS  Left Ventricle: Left ventricular ejection fraction, by estimation, is 60 to 65%. The left ventricle has normal function. The left ventricle has no regional wall motion abnormalities. Global longitudinal strain performed but not reported based on interpreter judgement due to suboptimal tracking. The left ventricular internal cavity size was normal in size. There is mild left ventricular hypertrophy. Left ventricular diastolic parameters are consistent with Grade I diastolic dysfunction (impaired relaxation). Right Ventricle: The right ventricular size is normal. No increase in  right ventricular wall thickness. Right ventricular systolic function is normal. Left Atrium: Left atrial size was mildly dilated. Right Atrium: Right atrial size was normal in size. Pericardium: There is no evidence of pericardial effusion. Mitral Valve: The mitral valve is normal in structure. Mild mitral valve regurgitation.  Tricuspid Valve: The tricuspid valve is normal in structure. Tricuspid valve regurgitation is mild. Aortic Valve: The aortic valve is tricuspid. Aortic valve regurgitation is not visualized. Aortic valve sclerosis is present, with no evidence of aortic valve stenosis. Aortic valve peak gradient measures 8.4 mmHg. Pulmonic Valve: The pulmonic valve was normal in structure. Pulmonic valve regurgitation is trivial. Aorta: The aortic root and ascending aorta are structurally normal, with no evidence of dilitation. Venous: The inferior vena cava is normal in size with greater than 50% respiratory variability, suggesting right atrial pressure of 3 mmHg. IAS/Shunts: No atrial level shunt detected by color flow Doppler.  LEFT VENTRICLE PLAX 2D LVIDd:         3.80 cm     Diastology LVIDs:         1.70 cm     LV e' medial:    4.03 cm/s LV PW:         1.20 cm     LV E/e' medial:  16.4 LV IVS:        0.90 cm     LV e' lateral:   3.81 cm/s LVOT diam:     2.00 cm     LV E/e' lateral: 17.3 LV SV:         58 LV SV Index:   39 LVOT Area:     3.14 cm  LV Volumes (MOD) LV vol d, MOD A4C: 57.3 ml LV vol s, MOD A4C: 17.5 ml LV SV MOD A4C:     57.3 ml RIGHT VENTRICLE RV Basal diam:  2.60 cm RV S prime:     17.30 cm/s TAPSE (M-mode): 1.9 cm LEFT ATRIUM           Index        RIGHT ATRIUM           Index LA diam:      2.90 cm 1.96 cm/m   RA Area:     12.80 cm LA Vol (A2C): 25.3 ml 17.07 ml/m  RA Volume:   29.10 ml  19.63 ml/m LA Vol (A4C): 50.8 ml 34.27 ml/m  AORTIC VALVE                 PULMONIC VALVE AV Area (Vmax): 2.51 cm     PV Vmax:          1.35 m/s AV Vmax:        145.00 cm/s  PV Peak grad:     7.3 mmHg AV Peak Grad:   8.4 mmHg     PR End Diast Vel: 5.66 msec LVOT Vmax:      116.00 cm/s  RVOT Peak grad:   8 mmHg LVOT Vmean:     82.400 cm/s LVOT VTI:       0.185 m  AORTA Ao Root diam: 2.90 cm Ao Asc diam:  2.80 cm MITRAL VALVE                TRICUSPID VALVE MV Area (PHT): 4.68 cm     TR Peak grad:   26.6 mmHg MV Decel Time:  162 msec     TR Vmax:        258.00 cm/s MV E velocity: 66.10  cm/s MV A velocity: 106.00 cm/s  SHUNTS MV E/A ratio:  0.62         Systemic VTI:  0.18 m                             Systemic Diam: 2.00 cm Debbe Odea MD Electronically signed by Debbe Odea MD Signature Date/Time: 06/17/2023/9:46:34 AM    Final    CT Cervical Spine Wo Contrast Result Date: 06/16/2023 CLINICAL DATA:  Facial trauma. EXAM: CT CERVICAL SPINE WITHOUT CONTRAST TECHNIQUE: Multidetector CT imaging of the cervical spine was performed without intravenous contrast. Multiplanar CT image reconstructions were also generated. RADIATION DOSE REDUCTION: This exam was performed according to the departmental dose-optimization program which includes automated exposure control, adjustment of the mA and/or kV according to patient size and/or use of iterative reconstruction technique. COMPARISON:  None Available. FINDINGS: Alignment: Normal. Skull base and vertebrae: No acute fracture. No primary bone lesion or focal pathologic process. Soft tissues and spinal canal: No prevertebral fluid or swelling. No visible canal hematoma. Disc levels: There is disc space narrowing and endplate osteophyte formation throughout the cervical spine most significant at C5-C6 and C6-C7. No significant central canal or neural foraminal stenosis at any level. Upper chest: Negative. Other: Bilateral thyroid nodules are present measuring up to 1 cm. IMPRESSION: Impression 1. No acute fracture or traumatic subluxation of the cervical spine. 2. Mild degenerative changes. 3. Thyroid nodules measuring up to 1 cm. No follow-up imaging recommended. (Ref: J Am Coll Radiol. 2015 Feb;12(2): 143-50). Electronically Signed   By: Darliss Cheney M.D.   On: 06/16/2023 17:20   CT Head Wo Contrast Result Date: 06/16/2023 CLINICAL DATA:  Trauma. EXAM: CT HEAD WITHOUT CONTRAST TECHNIQUE: Contiguous axial images were obtained from the base of the skull through the vertex without  intravenous contrast. RADIATION DOSE REDUCTION: This exam was performed according to the departmental dose-optimization program which includes automated exposure control, adjustment of the mA and/or kV according to patient size and/or use of iterative reconstruction technique. COMPARISON:  None Available. FINDINGS: Brain: No evidence of acute infarction, hemorrhage, hydrocephalus, extra-axial collection or mass lesion/mass effect. There is mild periventricular white matter hypodensity, likely chronic small vessel ischemic change. Vascular: No hyperdense vessel or unexpected calcification. Skull: Normal. Negative for fracture or focal lesion. Sinuses/Orbits: No acute finding. Other: None. IMPRESSION: No acute intracranial abnormality. Electronically Signed   By: Darliss Cheney M.D.   On: 06/16/2023 16:41   DG Chest Port 1 View Result Date: 06/16/2023 CLINICAL DATA:  Questionable sepsis. EXAM: PORTABLE CHEST 1 VIEW COMPARISON:  None available. FINDINGS: Shallow inspiration. No focal consolidation or pneumothorax. Trace pleural effusions may be present. The cardiac silhouette is within limits. No acute osseous pathology. IMPRESSION: No focal consolidation. Trace pleural effusions may be present. Electronically Signed   By: Elgie Collard M.D.   On: 06/16/2023 16:29   DG HIP UNILAT WITH PELVIS 2-3 VIEWS RIGHT Result Date: 06/16/2023 CLINICAL DATA:  Fall. EXAM: DG HIP (WITH OR WITHOUT PELVIS) 2-3V RIGHT COMPARISON:  None Available. FINDINGS: Pelvis is intact with normal and symmetric sacroiliac joints. No acute fracture or dislocation. No aggressive osseous lesion. Visualized sacral arcuate lines are unremarkable. Unremarkable symphysis pubis. There are mild degenerative changes of bilateral hip joints without significant joint space narrowing. Osteophytosis of the superior acetabulum. No radiopaque foreign bodies. IMPRESSION: No acute osseous abnormality of the pelvis or right hip joint. Electronically Signed   By:  Rhea Belton  Jackquelyn Mass M.D.   On: 06/16/2023 16:29    Assessment/Plan Wound infection She has a leg wound with inflammation and drainage, likely from a fall on a hot Trex deck, suggesting bacterial infection. Doxycycline is chosen for its twice-daily dosing and good tolerance. Diarrhea due to gut flora disruption was discussed, and taking the antibiotic with food was emphasized. Improvement is expected within three days, and the full week of antibiotics may not be necessary. - Prescribe doxycycline twice daily - Reassess wound after three days to determine need for continued antibiotics - Provide fresh dressing for the wound - Consider probiotics or antidiarrheal medication if diarrhea occurs  Edema She presents with increased foot swelling, more pronounced than the last visit, which may impede knee wound healing and lead to blisters. Compression therapy is recommended, but previous discomfort with compression socks was likely due to incorrect sizing. Alternatives such as ace bandages were discussed. - Find appropriately sized compression socks - Use ace bandages if socks are intolerable - Apply compression to both legs to reduce swelling and promote healing  General Health Maintenance She skipped the COVID booster due to feeling unwell. The importance of the booster, especially for those over 65, was discussed, and it is recommended to consider it in the fall when she feels better. - Consider COVID booster in the fall when she feels better  Follow-up She is undergoing physical therapy five times a week and is considering transitioning to home care with therapy three times a week. Emphasis was placed on being strong enough before transitioning to avoid hospital readmissions. The decision for home transition will be guided by physical therapy's assessment of readiness. - Continue physical therapy five times a week until ready for home care transition - Coordinate with physical therapy to determine  readiness for home transition  Family/ staff Communication: nursing  Labs/tests ordered:  none   I spent greater than 30 minutes for the care of this patient in face to face time, chart review, clinical documentation, patient education.

## 2023-06-29 ENCOUNTER — Encounter: Payer: Self-pay | Admitting: Nurse Practitioner

## 2023-06-29 ENCOUNTER — Non-Acute Institutional Stay (SKILLED_NURSING_FACILITY): Payer: Self-pay | Admitting: Nurse Practitioner

## 2023-06-29 DIAGNOSIS — R600 Localized edema: Secondary | ICD-10-CM | POA: Diagnosis not present

## 2023-06-29 DIAGNOSIS — E46 Unspecified protein-calorie malnutrition: Secondary | ICD-10-CM | POA: Diagnosis not present

## 2023-06-29 NOTE — Progress Notes (Signed)
 Location:  Other [place].  Nursing Home Room Number: [facility] [address]A Place of Service:  SNF [phone]) Gilbert Lab, NP  PCP: Helaine Llanos, MD  Patient Care Team: Helaine Llanos, MD as PCP - General  Extended Emergency Contact Information Primary Emergency Contact: Light,Mollie  [place] Home Phone: [phone] Relation: Daughter Secondary Emergency Contact: Nour,Martivonne  [place] Home Phone: [phone] Relation: Niece  Goals of care: Advanced Directive information    06/21/2023    9:03 AM  Advanced Directives  Does Patient Have a Medical Advance Directive? Yes  Type of Advance Directive Out of facility DNR (pink MOST or yellow form)  Does patient want to make changes to medical advance directive? No - Patient declined     Chief Complaint  Patient presents with   Edema    Lower Extremity Edema    HPI:  Pt is a 89 y.o. female seen today for an acute visit for Lower Extremity Edema.   She experiences swelling in her legs and has not been compression stockings. She finds them uncomfortable when too tight but has recently obtained a larger size that is more tolerable. She elevates her legs while sitting as part of her management strategy.  Her protein levels have been very low. She is not currently taking a protein supplement. She enjoys eating country ham and other salty foods  No shortness of breath or chest pain.   Past Medical History:  Diagnosis Date   Diverticulosis    GERD (gastroesophageal reflux disease)    Hyperlipidemia    Hypertension    Osteoporosis    Polymyalgia rheumatica (HCC)    Wears dentures    partial upper and lower   Wears hearing aid in both ears    Past Surgical History:  Procedure Laterality Date   ANKLE FRACTURE SURGERY  07/12/2005   CATARACT EXTRACTION  2012   right   CATARACT EXTRACTION W/PHACO Left 09/07/2020   Procedure: CATARACT EXTRACTION PHACO AND INTRAOCULAR LENS PLACEMENT  (IOC) LEFT 8.30 01:09.7;  Surgeon: Annell Kidney, MD;  Location: [facility];  Service: Ophthalmology;  Laterality: Left;  requests early as possible   TONSILLECTOMY      Allergies  Allergen Reactions   Alendronate Sodium     REACTION: GI    Outpatient Encounter Medications as of 06/29/2023  Medication Sig   acetaminophen (TYLENOL) 500 MG tablet Take 1,000 mg by mouth every 8 (eight) hours as needed.   doxycycline (ADOXA) 100 MG tablet Take 100 mg by mouth 2 (two) times daily.   esomeprazole (NEXIUM) 20 MG capsule Take 20 mg by mouth daily at 12 noon.   glucose blood (TRUE METRIX BLOOD GLUCOSE TEST) test strip Use to check blood sugar once daily   losartan (COZAAR) 25 MG tablet Take 1 tablet (25 mg total) by mouth daily.   melatonin 5 MG TABS Take 5 mg by mouth at bedtime as needed.   Multiple Vitamin (MULTIVITAMIN) capsule Take 1 capsule by mouth daily.   Naproxen Sodium 220 MG CAPS Take 1 capsule by mouth 2 (two) times daily as needed. Take one by mouth every 12 hours as needed.   polyethylene glycol (MIRALAX / GLYCOLAX) 17 g packet Take 17 g by mouth daily as needed for mild constipation.   prednisoLONE acetate (PRED FORTE) 1 % ophthalmic suspension Place 1 drop into both eyes at bedtime.   TRUEplus Lancets 33G MISC Use to obtain blood sugar sample once daily   UNABLE  TO FIND Med Name: CBD Oil Give 0.62ml by mouth at bedtime   No facility-administered encounter medications on file as of 06/29/2023.    Review of Systems  Constitutional:  Negative for activity change, appetite change, fatigue and unexpected weight change.  HENT:  Negative for congestion and hearing loss.   Eyes: Negative.   Respiratory:  Negative for cough and shortness of breath.   Cardiovascular:  Positive for leg swelling. Negative for chest pain and palpitations.  Gastrointestinal:  Negative for abdominal pain, constipation and diarrhea.  Genitourinary:  Negative for difficulty urinating and  dysuria.  Musculoskeletal:  Negative for arthralgias and myalgias.  Skin:  Negative for color change and wound.  Neurological:  Positive for weakness. Negative for dizziness.  Psychiatric/Behavioral:  Negative for agitation, behavioral problems and confusion.     Immunization History  Administered Date(s) Administered   Fluad Quad(high Dose 65+) 11/12/2018, 11/06/2020   Influenza Split 12/13/2010, 12/08/2011   Influenza Whole 12/26/2006, 12/25/2008   Influenza, High Dose Seasonal PF 12/17/2014, 11/26/2015, 12/15/2016, 12/06/2017   Influenza,inj,Quad PF,6+ Mos 12/11/2012   Influenza-Unspecified 11/22/2013, 12/15/2016, 12/13/2019, 12/28/2021, 01/05/2023   Moderna SARS-COV2 Booster Vaccination 12/27/2019, 07/26/2020   Moderna Sars-Covid-2 Vaccination 03/28/2019, 04/25/2019   Pneumococcal Conjugate-13 04/29/2014   Pneumococcal Polysaccharide-23 03/14/2004, 08/09/2017   Td 07/09/2002   Unspecified SARS-COV-2 Vaccination 01/21/2022, 06/21/2022, 12/09/2022   Zoster, Live 05/03/2006   Pertinent  Health Maintenance Due  Topic Date Due   INFLUENZA VACCINE  10/13/2023   DEXA SCAN  Completed      11/06/2020    9:38 AM 01/06/2022   12:08 PM 08/19/2022    1:52 PM 01/06/2023    8:02 AM 01/06/2023    8:25 AM  Fall Risk  Falls in the past year? 0 0 0 0 0  Was there an injury with Fall?   0 0   Fall Risk Category Calculator   0 0   Patient at Risk for Falls Due to   No Fall Risks No Fall Risks   Fall risk Follow up   Falls evaluation completed Falls evaluation completed    Functional Status Survey:    Vitals:   06/29/23 0840 06/29/23 0845  BP: (!) 165/72 (!) 150/69  Pulse: 81   Resp: (!) 21   Temp: (!) 97.5 F (36.4 C)   SpO2: 95%   Weight: 125 lb 9.6 oz (57 kg)   Height: 4\' 9"  (1.448 m)    Body mass index is 27.18 kg/m. Physical Exam Constitutional:      General: She is not in acute distress.    Appearance: She is well-developed. She is not diaphoretic.  HENT:     Head:  Normocephalic and atraumatic.     Mouth/Throat:     Pharynx: No oropharyngeal exudate.  Eyes:     Conjunctiva/sclera: Conjunctivae normal.     Pupils: Pupils are equal, round, and reactive to light.  Cardiovascular:     Rate and Rhythm: Normal rate and regular rhythm.     Heart sounds: Normal heart sounds.  Pulmonary:     Effort: Pulmonary effort is normal.     Breath sounds: Normal breath sounds.  Abdominal:     General: Bowel sounds are normal.     Palpations: Abdomen is soft.  Musculoskeletal:     Cervical back: Normal range of motion and neck supple.     Right lower leg: Edema present.     Left lower leg: Edema present.  Skin:    General: Skin is  warm and dry.  Neurological:     Mental Status: She is alert and oriented to person, place, and time.  Psychiatric:        Mood and Affect: Mood normal.     Labs reviewed: Recent Labs    06/16/23 1447 06/17/23 0242 06/18/23 0542 06/22/23 0000  NA 136 136 134* 138  K 2.8* 3.9 4.4 3.7  CL 100 104 106 106  CO2 22 19* 25 23*  GLUCOSE 112* 92 107*  --   BUN 23 27* 23 11  CREATININE 0.89 0.92 0.77 0.6  CALCIUM 9.7 9.5 8.9 9.1  MG 2.0  --   --   --    Recent Labs    06/16/23 1447 06/17/23 0242 06/18/23 0542  AST 306* 235* 135*  ALT 86* 74* 61*  ALKPHOS 40 39 35*  BILITOT 2.0* 1.4* 0.9  PROT 6.4* 5.8* 4.9*  ALBUMIN 3.8 3.3* 2.8*   Recent Labs    06/16/23 1447 06/17/23 0242 06/18/23 0542 06/18/23 1409 06/22/23 0000  WBC 18.2* 17.1* 9.8 10.9* 5.3  NEUTROABS 16.3*  --   --   --  3,360.00  HGB 14.5 13.1 10.8* 11.7* 11.4*  HCT 40.0 36.4 30.5* 32.5* 34*  MCV 87.9 89.7 89.7 89.3  --   PLT 192 174 139* 140* 231   Lab Results  Component Value Date   TSH 1.14 04/26/2013   Lab Results  Component Value Date   HGBA1C 5.2 06/16/2023   Lab Results  Component Value Date   CHOL 178 06/17/2023   HDL 82 06/17/2023   LDLCALC 81 06/17/2023   LDLDIRECT 116.2 04/25/2012   TRIG 73 06/17/2023   CHOLHDL 2.2 06/17/2023     Significant Diagnostic Results in last 30 days:  ECHOCARDIOGRAM COMPLETE Result Date: 06/17/2023    ECHOCARDIOGRAM REPORT   Patient Name:   RUBY LOGIUDICE Date of Exam: 06/17/2023 Medical Rec #:  [id number]      Height:       58.0 in Accession #:    [id number]     Weight:       123.2 lb Date of Birth:  Jul 15, 1923      BSA:          1.482 m Patient Age:    89 years       BP:           123/54 mmHg Patient Gender: F              HR:           79 bpm. Exam Location:  [facility] Procedure: 2D Echo, Strain Analysis, Color Doppler and Cardiac Doppler (Both            Spectral and Color Flow Doppler were utilized during procedure). Indications:     Abnormal ECG R94.31  History:         Patient has no prior history of Echocardiogram examinations.  Sonographer:     Overton Mam RDCS, FASE Referring Phys:  [id number] AMIN Diagnosing Phys: Debbe Odea MD IMPRESSIONS  1. Left ventricular ejection fraction, by estimation, is 60 to 65%. The left ventricle has normal function. The left ventricle has no regional wall motion abnormalities. There is mild left ventricular hypertrophy. Left ventricular diastolic parameters are consistent with Grade I diastolic dysfunction (impaired relaxation).  2. Right ventricular systolic function is normal. The right ventricular size is normal.  3. Left atrial size was mildly dilated.  4. The mitral valve is normal in structure.  Mild mitral valve regurgitation.  5. The aortic valve is tricuspid. Aortic valve regurgitation is not visualized. Aortic valve sclerosis is present, with no evidence of aortic valve stenosis.  6. The inferior vena cava is normal in size with greater than 50% respiratory variability, suggesting right atrial pressure of 3 mmHg. FINDINGS  Left Ventricle: Left ventricular ejection fraction, by estimation, is 60 to 65%. The left ventricle has normal function. The left ventricle has no regional wall motion abnormalities. Global longitudinal strain performed but not  reported based on interpreter judgement due to suboptimal tracking. The left ventricular internal cavity size was normal in size. There is mild left ventricular hypertrophy. Left ventricular diastolic parameters are consistent with Grade I diastolic dysfunction (impaired relaxation). Right Ventricle: The right ventricular size is normal. No increase in right ventricular wall thickness. Right ventricular systolic function is normal. Left Atrium: Left atrial size was mildly dilated. Right Atrium: Right atrial size was normal in size. Pericardium: There is no evidence of pericardial effusion. Mitral Valve: The mitral valve is normal in structure. Mild mitral valve regurgitation. Tricuspid Valve: The tricuspid valve is normal in structure. Tricuspid valve regurgitation is mild. Aortic Valve: The aortic valve is tricuspid. Aortic valve regurgitation is not visualized. Aortic valve sclerosis is present, with no evidence of aortic valve stenosis. Aortic valve peak gradient measures 8.4 mmHg. Pulmonic Valve: The pulmonic valve was normal in structure. Pulmonic valve regurgitation is trivial. Aorta: The aortic root and ascending aorta are structurally normal, with no evidence of dilitation. Venous: The inferior vena cava is normal in size with greater than 50% respiratory variability, suggesting right atrial pressure of 3 mmHg. IAS/Shunts: No atrial level shunt detected by color flow Doppler.  LEFT VENTRICLE PLAX 2D LVIDd:         3.80 cm     Diastology LVIDs:         1.70 cm     LV e' medial:    4.03 cm/s LV PW:         1.20 cm     LV E/e' medial:  16.4 LV IVS:        0.90 cm     LV e' lateral:   3.81 cm/s LVOT diam:     2.00 cm     LV E/e' lateral: 17.3 LV SV:         58 LV SV Index:   39 LVOT Area:     3.14 cm  LV Volumes (MOD) LV vol d, MOD A4C: 57.3 ml LV vol s, MOD A4C: 17.5 ml LV SV MOD A4C:     57.3 ml RIGHT VENTRICLE RV Basal diam:  2.60 cm RV S prime:     17.30 cm/s TAPSE (M-mode): 1.9 cm LEFT ATRIUM            Index        RIGHT ATRIUM           Index LA diam:      2.90 cm 1.96 cm/m   RA Area:     12.80 cm LA Vol (A2C): 25.3 ml 17.07 ml/m  RA Volume:   29.10 ml  19.63 ml/m LA Vol (A4C): 50.8 ml 34.27 ml/m  AORTIC VALVE                 PULMONIC VALVE AV Area (Vmax): 2.51 cm     PV Vmax:          1.35 m/s AV Vmax:  145.00 cm/s  PV Peak grad:     7.3 mmHg AV Peak Grad:   8.4 mmHg     PR End Diast Vel: 5.66 msec LVOT Vmax:      116.00 cm/s  RVOT Peak grad:   8 mmHg LVOT Vmean:     82.400 cm/s LVOT VTI:       0.185 m  AORTA Ao Root diam: 2.90 cm Ao Asc diam:  2.80 cm MITRAL VALVE                TRICUSPID VALVE MV Area (PHT): 4.68 cm     TR Peak grad:   26.6 mmHg MV Decel Time: 162 msec     TR Vmax:        258.00 cm/s MV E velocity: 66.10 cm/s MV A velocity: 106.00 cm/s  SHUNTS MV E/A ratio:  0.62         Systemic VTI:  0.18 m                             Systemic Diam: 2.00 cm Constancia Delton MD Electronically signed by Constancia Delton MD Signature Date/Time: 06/17/2023/9:46:34 AM    Final    CT Cervical Spine Wo Contrast Result Date: 06/16/2023 CLINICAL DATA:  Facial trauma. EXAM: CT CERVICAL SPINE WITHOUT CONTRAST TECHNIQUE: Multidetector CT imaging of the cervical spine was performed without intravenous contrast. Multiplanar CT image reconstructions were also generated. RADIATION DOSE REDUCTION: This exam was performed according to the departmental dose-optimization program which includes automated exposure control, adjustment of the mA and/or kV according to patient size and/or use of iterative reconstruction technique. COMPARISON:  None Available. FINDINGS: Alignment: Normal. Skull base and vertebrae: No acute fracture. No primary bone lesion or focal pathologic process. Soft tissues and spinal canal: No prevertebral fluid or swelling. No visible canal hematoma. Disc levels: There is disc space narrowing and endplate osteophyte formation throughout the cervical spine most significant at C5-C6 and C6-C7. No  significant central canal or neural foraminal stenosis at any level. Upper chest: Negative. Other: Bilateral thyroid nodules are present measuring up to 1 cm. IMPRESSION: Impression 1. No acute fracture or traumatic subluxation of the cervical spine. 2. Mild degenerative changes. 3. Thyroid nodules measuring up to 1 cm. No follow-up imaging recommended. (Ref: J Am Coll Radiol. 2015 Feb;12(2): 143-50). Electronically Signed   By: Tyron Gallon M.D.   On: 06/16/2023 17:20   CT Head Wo Contrast Result Date: 06/16/2023 CLINICAL DATA:  Trauma. EXAM: CT HEAD WITHOUT CONTRAST TECHNIQUE: Contiguous axial images were obtained from the base of the skull through the vertex without intravenous contrast. RADIATION DOSE REDUCTION: This exam was performed according to the departmental dose-optimization program which includes automated exposure control, adjustment of the mA and/or kV according to patient size and/or use of iterative reconstruction technique. COMPARISON:  None Available. FINDINGS: Brain: No evidence of acute infarction, hemorrhage, hydrocephalus, extra-axial collection or mass lesion/mass effect. There is mild periventricular white matter hypodensity, likely chronic small vessel ischemic change. Vascular: No hyperdense vessel or unexpected calcification. Skull: Normal. Negative for fracture or focal lesion. Sinuses/Orbits: No acute finding. Other: None. IMPRESSION: No acute intracranial abnormality. Electronically Signed   By: Tyron Gallon M.D.   On: 06/16/2023 16:41   DG Chest Port 1 View Result Date: 06/16/2023 CLINICAL DATA:  Questionable sepsis. EXAM: PORTABLE CHEST 1 VIEW COMPARISON:  None available. FINDINGS: Shallow inspiration. No focal consolidation or pneumothorax. Trace pleural effusions may be  present. The cardiac silhouette is within limits. No acute osseous pathology. IMPRESSION: No focal consolidation. Trace pleural effusions may be present. Electronically Signed   By: Angus Bark M.D.   On:  06/16/2023 16:29   DG HIP UNILAT WITH PELVIS 2-3 VIEWS RIGHT Result Date: 06/16/2023 CLINICAL DATA:  Fall. EXAM: DG HIP (WITH OR WITHOUT PELVIS) 2-3V RIGHT COMPARISON:  None Available. FINDINGS: Pelvis is intact with normal and symmetric sacroiliac joints. No acute fracture or dislocation. No aggressive osseous lesion. Visualized sacral arcuate lines are unremarkable. Unremarkable symphysis pubis. There are mild degenerative changes of bilateral hip joints without significant joint space narrowing. Osteophytosis of the superior acetabulum. No radiopaque foreign bodies. IMPRESSION: No acute osseous abnormality of the pelvis or right hip joint. Electronically Signed   By: Beula Brunswick M.D.   On: 06/16/2023 16:29    Assessment/Plan  1. Bilateral leg edema (Primary) - Encouraged regular use of compression stockings. Previously did not wish to wear but not willing due to obtaining proper size - Advised leg elevation when sitting. - Recommended low sodium diet.  2. Protein-calorie malnutrition, unspecified severity (HCC) Low albumin and protein levels contributing to edema.  - Initiated protein supplement daily   Sebrena Engh K. Denney Fisherman [facility] [phone]

## 2023-07-04 ENCOUNTER — Ambulatory Visit: Admitting: Medical

## 2023-07-11 ENCOUNTER — Ambulatory Visit: Attending: Medical | Admitting: Medical

## 2023-07-11 ENCOUNTER — Encounter: Payer: Self-pay | Admitting: Medical

## 2023-07-11 VITALS — BP 170/68 | HR 72 | Resp 16 | Ht <= 58 in | Wt 135.0 lb

## 2023-07-11 DIAGNOSIS — E782 Mixed hyperlipidemia: Secondary | ICD-10-CM | POA: Diagnosis not present

## 2023-07-11 DIAGNOSIS — Z79899 Other long term (current) drug therapy: Secondary | ICD-10-CM

## 2023-07-11 DIAGNOSIS — I5031 Acute diastolic (congestive) heart failure: Secondary | ICD-10-CM

## 2023-07-11 DIAGNOSIS — I214 Non-ST elevation (NSTEMI) myocardial infarction: Secondary | ICD-10-CM | POA: Diagnosis not present

## 2023-07-11 DIAGNOSIS — I10 Essential (primary) hypertension: Secondary | ICD-10-CM | POA: Diagnosis not present

## 2023-07-11 DIAGNOSIS — E876 Hypokalemia: Secondary | ICD-10-CM | POA: Diagnosis not present

## 2023-07-11 DIAGNOSIS — R600 Localized edema: Secondary | ICD-10-CM

## 2023-07-11 MED ORDER — SPIRONOLACTONE 25 MG PO TABS: 25.0000 mg | ORAL_TABLET | Freq: Every day | ORAL | 3 refills | Status: DC

## 2023-07-11 NOTE — Patient Instructions (Signed)
 Medication Instructions:  Your physician recommends the following medication changes.  START TAKING: Spironolactone 25 mg once daily    *If you need a refill on your cardiac medications before your next appointment, please call your pharmacy*  Lab Work: Your provider would like for you to return in 2 weeks to have the following labs drawn: BMet.   Please go to [facility] [address] ([facility] [address], [place] [address] You do not need an appointment.  They are open from 8 am- 4:30 pm.  Lunch from 1:00 pm- 2:00 pm You DO NOT need to be fasting.   You may also go to one of the following [facility]:  [address] [place], [place] [address] Phone: [phone] Lab hours: Mon-Fri 8 am- 5 pm    Lunch 12 pm- 1 pm  [address] [place],  [place]  [address]  [place]  Phone: [phone] Lab hours: 7 am- 4 pm Lunch 12 pm-1 pm   [address] [place],  [place]  [address]  [place]  Phone: [phone] Lab hours: Mon-Fri 8 am- 5 pm    Lunch 12 pm- 1 pm  If you have labs (blood work) drawn today and your tests are completely normal, you will receive your results only by: MyChart Message (if you have MyChart) OR A paper copy in the mail If you have any lab test that is abnormal or we need to change your treatment, we will call you to review the results.    Follow-Up: At [facility], you and your health needs are our priority.  As part of our continuing mission to provide you with exceptional heart care, our providers are all part of one team.  This team includes your primary Cardiologist (physician) and Advanced Practice Providers or APPs (Physician Assistants and Nurse Practitioners) who all work together to provide you with the care you need, when you need it.  Your next appointment:   1-2 month(s)  Provider:    Cadence Gennaro Khat, PA-C  We recommend signing up for the patient portal called "MyChart".  Sign up information is provided on this After Visit  Summary.  MyChart is used to connect with patients for Virtual Visits (Telemedicine).  Patients are able to view lab/test results, encounter notes, upcoming appointments, etc.  Non-urgent messages can be sent to your provider as well.   To learn more about what you can do with MyChart, go to [email or web address].

## 2023-07-11 NOTE — Progress Notes (Signed)
 Cardiology Office Note:  .   Date:  07/11/2023  ID:  Ann Odom, DOB Aug 27, 1923, MRN [id number] PCP: Valrie Gehrig, MD  [facility] Providers Cardiologist:  None {  History of Present Illness: Ann Odom   Ann Odom is a 89 y.o. female with a hx of HTN, osteoporosis, GERD, HLD, HFpEF who is being seen 06/17/2023 for the evaluation of hospital follow-up.   The patient presented to the ER 06/17/2023 with loss of consciousness.  She was found facedown by her neighbor.  Her daughter was trying to reach her but could not.  Per daughter reports she would have been out for 3 to 4 hours.  Patient denied any chest pain, shortness of breath, palpitations, lightheadedness or dizziness.  She remembers feeling weak.  In the ER K was 2.8, AST 306, ALT 86, WBC 18.2, high-sensitivity troponin 7590> 8231, lactic acid 2.6, BNP 962, CK8 918, CT of the head was negative.  EKG showed sinus rhythm, right bundle branch block, minimal ST lateral leads with T wave inversion in V1 through V4.  She was started on IV heparin .  Patient was transition to Lovenox  for completion of anticoagulation.  She was started on aspirin  81 mg daily.  Echo showed normal EF.  Suspect degree of supply/demand mismatch.  Given age, lack of symptoms, and comorbidities she was treated conservatively.  Today, the patient is accompanied by her daughter. Bp at home 155/60s. She is off hydrochlorothiazide. She has moderate lower leg edema. She also has arthritic pain everywhere. She has no chest pain or SOB. She can walk with a rollator. She can perform most ADLs. Weight is up 135lbs.   Studies Reviewed: .       Echo 06/17/23 1. Left ventricular ejection fraction, by estimation, is 60 to 65%. The  left ventricle has normal function. The left ventricle has no regional  wall motion abnormalities. There is mild left ventricular hypertrophy.  Left ventricular diastolic parameters  are consistent with Grade I diastolic dysfunction (impaired  relaxation).   2. Right ventricular systolic function is normal. The right ventricular  size is normal.   3. Left atrial size was mildly dilated.   4. The mitral valve is normal in structure. Mild mitral valve  regurgitation.   5. The aortic valve is tricuspid. Aortic valve regurgitation is not  visualized. Aortic valve sclerosis is present, with no evidence of aortic  valve stenosis.   6. The inferior vena cava is normal in size with greater than 50%  respiratory variability, suggesting right atrial pressure of 3 mmHg.        Physical Exam:   VS:  BP (!) 170/68 (BP Location: Left Arm, Patient Position: Sitting, Cuff Size: Normal)   Pulse 72   Resp 16   Ht 4\' 9"  (1.448 m)   Wt 135 lb (61.2 kg)   SpO2 98%   BMI 29.21 kg/m    Wt Readings from Last 3 Encounters:  07/11/23 135 lb (61.2 kg)  06/29/23 125 lb 9.6 oz (57 kg)  06/26/23 125 lb 9.6 oz (57 kg)    GEN: Well nourished, well developed in no acute distress NECK: No JVD; No carotid bruits CARDIAC: RRR, no murmurs, rubs, gallops RESPIRATORY:  Clear to auscultation without rales, wheezing or rhonchi  ABDOMEN: Soft, non-tender, non-distended EXTREMITIES:  moderate lower leg edema; No deformity   ASSESSMENT AND PLAN: .    NSETMI Recent admission after being found down by a neighbor treated for NSTEMI, rhabdomyolysis, lactic acidosis,  hypokalemia, transaminitis. No chest pain was reported. Echo showed normal EF. She was treated with IV heparin /Lovenox  for 48 hours. Given patient's age, normal EF no further ischemic work-up was pursued. The patient was discharged to [facility] and is overall improving. The patient had an episode of chest pain in the setting of stress. No further ischemic work-up at this time. Continue ASA 81mg  daily.    LLE Acute diastolic heart failure Hydrochlorothiazide stopped in the hospital. Since then she has had worsening lower leg edema. Weight is up 10 lbs. Some may be third spacing given low albumin. I  will start spironolactone 25mg  daily. BMET in 2 weeks. Echo 06/17/23 showed LVEF 60-65%, no WMA, mild LVH, G1DD, mild MR.   HTN BP is high. She is off hydrochlorothiazide due to hypokalemia. I will start spiro 12.5mg  daily. Continue Losartan  25mg  daily. BMET in 2 weeks.   Hypokalemia Resolved on mos recent labs  HLD LDL 81. She is not on cholesterol medication at baseline due to rhabdomyolysis and transaminitis.       Dispo: follow-up in 1-2 months  Signed, Kaysi Ourada Rebekah Canada, PA-C

## 2023-07-12 ENCOUNTER — Encounter: Payer: Self-pay | Admitting: Student

## 2023-07-12 ENCOUNTER — Non-Acute Institutional Stay (SKILLED_NURSING_FACILITY): Payer: Self-pay | Admitting: Student

## 2023-07-12 DIAGNOSIS — R350 Frequency of micturition: Secondary | ICD-10-CM

## 2023-07-12 DIAGNOSIS — M6282 Rhabdomyolysis: Secondary | ICD-10-CM | POA: Diagnosis not present

## 2023-07-12 DIAGNOSIS — M130 Polyarthritis, unspecified: Secondary | ICD-10-CM | POA: Diagnosis not present

## 2023-07-12 DIAGNOSIS — I10 Essential (primary) hypertension: Secondary | ICD-10-CM | POA: Diagnosis not present

## 2023-07-12 NOTE — Progress Notes (Signed)
 Location:  Other [place].  Nursing Home Room Number: [facility] 115A Place of Service:  SNF [phone]) Provider:  Valrie Gehrig, MD  Patient Care Team: Valrie Gehrig, MD as PCP - General [facility])  Extended Emergency Contact Information Primary Emergency Contact: Light,Mollie  [place] Home Phone: [phone] Relation: Daughter Secondary Emergency Contact: Nour,Martivonne  [place] Home Phone: [phone] Relation: Niece  Code Status:  DNR Goals of care: Advanced Directive information    06/21/2023    9:03 AM  Advanced Directives  Does Patient Have a Medical Advance Directive? Yes  Type of Advance Directive Out of facility DNR (pink MOST or yellow form)  Does patient want to make changes to medical advance directive? No - Patient declined     Chief Complaint  Patient presents with   Discharge Note    Discharge.     HPI:  Pt is a 89 y.o. female seen today for Discharge.  History of Present Illness The patient presents with generalized pain and frequent nighttime urination.  She experiences generalized pain that worsens at night, described as moving around and affecting areas such as her shoulder, back, and leg. She uses hot packs for relief and takes Tylenol  in the evening, sometimes followed by Aleve if the pain persists. This pain is new, but she recalls a similar experience over twenty years ago after her father's death, which was diagnosed as monomyalgia and treated with prednisone.  She experiences frequent nighttime urination, needing to get up multiple times during the night. She has a pessary to help hold her bladder in place, but it does not alleviate her urinary symptoms.  Her daily routine includes getting up early, making coffee, and engaging in activities such as scrapbooking and journaling. She enjoys socializing with friends and sitting on the front porch. 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  She receives massages at home and now more frequently while in care, which she finds beneficial for her swelling and pain. She is also taking a protein supplement every morning, though she is unsure if it is prescribed or over-the-counter.   Past Medical History:  Diagnosis Date   Diverticulosis    GERD (gastroesophageal reflux disease)    Hyperlipidemia    Hypertension    Osteoporosis    Polymyalgia rheumatica (HCC)    Wears dentures    partial upper and lower   Wears hearing aid in both ears    Past Surgical History:  Procedure Laterality Date   ANKLE FRACTURE SURGERY  07/12/2005   CATARACT EXTRACTION  2012   right   CATARACT EXTRACTION W/PHACO Left 09/07/2020   Procedure: CATARACT EXTRACTION PHACO AND INTRAOCULAR LENS PLACEMENT (IOC) LEFT 8.30 01:09.7;  Surgeon: Annell Kidney, MD;  Location: [facility];  Service: Ophthalmology;  Laterality: Left;  requests early as possible   TONSILLECTOMY      Allergies  Allergen Reactions   Alendronate Sodium     REACTION: GI    Outpatient Encounter Medications as of 07/12/2023  Medication Sig   acetaminophen  (TYLENOL ) 500 MG tablet Take 1,000 mg by mouth every 8 (eight) hours as needed.   esomeprazole (NEXIUM) 20 MG capsule Take 20 mg by mouth daily at 12 noon.   glucose blood (TRUE METRIX BLOOD GLUCOSE TEST) test strip Use to check blood sugar once daily   losartan  (COZAAR ) 25 MG tablet Take 1 tablet (25 mg total) by mouth  daily.   melatonin 5 MG TABS Take 5 mg by mouth at bedtime as needed.   Multiple Vitamin (MULTIVITAMIN) capsule Take 1 capsule by mouth daily.   Naproxen Sodium 220 MG CAPS Take 1 capsule by mouth 2 (two) times daily as needed.   polyethylene glycol (MIRALAX  / GLYCOLAX ) 17 g packet Take 17 g by mouth daily as needed for mild constipation.   prednisoLONE acetate (PRED FORTE) 1 % ophthalmic suspension Place 1 drop into both eyes at bedtime.    TRUEplus Lancets 33G MISC Use to obtain blood sugar sample once daily   UNABLE TO FIND Med Name: CBD Oil Give 0.75ml by mouth at bedtime   spironolactone (ALDACTONE) 25 MG tablet Take 1 tablet (25 mg total) by mouth daily. (Patient not taking: Reported on 07/12/2023)   No facility-administered encounter medications on file as of 07/12/2023.    Review of Systems  Immunization History  Administered Date(s) Administered   Fluad Quad(high Dose 65+) 11/12/2018, 11/06/2020   Influenza Split 12/13/2010, 12/08/2011   Influenza Whole 12/26/2006, 12/25/2008   Influenza, High Dose Seasonal PF 12/17/2014, 11/26/2015, 12/15/2016, 12/06/2017   Influenza,inj,Quad PF,6+ Mos 12/11/2012   Influenza-Unspecified 11/22/2013, 12/15/2016, 12/13/2019, 12/28/2021, 01/05/2023   Moderna SARS-COV2 Booster Vaccination 12/27/2019, 07/26/2020   Moderna Sars-Covid-2 Vaccination 03/28/2019, 04/25/2019   Pneumococcal Conjugate-13 04/29/2014   Pneumococcal Polysaccharide-23 03/14/2004, 08/09/2017   Td 07/09/2002   Unspecified SARS-COV-2 Vaccination 01/21/2022, 06/21/2022, 12/09/2022   Zoster, Live 05/03/2006   Pertinent  Health Maintenance Due  Topic Date Due   INFLUENZA VACCINE  10/13/2023   DEXA SCAN  Completed      11/06/2020    9:38 AM 01/06/2022   12:08 PM 08/19/2022    1:52 PM 01/06/2023    8:02 AM 01/06/2023    8:25 AM  Fall Risk  Falls in the past year? 0 0 0 0 0  Was there an injury with Fall?   0 0   Fall Risk Category Calculator   0 0   Patient at Risk for Falls Due to   No Fall Risks No Fall Risks   Fall risk Follow up   Falls evaluation completed Falls evaluation completed    Functional Status Survey:    Vitals:   07/12/23 0836 07/12/23 0842  BP: (!) 182/78 (!) 170/74  Pulse: 78   Resp: (!) 22   Temp: (!) 97.4 F (36.3 C)   SpO2: 93%   Weight: 137 lb 3.2 oz (62.2 kg)   Height: 4\' 9"  (1.448 m)    Body mass index is 29.69 kg/m. Physical Exam Constitutional:      Comments: Thin, frail   Cardiovascular:     Rate and Rhythm: Normal rate and regular rhythm.     Pulses: Normal pulses.     Heart sounds: Normal heart sounds.  Pulmonary:     Effort: Pulmonary effort is normal.  Abdominal:     General: Abdomen is flat. Bowel sounds are normal.     Palpations: Abdomen is soft.  Musculoskeletal:        General: No tenderness.  Skin:    General: Skin is warm and dry.  Neurological:     Mental Status: She is alert and oriented to person, place, and time.     Gait: Gait normal.  Psychiatric:        Mood and Affect: Mood normal.     Labs reviewed: Recent Labs    06/16/23 1447 06/17/23 0242 06/18/23 0542 06/22/23 0000  NA 136 136 134*  138  K 2.8* 3.9 4.4 3.7  CL 100 104 106 106  CO2 22 19* 25 23*  GLUCOSE 112* 92 107*  --   BUN 23 27* 23 11  CREATININE 0.89 0.92 0.77 0.6  CALCIUM 9.7 9.5 8.9 9.1  MG 2.0  --   --   --    Recent Labs    06/16/23 1447 06/17/23 0242 06/18/23 0542  AST 306* 235* 135*  ALT 86* 74* 61*  ALKPHOS 40 39 35*  BILITOT 2.0* 1.4* 0.9  PROT 6.4* 5.8* 4.9*  ALBUMIN 3.8 3.3* 2.8*   Recent Labs    06/16/23 1447 06/17/23 0242 06/18/23 0542 06/18/23 1409 06/22/23 0000  WBC 18.2* 17.1* 9.8 10.9* 5.3  NEUTROABS 16.3*  --   --   --  3,360.00  HGB 14.5 13.1 10.8* 11.7* 11.4*  HCT 40.0 36.4 30.5* 32.5* 34*  MCV 87.9 89.7 89.7 89.3  --   PLT 192 174 139* 140* 231   Lab Results  Component Value Date   TSH 1.14 04/26/2013   Lab Results  Component Value Date   HGBA1C 5.2 06/16/2023   Lab Results  Component Value Date   CHOL 178 06/17/2023   HDL 82 06/17/2023   LDLCALC 81 06/17/2023   LDLDIRECT 116.2 04/25/2012   TRIG 73 06/17/2023   CHOLHDL 2.2 06/17/2023    Significant Diagnostic Results in last 30 days:  ECHOCARDIOGRAM COMPLETE Result Date: 06/17/2023    ECHOCARDIOGRAM REPORT   [name] Date of Exam: 06/17/2023 Medical Rec #:  [id number]      Height:       58.0 in Accession #:    [id number]     Weight:        123.2 lb Date of Birth:  [birth year]      BSA:          1.482 m Patient Age:    89 years       BP:           123/54 mmHg Patient Gender: F              HR:           79 bpm. Exam Location:  [facility] Procedure: 2D Echo, Strain Analysis, Color Doppler and Cardiac Doppler (Both            Spectral and Color Flow Doppler were utilized during procedure). Indications:     Abnormal ECG R94.31  History:         Patient has no prior history of Echocardiogram examinations.  Sonographer:     Clenton Czech RDCS, FASE Referring Phys:  [id number] AMIN Diagnosing Phys: Constancia Delton MD IMPRESSIONS  1. Left ventricular ejection fraction, by estimation, is 60 to 65%. The left ventricle has normal function. The left ventricle has no regional wall motion abnormalities. There is mild left ventricular hypertrophy. Left ventricular diastolic parameters are consistent with Grade I diastolic dysfunction (impaired relaxation).  2. Right ventricular systolic function is normal. The right ventricular size is normal.  3. Left atrial size was mildly dilated.  4. The mitral valve is normal in structure. Mild mitral valve regurgitation.  5. The aortic valve is tricuspid. Aortic valve regurgitation is not visualized. Aortic valve sclerosis is present, with no evidence of aortic valve stenosis.  6. The inferior vena cava is normal in size with greater than 50% respiratory variability, suggesting right atrial pressure of 3 mmHg. FINDINGS  Left Ventricle: Left ventricular ejection  fraction, by estimation, is 60 to 65%. The left ventricle has normal function. The left ventricle has no regional wall motion abnormalities. Global longitudinal strain performed but not reported based on interpreter judgement due to suboptimal tracking. The left ventricular internal cavity size was normal in size. There is mild left ventricular hypertrophy. Left ventricular diastolic parameters are consistent with Grade I diastolic dysfunction (impaired  relaxation). Right Ventricle: The right ventricular size is normal. No increase in right ventricular wall thickness. Right ventricular systolic function is normal. Left Atrium: Left atrial size was mildly dilated. Right Atrium: Right atrial size was normal in size. Pericardium: There is no evidence of pericardial effusion. Mitral Valve: The mitral valve is normal in structure. Mild mitral valve regurgitation. Tricuspid Valve: The tricuspid valve is normal in structure. Tricuspid valve regurgitation is mild. Aortic Valve: The aortic valve is tricuspid. Aortic valve regurgitation is not visualized. Aortic valve sclerosis is present, with no evidence of aortic valve stenosis. Aortic valve peak gradient measures 8.4 mmHg. Pulmonic Valve: The pulmonic valve was normal in structure. Pulmonic valve regurgitation is trivial. Aorta: The aortic root and ascending aorta are structurally normal, with no evidence of dilitation. Venous: The inferior vena cava is normal in size with greater than 50% respiratory variability, suggesting right atrial pressure of 3 mmHg. IAS/Shunts: No atrial level shunt detected by color flow Doppler.  LEFT VENTRICLE PLAX 2D LVIDd:         3.80 cm     Diastology LVIDs:         1.70 cm     LV e' medial:    4.03 cm/s LV PW:         1.20 cm     LV E/e' medial:  16.4 LV IVS:        0.90 cm     LV e' lateral:   3.81 cm/s LVOT diam:     2.00 cm     LV E/e' lateral: 17.3 LV SV:         58 LV SV Index:   39 LVOT Area:     3.14 cm  LV Volumes (MOD) LV vol d, MOD A4C: 57.3 ml LV vol s, MOD A4C: 17.5 ml LV SV MOD A4C:     57.3 ml RIGHT VENTRICLE RV Basal diam:  2.60 cm RV S prime:     17.30 cm/s TAPSE (M-mode): 1.9 cm LEFT ATRIUM           Index        RIGHT ATRIUM           Index LA diam:      2.90 cm 1.96 cm/m   RA Area:     12.80 cm LA Vol (A2C): 25.3 ml 17.07 ml/m  RA Volume:   29.10 ml  19.63 ml/m LA Vol (A4C): 50.8 ml 34.27 ml/m  AORTIC VALVE                 PULMONIC VALVE AV Area (Vmax): 2.51 cm      PV Vmax:          1.35 m/s AV Vmax:        145.00 cm/s  PV Peak grad:     7.3 mmHg AV Peak Grad:   8.4 mmHg     PR End Diast Vel: 5.66 msec LVOT Vmax:      116.00 cm/s  RVOT Peak grad:   8 mmHg LVOT Vmean:     82.400 cm/s LVOT VTI:  0.185 m  AORTA Ao Root diam: 2.90 cm Ao Asc diam:  2.80 cm MITRAL VALVE                TRICUSPID VALVE MV Area (PHT): 4.68 cm     TR Peak grad:   26.6 mmHg MV Decel Time: 162 msec     TR Vmax:        258.00 cm/s MV E velocity: 66.10 cm/s MV A velocity: 106.00 cm/s  SHUNTS MV E/A ratio:  0.62         Systemic VTI:  0.18 m                             Systemic Diam: 2.00 cm Constancia Delton MD Electronically signed by Constancia Delton MD Signature Date/Time: 06/17/2023/9:46:34 AM    Final    CT Cervical Spine Wo Contrast Result Date: 06/16/2023 CLINICAL DATA:  Facial trauma. EXAM: CT CERVICAL SPINE WITHOUT CONTRAST TECHNIQUE: Multidetector CT imaging of the cervical spine was performed without intravenous contrast. Multiplanar CT image reconstructions were also generated. RADIATION DOSE REDUCTION: This exam was performed according to the departmental dose-optimization program which includes automated exposure control, adjustment of the mA and/or kV according to patient size and/or use of iterative reconstruction technique. COMPARISON:  None Available. FINDINGS: Alignment: Normal. Skull base and vertebrae: No acute fracture. No primary bone lesion or focal pathologic process. Soft tissues and spinal canal: No prevertebral fluid or swelling. No visible canal hematoma. Disc levels: There is disc space narrowing and endplate osteophyte formation throughout the cervical spine most significant at C5-C6 and C6-C7. No significant central canal or neural foraminal stenosis at any level. Upper chest: Negative. Other: Bilateral thyroid nodules are present measuring up to 1 cm. IMPRESSION: Impression 1. No acute fracture or traumatic subluxation of the cervical spine. 2. Mild degenerative  changes. 3. Thyroid nodules measuring up to 1 cm. No follow-up imaging recommended. (Ref: J Am Coll Radiol. 2015 Feb;12(2): 143-50). Electronically Signed   By: Tyron Gallon M.D.   On: 06/16/2023 17:20   CT Head Wo Contrast Result Date: 06/16/2023 CLINICAL DATA:  Trauma. EXAM: CT HEAD WITHOUT CONTRAST TECHNIQUE: Contiguous axial images were obtained from the base of the skull through the vertex without intravenous contrast. RADIATION DOSE REDUCTION: This exam was performed according to the departmental dose-optimization program which includes automated exposure control, adjustment of the mA and/or kV according to patient size and/or use of iterative reconstruction technique. COMPARISON:  None Available. FINDINGS: Brain: No evidence of acute infarction, hemorrhage, hydrocephalus, extra-axial collection or mass lesion/mass effect. There is mild periventricular white matter hypodensity, likely chronic small vessel ischemic change. Vascular: No hyperdense vessel or unexpected calcification. Skull: Normal. Negative for fracture or focal lesion. Sinuses/Orbits: No acute finding. Other: None. IMPRESSION: No acute intracranial abnormality. Electronically Signed   By: Tyron Gallon M.D.   On: 06/16/2023 16:41   DG Chest Port 1 View Result Date: 06/16/2023 CLINICAL DATA:  Questionable sepsis. EXAM: PORTABLE CHEST 1 VIEW COMPARISON:  None available. FINDINGS: Shallow inspiration. No focal consolidation or pneumothorax. Trace pleural effusions may be present. The cardiac silhouette is within limits. No acute osseous pathology. IMPRESSION: No focal consolidation. Trace pleural effusions may be present. Electronically Signed   By: Angus Bark M.D.   On: 06/16/2023 16:29   DG HIP UNILAT WITH PELVIS 2-3 VIEWS RIGHT Result Date: 06/16/2023 CLINICAL DATA:  Fall. EXAM: DG HIP (WITH OR WITHOUT PELVIS) 2-3V RIGHT COMPARISON:  None Available. FINDINGS: Pelvis is intact with normal and symmetric sacroiliac joints. No acute  fracture or dislocation. No aggressive osseous lesion. Visualized sacral arcuate lines are unremarkable. Unremarkable symphysis pubis. There are mild degenerative changes of bilateral hip joints without significant joint space narrowing. Osteophytosis of the superior acetabulum. No radiopaque foreign bodies. IMPRESSION: No acute osseous abnormality of the pelvis or right hip joint. Electronically Signed   By: Beula Brunswick M.D.   On: 06/16/2023 16:29    Assessment/Plan Rhabdomyolysis Developed rhabdomyolysis following a fall and prolonged immobility.  - Encourage physical therapy five times a week in the care facility and three times a week at home. - Promote additional activity beyond physical therapy to prevent muscle breakdown.  Pain in shoulder Experiencing nocturnal shoulder pain radiating to the back and leg. Possible relation to recent rhabdomyolysis or previous myalgia. Discussed short-term prednisone for relief, but she is hesitant due to potential side effects, including increased bleeding risk, ulcers, and psychotropic effects. - Administer a short course of prednisone for five days to assess pain improvement. - Encourage increased mobility and exercise to build stamina. - Continue weekly massage therapy sessions.  Hypertension Blood pressure elevated, currently in the 150s. Spironolactone initiation pending, which may aid in blood pressure control and reduce leg swelling. Previously on hydrochlorothiazide and losartan , however, caused significant electrolyte abnormalities.  - Initiate spironolactone in the morning for blood pressure management and leg swelling. - Cmp ordered by cardiology in 1 week.   Urinary frequency, chronic Experiencing nocturia, urinating four to five times nightly. Pessary use does not alleviate symptoms. Medications may cause more secondary problems, defer initation of therapy.  Family/ staff Communication: nursing, daughter  Labs/tests ordered:  none    I spent greater than 35 minutes for the care of this patient in face to face time, chart review, clinical documentation, patient education.

## 2023-07-14 ENCOUNTER — Other Ambulatory Visit: Payer: Self-pay | Admitting: Student

## 2023-07-14 NOTE — Telephone Encounter (Signed)
 Copied from CRM [id number]. Topic: Clinical - Medication Refill >> Jul 14, 2023 12:05 PM Danelle Dunning F wrote: Most Recent Primary Care Visit:  Provider: Curt Dover I  Department: [facility]  Visit Type: PHYSICAL  Date: 01/06/2023  Medication: losartan  (COZAAR ) 25 MG tablet  Has the patient contacted their pharmacy? Yes  Is this the correct pharmacy for this prescription? Yes  This is the patient's preferred pharmacy:   [id number] [place] [facility] - [address] [place] [address] Phone: [phone] Fax: [phone]     Has the prescription been filled recently? No  Is the patient out of the medication? Yes  Has the patient been seen for an appointment in the last year OR does the patient have an upcoming appointment? Yes  Can we respond through MyChart? Yes, Patient's daughter Theodoro Fisherman would like a call or text once prescription has been sent to pharmacy  Agent: Please be advised that Rx refills may take up to 3 business days. We ask that you follow-up with your pharmacy.

## 2023-07-17 ENCOUNTER — Other Ambulatory Visit: Payer: Self-pay | Admitting: Student

## 2023-07-17 DIAGNOSIS — R4189 Other symptoms and signs involving cognitive functions and awareness: Secondary | ICD-10-CM | POA: Diagnosis not present

## 2023-07-17 DIAGNOSIS — M6282 Rhabdomyolysis: Secondary | ICD-10-CM | POA: Diagnosis not present

## 2023-07-17 DIAGNOSIS — Z741 Need for assistance with personal care: Secondary | ICD-10-CM | POA: Diagnosis not present

## 2023-07-17 DIAGNOSIS — R278 Other lack of coordination: Secondary | ICD-10-CM | POA: Diagnosis not present

## 2023-07-17 DIAGNOSIS — R2689 Other abnormalities of gait and mobility: Secondary | ICD-10-CM | POA: Diagnosis not present

## 2023-07-17 DIAGNOSIS — Z9181 History of falling: Secondary | ICD-10-CM | POA: Diagnosis not present

## 2023-07-17 DIAGNOSIS — I214 Non-ST elevation (NSTEMI) myocardial infarction: Secondary | ICD-10-CM | POA: Diagnosis not present

## 2023-07-17 DIAGNOSIS — M6281 Muscle weakness (generalized): Secondary | ICD-10-CM | POA: Diagnosis not present

## 2023-07-17 DIAGNOSIS — I10 Essential (primary) hypertension: Secondary | ICD-10-CM | POA: Diagnosis not present

## 2023-07-17 MED ORDER — LOSARTAN POTASSIUM 25 MG PO TABS: 25.0000 mg | ORAL_TABLET | Freq: Every day | ORAL | 3 refills | Status: DC

## 2023-07-17 NOTE — Telephone Encounter (Signed)
 Copied from CRM [id number]. Topic: Clinical - Medication Refill >> Jul 17, 2023 11:52 AM Blair Bumpers wrote: Most Recent Primary Care Visit:  Provider: Curt Dover I  Department: [facility]  Visit Type: PHYSICAL  Date: 01/06/2023  Medication: losartan  (COZAAR ) 25 MG tablet  Has the patient contacted their pharmacy? No (Agent: If no, request that the patient contact the pharmacy for the refill. If patient does not wish to contact the pharmacy document the reason why and proceed with request.) (Agent: If yes, when and what did the pharmacy advise?)  Is this the correct pharmacy for this prescription? Yes If no, delete pharmacy and type the correct one.  This is the patient's preferred pharmacy:  [id number] [place] [facility] - [address] [place] [address] Phone: [phone] Fax: [phone]   Has the prescription been filled recently? Yes  Is the patient out of the medication? Will be out of medication tomorrow, 07/18/23  Has the patient been seen for an appointment in the last year OR does the patient have an upcoming appointment? Yes  Can we respond through MyChart? Yes  Agent: Please be advised that Rx refills may take up to 3 business days. We ask that you follow-up with your pharmacy.

## 2023-07-17 NOTE — Telephone Encounter (Signed)
 Ann Odom, daughter, called requesting refill for medication.  Patient was in [facility] and DISCHARGED on 07/12/2023  Has an upcoming appointment for Rehab Follow up on 5/16 with Dr. Jann Melody at the [facility].   Pended Rx and sent to Dr. Jann Melody for approval.

## 2023-07-18 ENCOUNTER — Encounter: Admitting: Nurse Practitioner

## 2023-07-18 DIAGNOSIS — I214 Non-ST elevation (NSTEMI) myocardial infarction: Secondary | ICD-10-CM | POA: Diagnosis not present

## 2023-07-18 DIAGNOSIS — Z9181 History of falling: Secondary | ICD-10-CM | POA: Diagnosis not present

## 2023-07-18 DIAGNOSIS — M6282 Rhabdomyolysis: Secondary | ICD-10-CM | POA: Diagnosis not present

## 2023-07-18 DIAGNOSIS — I10 Essential (primary) hypertension: Secondary | ICD-10-CM | POA: Diagnosis not present

## 2023-07-18 DIAGNOSIS — R2689 Other abnormalities of gait and mobility: Secondary | ICD-10-CM | POA: Diagnosis not present

## 2023-07-18 DIAGNOSIS — Z741 Need for assistance with personal care: Secondary | ICD-10-CM | POA: Diagnosis not present

## 2023-07-18 DIAGNOSIS — R4189 Other symptoms and signs involving cognitive functions and awareness: Secondary | ICD-10-CM | POA: Diagnosis not present

## 2023-07-18 DIAGNOSIS — R278 Other lack of coordination: Secondary | ICD-10-CM | POA: Diagnosis not present

## 2023-07-18 DIAGNOSIS — M6281 Muscle weakness (generalized): Secondary | ICD-10-CM | POA: Diagnosis not present

## 2023-07-19 DIAGNOSIS — Z741 Need for assistance with personal care: Secondary | ICD-10-CM | POA: Diagnosis not present

## 2023-07-19 DIAGNOSIS — R4189 Other symptoms and signs involving cognitive functions and awareness: Secondary | ICD-10-CM | POA: Diagnosis not present

## 2023-07-19 DIAGNOSIS — M6282 Rhabdomyolysis: Secondary | ICD-10-CM | POA: Diagnosis not present

## 2023-07-19 DIAGNOSIS — R2689 Other abnormalities of gait and mobility: Secondary | ICD-10-CM | POA: Diagnosis not present

## 2023-07-19 DIAGNOSIS — I10 Essential (primary) hypertension: Secondary | ICD-10-CM | POA: Diagnosis not present

## 2023-07-19 DIAGNOSIS — R278 Other lack of coordination: Secondary | ICD-10-CM | POA: Diagnosis not present

## 2023-07-19 DIAGNOSIS — M6281 Muscle weakness (generalized): Secondary | ICD-10-CM | POA: Diagnosis not present

## 2023-07-19 DIAGNOSIS — I214 Non-ST elevation (NSTEMI) myocardial infarction: Secondary | ICD-10-CM | POA: Diagnosis not present

## 2023-07-19 DIAGNOSIS — Z9181 History of falling: Secondary | ICD-10-CM | POA: Diagnosis not present

## 2023-07-20 ENCOUNTER — Encounter: Payer: Self-pay | Admitting: Nurse Practitioner

## 2023-07-20 ENCOUNTER — Ambulatory Visit: Admitting: Nurse Practitioner

## 2023-07-20 VITALS — BP 136/82 | HR 67 | Temp 97.4°F | Ht <= 58 in | Wt 139.0 lb

## 2023-07-20 DIAGNOSIS — I10 Essential (primary) hypertension: Secondary | ICD-10-CM

## 2023-07-20 DIAGNOSIS — E46 Unspecified protein-calorie malnutrition: Secondary | ICD-10-CM

## 2023-07-20 DIAGNOSIS — R600 Localized edema: Secondary | ICD-10-CM | POA: Diagnosis not present

## 2023-07-20 MED ORDER — SPIRONOLACTONE 25 MG PO TABS: 25.0000 mg | ORAL_TABLET | Freq: Every day | ORAL | 1 refills | Status: DC

## 2023-07-20 NOTE — Patient Instructions (Addendum)
-  encouraged to elevate legs above level of heart as tolerate Need to sleep with legs elevated- best to get in the bed  Put pillow under legs.   low sodium diet  Compression hose as tolerates (on in am, off in pm)- daily use is best  Start aldactone    Increase protein in diet Make sure you are getting 3 meals a day with good protein intake and a nutritional supplement daily after smallest meal

## 2023-07-20 NOTE — Progress Notes (Signed)
 Careteam: Patient Care Team: Valrie Gehrig, MD as PCP - General (Family Medicine) PLACE OF SERVICE:  [facility] ([place])   Advanced Directive information    Allergies  Allergen Reactions   Alendronate Sodium     REACTION: GI    Chief Complaint  Patient presents with   Leg Swelling    Leg Swelling with weeping for about 4 weeks.      Discussed the use of AI scribe software for clinical note transcription with the patient, who gave verbal consent to proceed.  History of Present Illness Ann ANDRIS "Mitchie" is a 89 year old female who presents with leg swelling.  She is experiencing significant leg swelling, which was present during her recent stay at a rehabilitation facility. She was previously on hydrochlorothiazide, which helped reduce the swelling but caused electrolyte imbalances. Consequently, she was started on spironolactone  to manage the swelling and prevent electrolyte disturbances. However, her daughter has not yet picked up the medication from the pharmacy.  Her legs are wrapped daily by nursing, which initially helped reduce the swelling. However, the swelling persists, particularly due to increased activity levels. She also receives leg massages and uses compression pumps, which she finds beneficial.  She sleeps in a chair due to difficulty getting in and out of bed, which affects her ability to elevate her legs properly. This has contributed to persistent swelling and weeping of fluid from her legs. She experiences mild shortness of breath, which she describes as baseline. No fast or abnormal heartbeats.  Her diet is low in sodium, and she is encouraged to maintain adequate protein intake.   Review of Systems:  Review of Systems  Constitutional:  Negative for chills, fever and weight loss.  HENT:  Negative for tinnitus.   Respiratory:  Positive for shortness of breath (baseline). Negative for cough and sputum production.   Cardiovascular:  Positive for  leg swelling. Negative for chest pain and palpitations.  Gastrointestinal:  Negative for abdominal pain, constipation, diarrhea and heartburn.  Genitourinary:  Negative for dysuria, frequency and urgency.  Musculoskeletal:  Negative for back pain, falls, joint pain and myalgias.  Skin: Negative.   Neurological:  Negative for dizziness and headaches.  Psychiatric/Behavioral:  Negative for depression and memory loss. The patient does not have insomnia.     Past Medical History:  Diagnosis Date   Diverticulosis    GERD (gastroesophageal reflux disease)    Hyperlipidemia    Hypertension    Osteoporosis    Polymyalgia rheumatica (HCC)    Wears dentures    partial upper and lower   Wears hearing aid in both ears    Past Surgical History:  Procedure Laterality Date   ANKLE FRACTURE SURGERY  07/12/2005   CATARACT EXTRACTION  2012   right   CATARACT EXTRACTION W/PHACO Left 09/07/2020   Procedure: CATARACT EXTRACTION PHACO AND INTRAOCULAR LENS PLACEMENT (IOC) LEFT 8.30 01:09.7;  Surgeon: Annell Kidney, MD;  Location: [facility];  Service: Ophthalmology;  Laterality: Left;  requests early as possible   TONSILLECTOMY     Social History:   reports that she has never smoked. She has never been exposed to tobacco smoke. She has never used smokeless tobacco. She reports current alcohol use. She reports that she does not use drugs.  Family History  Problem Relation Age of Onset   Arthritis Mother    Heart disease Father 80   Diabetes Brother    Cancer Sister        ovarian  Stroke Sister    Heart disease Daughter    Rheum arthritis Daughter     Medications: Patient's Medications  New Prescriptions   No medications on file  Previous Medications   ACETAMINOPHEN  (TYLENOL ) 500 MG TABLET    Take 1,000 mg by mouth every 8 (eight) hours as needed.   ESOMEPRAZOLE (NEXIUM) 20 MG CAPSULE    Take 20 mg by mouth daily at 12 noon.   GLUCOSE BLOOD (TRUE METRIX BLOOD GLUCOSE TEST)  TEST STRIP    Use to check blood sugar once daily   LOSARTAN  (COZAAR ) 25 MG TABLET    Take 1 tablet (25 mg total) by mouth daily.   MELATONIN 5 MG TABS    Take 5 mg by mouth at bedtime as needed.   MULTIPLE VITAMIN (MULTIVITAMIN) CAPSULE    Take 1 capsule by mouth daily.   NAPROXEN SODIUM 220 MG CAPS    Take 1 capsule by mouth 2 (two) times daily as needed.   POLYETHYLENE GLYCOL (MIRALAX  / GLYCOLAX ) 17 G PACKET    Take 17 g by mouth daily as needed for mild constipation.   PREDNISOLONE ACETATE (PRED FORTE) 1 % OPHTHALMIC SUSPENSION    Place 1 drop into both eyes at bedtime.   SPIRONOLACTONE  (ALDACTONE ) 25 MG TABLET    Take 1 tablet (25 mg total) by mouth daily.   TRUEPLUS LANCETS 33G MISC    Use to obtain blood sugar sample once daily   UNABLE TO FIND    Med Name: CBD Oil Give 0.75ml by mouth at bedtime  Modified Medications   No medications on file  Discontinued Medications   No medications on file    Physical Exam:  Vitals:   07/20/23 1018  BP: 136/82  Pulse: 67  Temp: (!) 97.4 F (36.3 C)  SpO2: 98%  Weight: 139 lb (63 kg)  Height: 4\' 9"  (1.448 m)   Body mass index is 30.08 kg/m. Wt Readings from Last 3 Encounters:  07/20/23 139 lb (63 kg)  07/12/23 137 lb 3.2 oz (62.2 kg)  07/11/23 135 lb (61.2 kg)    Physical Exam Constitutional:      General: She is not in acute distress.    Appearance: She is well-developed. She is not diaphoretic.  HENT:     Head: Normocephalic and atraumatic.     Mouth/Throat:     Pharynx: No oropharyngeal exudate.  Eyes:     Conjunctiva/sclera: Conjunctivae normal.     Pupils: Pupils are equal, round, and reactive to light.  Cardiovascular:     Rate and Rhythm: Normal rate and regular rhythm.     Heart sounds: Normal heart sounds.  Pulmonary:     Effort: Pulmonary effort is normal.     Breath sounds: Normal breath sounds.  Abdominal:     General: Bowel sounds are normal.     Palpations: Abdomen is soft.  Musculoskeletal:     Cervical  back: Normal range of motion and neck supple.     Right lower leg: Edema (2+) present.     Left lower leg: Edema (2+) present.  Skin:    General: Skin is warm and dry.  Neurological:     Mental Status: She is alert and oriented to person, place, and time.     Motor: Weakness present.     Gait: Gait abnormal.  Psychiatric:        Mood and Affect: Mood normal.     Labs reviewed: Basic Metabolic Panel: Recent Labs  06/16/23 1447 06/17/23 0242 06/18/23 0542 06/22/23 0000  NA 136 136 134* 138  K 2.8* 3.9 4.4 3.7  CL 100 104 106 106  CO2 22 19* 25 23*  GLUCOSE 112* 92 107*  --   BUN 23 27* 23 11  CREATININE 0.89 0.92 0.77 0.6  CALCIUM 9.7 9.5 8.9 9.1  MG 2.0  --   --   --    Liver Function Tests: Recent Labs    06/16/23 1447 06/17/23 0242 06/18/23 0542  AST 306* 235* 135*  ALT 86* 74* 61*  ALKPHOS 40 39 35*  BILITOT 2.0* 1.4* 0.9  PROT 6.4* 5.8* 4.9*  ALBUMIN 3.8 3.3* 2.8*   No results for input(s): "LIPASE", "AMYLASE" in the last 8760 hours. No results for input(s): "AMMONIA" in the last 8760 hours. CBC: Recent Labs    06/16/23 1447 06/17/23 0242 06/18/23 0542 06/18/23 1409 06/22/23 0000  WBC 18.2* 17.1* 9.8 10.9* 5.3  NEUTROABS 16.3*  --   --   --  3,360.00  HGB 14.5 13.1 10.8* 11.7* 11.4*  HCT 40.0 36.4 30.5* 32.5* 34*  MCV 87.9 89.7 89.7 89.3  --   PLT 192 174 139* 140* 231   Lipid Panel: Recent Labs    06/17/23 0242  CHOL 178  HDL 82  LDLCALC 81  TRIG 73  CHOLHDL 2.2   TSH: No results for input(s): "TSH" in the last 8760 hours. A1C: Lab Results  Component Value Date   HGBA1C 5.2 06/16/2023     Assessment/Plan Assessment and Plan Assessment & Plan Edema with skin weeping Chronic leg swelling with skin weeping, currently weeping is minimal.  Start  spironolactone - Rx sent to pharmacy  Compression therapy and leg elevation are needed to decrease LE edema . Skin is fragile, increasing risk of sores. Adequate protein intake supports  skin integrity. - Use compression hose daily, apply in the morning and remove before bed. - Elevate legs above heart level, especially at night. - Avoid high sodium foods. - Continue leg massages and compression pump therapy as tolerated  Protein calorie malnutrition  - Ensure adequate protein intake with 30 grams of protein per meal and a high-protein nutritional supplement daily.  Hypertension Blood pressure well-controlled with losartan . Hydrochlorothiazide discontinued due to electrolyte imbalances. - Continue losartan  25 mg daily - Monitor blood pressure regularly.  Follow up in clinic in 1 week   Micholas Drumwright K. Denney Fisherman  [facility] [phone]

## 2023-07-26 DIAGNOSIS — M6282 Rhabdomyolysis: Secondary | ICD-10-CM | POA: Diagnosis not present

## 2023-07-26 DIAGNOSIS — I10 Essential (primary) hypertension: Secondary | ICD-10-CM | POA: Diagnosis not present

## 2023-07-26 DIAGNOSIS — Z9181 History of falling: Secondary | ICD-10-CM | POA: Diagnosis not present

## 2023-07-26 DIAGNOSIS — Z741 Need for assistance with personal care: Secondary | ICD-10-CM | POA: Diagnosis not present

## 2023-07-26 DIAGNOSIS — R278 Other lack of coordination: Secondary | ICD-10-CM | POA: Diagnosis not present

## 2023-07-26 DIAGNOSIS — M6281 Muscle weakness (generalized): Secondary | ICD-10-CM | POA: Diagnosis not present

## 2023-07-26 DIAGNOSIS — R4189 Other symptoms and signs involving cognitive functions and awareness: Secondary | ICD-10-CM | POA: Diagnosis not present

## 2023-07-26 DIAGNOSIS — R2689 Other abnormalities of gait and mobility: Secondary | ICD-10-CM | POA: Diagnosis not present

## 2023-07-26 DIAGNOSIS — I214 Non-ST elevation (NSTEMI) myocardial infarction: Secondary | ICD-10-CM | POA: Diagnosis not present

## 2023-07-27 DIAGNOSIS — R4189 Other symptoms and signs involving cognitive functions and awareness: Secondary | ICD-10-CM | POA: Diagnosis not present

## 2023-07-27 DIAGNOSIS — I10 Essential (primary) hypertension: Secondary | ICD-10-CM | POA: Diagnosis not present

## 2023-07-27 DIAGNOSIS — M6282 Rhabdomyolysis: Secondary | ICD-10-CM | POA: Diagnosis not present

## 2023-07-27 DIAGNOSIS — R278 Other lack of coordination: Secondary | ICD-10-CM | POA: Diagnosis not present

## 2023-07-27 DIAGNOSIS — R2689 Other abnormalities of gait and mobility: Secondary | ICD-10-CM | POA: Diagnosis not present

## 2023-07-27 DIAGNOSIS — M6281 Muscle weakness (generalized): Secondary | ICD-10-CM | POA: Diagnosis not present

## 2023-07-27 DIAGNOSIS — Z741 Need for assistance with personal care: Secondary | ICD-10-CM | POA: Diagnosis not present

## 2023-07-27 DIAGNOSIS — I214 Non-ST elevation (NSTEMI) myocardial infarction: Secondary | ICD-10-CM | POA: Diagnosis not present

## 2023-07-27 DIAGNOSIS — Z9181 History of falling: Secondary | ICD-10-CM | POA: Diagnosis not present

## 2023-07-28 ENCOUNTER — Ambulatory Visit: Admitting: Student

## 2023-07-28 ENCOUNTER — Encounter: Payer: Self-pay | Admitting: Student

## 2023-07-28 VITALS — BP 136/72 | HR 74 | Temp 98.0°F | Ht <= 58 in | Wt 129.0 lb

## 2023-07-28 DIAGNOSIS — L821 Other seborrheic keratosis: Secondary | ICD-10-CM | POA: Diagnosis not present

## 2023-07-28 DIAGNOSIS — E46 Unspecified protein-calorie malnutrition: Secondary | ICD-10-CM | POA: Diagnosis not present

## 2023-07-28 DIAGNOSIS — M6281 Muscle weakness (generalized): Secondary | ICD-10-CM | POA: Diagnosis not present

## 2023-07-28 DIAGNOSIS — R600 Localized edema: Secondary | ICD-10-CM

## 2023-07-28 DIAGNOSIS — Z9181 History of falling: Secondary | ICD-10-CM | POA: Diagnosis not present

## 2023-07-28 DIAGNOSIS — M6282 Rhabdomyolysis: Secondary | ICD-10-CM | POA: Diagnosis not present

## 2023-07-28 DIAGNOSIS — I214 Non-ST elevation (NSTEMI) myocardial infarction: Secondary | ICD-10-CM | POA: Diagnosis not present

## 2023-07-28 DIAGNOSIS — R4189 Other symptoms and signs involving cognitive functions and awareness: Secondary | ICD-10-CM | POA: Diagnosis not present

## 2023-07-28 DIAGNOSIS — R278 Other lack of coordination: Secondary | ICD-10-CM | POA: Diagnosis not present

## 2023-07-28 DIAGNOSIS — I10 Essential (primary) hypertension: Secondary | ICD-10-CM

## 2023-07-28 DIAGNOSIS — R2689 Other abnormalities of gait and mobility: Secondary | ICD-10-CM | POA: Diagnosis not present

## 2023-07-28 DIAGNOSIS — Z741 Need for assistance with personal care: Secondary | ICD-10-CM | POA: Diagnosis not present

## 2023-07-28 MED ORDER — LOSARTAN POTASSIUM 50 MG PO TABS
50.0000 mg | ORAL_TABLET | Freq: Every day | ORAL | 6 refills | Status: DC
Start: 2023-07-28 — End: 2023-08-18

## 2023-07-28 NOTE — Patient Instructions (Signed)
 VISIT SUMMARY:  Today, you came in for a follow-up visit to discuss your leg swelling and blood pressure management. Your leg swelling has improved with the use of new compression socks, and you are managing the symptoms well with leg elevation and massages. Your blood pressure has been elevated, and we discussed adjusting your medication. You also mentioned occasional leg pain and skin lesions that you have been scratching.  YOUR PLAN:  -LEG EDEMA WITH WEEPING: Leg edema means swelling caused by fluid buildup in your legs. Your swelling has improved with the use of compression socks, and you should continue using them, elevate your legs, and massage them regularly. Keep an eye out for any redness, increased swelling, or leakage.  -LEG PAIN: Your leg pain is likely due to muscle discomfort. Stretching exercises and massages can help relieve the pain. Avoid using muscle relaxants as they can increase the risk of falls and affect your thinking.  -HYPERTENSION: Hypertension means high blood pressure. Your blood pressure has been elevated, and we will increase your losartan  dose to 50 mg if it goes above . We will also do some blood tests (CMP and CBC) on Monday to monitor your health.  -SKIN LESIONS (SEBORRHEIC KERATOSIS): Seborrheic keratosis are non-cancerous skin growths. You should use adhesive bandages to prevent scratching them. If they become irritated, consider seeing a dermatologist for further treatment.  Ask to use nustep in physical therapy   INSTRUCTIONS:  Please follow up with the blood tests (CMP and CBC) on Monday. If your blood pressure exceeds 150 mmHg, increase your losartan  dose to 50 mg. Continue using your compression socks, elevate your legs, and perform stretching exercises regularly. If you notice any redness, increased swelling, or leakage in your legs, please contact us  immediately.

## 2023-07-29 ENCOUNTER — Encounter: Payer: Self-pay | Admitting: Student

## 2023-07-29 NOTE — Progress Notes (Signed)
 Location:  [facility] POS: [facility] Provider: Jann Melody  Code Status: DNR Goals of Care:     06/21/2023    9:03 AM  Advanced Directives  Does Patient Have a Medical Advance Directive? Yes  Type of Advance Directive Out of facility DNR (pink MOST or yellow form)  Does patient want to make changes to medical advance directive? No - Patient declined     Chief Complaint  Patient presents with   Medical Management of Chronic Issues    Medical Management of Chronic Issues. Rehab Follow up. To Discuss need for Zoster, Tdap and Covid.    HPI: Patient is a 89 y.o. female seen today for medical management of chronic diseases.   Discussed the use of AI scribe software for clinical note transcription with the patient, who gave verbal consent to proceed.  History of Present Illness   Ann VRBA "Mitchie" is a 89 year old female who presents for follow-up regarding leg swelling and blood pressure management.  Her leg swelling, previously associated with weeping, is improving with the use of a new type of compression sock. She is able to apply the compression socks herself and regularly elevates her legs. Massages have helped reduce the swelling, and her weight has decreased, which she attributes to fluid loss. No redness or leakage from the legs is noted, but she experiences occasional leg pain, which is relieved by rubbing the area. She does not engage in regular stretching exercises.  Her blood pressure has been elevated. She is currently on spironolactone  25 mg and a lower dose of losartan . She has not taken her medications this morning, which is unusual for her. Previously, a higher dose of losartan  was more effective in controlling her blood pressure.  She has a history of a hard place behind her right knee, which she believes is now resolved. There was concern about a Baker's cyst, but she does not feel any current issues there.  Her appetite is good, and she maintains a healthy  diet, including oatmeal and cereal for breakfast. She is considering a trip to the mountains, which involves a four-hour car ride. She is concerned about the duration of sitting and plans to stop for walks to mitigate the risk of blood clots.  She has been scratching moles on her skin, which sometimes requires a Band-Aid to prevent further irritation. She has seen a dermatologist who reassured her that these are not cancerous but are a result of sun exposure over the years.         Past Medical History:  Diagnosis Date   Diverticulosis    GERD (gastroesophageal reflux disease)    Hyperlipidemia    Hypertension    Osteoporosis    Polymyalgia rheumatica (HCC)    Wears dentures    partial upper and lower   Wears hearing aid in both ears     Past Surgical History:  Procedure Laterality Date   ANKLE FRACTURE SURGERY  07/12/2005   CATARACT EXTRACTION  2012   right   CATARACT EXTRACTION W/PHACO Left 09/07/2020   Procedure: CATARACT EXTRACTION PHACO AND INTRAOCULAR LENS PLACEMENT (IOC) LEFT 8.30 01:09.7;  Surgeon: Annell Kidney, MD;  Location: [facility];  Service: Ophthalmology;  Laterality: Left;  requests early as possible   TONSILLECTOMY      Allergies  Allergen Reactions   Alendronate Sodium     REACTION: GI    Outpatient Encounter Medications as of 07/28/2023  Medication Sig   acetaminophen  (TYLENOL )  500 MG tablet Take 1,000 mg by mouth every 8 (eight) hours as needed.   esomeprazole (NEXIUM) 20 MG capsule Take 20 mg by mouth daily at 12 noon.   glucose blood (TRUE METRIX BLOOD GLUCOSE TEST) test strip Use to check blood sugar once daily   melatonin 5 MG TABS Take 5 mg by mouth at bedtime as needed.   Multiple Vitamin (MULTIVITAMIN) capsule Take 1 capsule by mouth daily.   Naproxen Sodium 220 MG CAPS Take 1 capsule by mouth 2 (two) times daily as needed.   polyethylene glycol (MIRALAX  / GLYCOLAX ) 17 g packet Take 17 g by mouth daily as needed for mild  constipation.   prednisoLONE acetate (PRED FORTE) 1 % ophthalmic suspension Place 1 drop into both eyes at bedtime.   spironolactone  (ALDACTONE ) 25 MG tablet Take 1 tablet (25 mg total) by mouth daily.   TRUEplus Lancets 33G MISC Use to obtain blood sugar sample once daily   UNABLE TO FIND Med Name: CBD Oil Give 0.75ml by mouth at bedtime   [DISCONTINUED] losartan  (COZAAR ) 25 MG tablet Take 1 tablet (25 mg total) by mouth daily.   losartan  (COZAAR ) 50 MG tablet Take 1 tablet (50 mg total) by mouth daily.   No facility-administered encounter medications on file as of 07/28/2023.    Review of Systems:  Review of Systems  Health Maintenance  Topic Date Due   Zoster Vaccines- Shingrix (1 of 2) 01/20/1943   DTaP/Tdap/Td (2 - Tdap) 07/08/2012   COVID-19 Vaccine (6 - Mixed Product risk 2024-25 season) 06/08/2023   INFLUENZA VACCINE  10/13/2023   Medicare Annual Wellness (AWV)  01/06/2024   Pneumonia Vaccine 8+ Years old  Completed   DEXA SCAN  Completed   HPV VACCINES  Aged Out   Meningococcal B Vaccine  Aged Out    Physical Exam: Vitals:   07/28/23 0929  BP: 136/72  Pulse: 74  Temp: 98 F (36.7 C)  SpO2: 97%  Weight: 129 lb (58.5 kg)  Height: 4\' 9"  (1.448 m)   Body mass index is 27.92 kg/m. Physical Exam Physical Exam   CHEST: Lungs clear to auscultation bilaterally. CARDIOVASCULAR: Heart sounds normal. EXTREMITIES: No edema or redness in legs.      Labs reviewed: Basic Metabolic Panel: Recent Labs    06/16/23 1447 06/17/23 0242 06/18/23 0542 06/22/23 0000  NA 136 136 134* 138  K 2.8* 3.9 4.4 3.7  CL 100 104 106 106  CO2 22 19* 25 23*  GLUCOSE 112* 92 107*  --   BUN 23 27* 23 11  CREATININE 0.89 0.92 0.77 0.6  CALCIUM 9.7 9.5 8.9 9.1  MG 2.0  --   --   --    Liver Function Tests: Recent Labs    06/16/23 1447 06/17/23 0242 06/18/23 0542  AST 306* 235* 135*  ALT 86* 74* 61*  ALKPHOS 40 39 35*  BILITOT 2.0* 1.4* 0.9  PROT 6.4* 5.8* 4.9*  ALBUMIN 3.8  3.3* 2.8*   No results for input(s): "LIPASE", "AMYLASE" in the last 8760 hours. No results for input(s): "AMMONIA" in the last 8760 hours. CBC: Recent Labs    06/16/23 1447 06/17/23 0242 06/18/23 0542 06/18/23 1409 06/22/23 0000  WBC 18.2* 17.1* 9.8 10.9* 5.3  NEUTROABS 16.3*  --   --   --  3,360.00  HGB 14.5 13.1 10.8* 11.7* 11.4*  HCT 40.0 36.4 30.5* 32.5* 34*  MCV 87.9 89.7 89.7 89.3  --   PLT 192 174 139* 140* 231  Lipid Panel: Recent Labs    06/17/23 0242  CHOL 178  HDL 82  LDLCALC 81  TRIG 73  CHOLHDL 2.2   Lab Results  Component Value Date   HGBA1C 5.2 06/16/2023    Procedures since last visit: No results found. Results          Assessment/Plan     Leg edema with weeping Bilateral leg edema previously with weeping, now improved with compression socks. No current leakage or erythema. Reports leg pain and occasional swelling. Elevation and massage are utilized for symptom management. She can independently apply compression socks and is willing to wear them longer, which is beneficial. - Continue current compression socks. - Encourage leg elevation and massage. - Monitor for erythema, increased swelling, or leakage.  Leg pain Intermittent leg pain, sometimes relieved by massage. No signs of erythema, swelling, or tenderness suggestive of thrombosis. No bulge or Baker's cyst detected. Pain likely related to muscle discomfort. Stretching and massage recommended for symptom relief. - Encourage stretching exercises. - Continue massage therapy. - Avoid muscle relaxants due to fall risk and cognitive effects.  Hypertension Blood pressure readings elevated. Currently on spironolactone  25 mg and lower dose losartan . Previous higher losartan  dose was more effective. Avoid hydrochlorothiazide due to risk of hypokalemia and hyponatremia. - Increase losartan  to 50 mg if blood pressure exceeds 150 mmHg. - Order CMP and CBC for home collection on Monday.  Skin  lesions (seborrheic keratosis) Seborrheic keratosis present, occasionally irritated and scratched. Dermatologist confirmed non-malignant. - Use adhesive bandages to prevent scratching. - Consider dermatological intervention if irritation occurs.      Protein Calorie def Patient with 10 lb weight loss, however, significant improvement of leg swelling at this time.    Labs/tests ordered:  * No order type specified * Next appt:  08/30/2023

## 2023-07-31 DIAGNOSIS — R278 Other lack of coordination: Secondary | ICD-10-CM | POA: Diagnosis not present

## 2023-07-31 DIAGNOSIS — I10 Essential (primary) hypertension: Secondary | ICD-10-CM | POA: Diagnosis not present

## 2023-07-31 DIAGNOSIS — R4189 Other symptoms and signs involving cognitive functions and awareness: Secondary | ICD-10-CM | POA: Diagnosis not present

## 2023-07-31 DIAGNOSIS — M6282 Rhabdomyolysis: Secondary | ICD-10-CM | POA: Diagnosis not present

## 2023-07-31 DIAGNOSIS — M6281 Muscle weakness (generalized): Secondary | ICD-10-CM | POA: Diagnosis not present

## 2023-07-31 DIAGNOSIS — Z741 Need for assistance with personal care: Secondary | ICD-10-CM | POA: Diagnosis not present

## 2023-07-31 DIAGNOSIS — R2689 Other abnormalities of gait and mobility: Secondary | ICD-10-CM | POA: Diagnosis not present

## 2023-07-31 DIAGNOSIS — Z9181 History of falling: Secondary | ICD-10-CM | POA: Diagnosis not present

## 2023-07-31 DIAGNOSIS — I214 Non-ST elevation (NSTEMI) myocardial infarction: Secondary | ICD-10-CM | POA: Diagnosis not present

## 2023-08-01 DIAGNOSIS — Z741 Need for assistance with personal care: Secondary | ICD-10-CM | POA: Diagnosis not present

## 2023-08-01 DIAGNOSIS — I214 Non-ST elevation (NSTEMI) myocardial infarction: Secondary | ICD-10-CM | POA: Diagnosis not present

## 2023-08-01 DIAGNOSIS — I10 Essential (primary) hypertension: Secondary | ICD-10-CM | POA: Diagnosis not present

## 2023-08-01 DIAGNOSIS — Z9181 History of falling: Secondary | ICD-10-CM | POA: Diagnosis not present

## 2023-08-01 DIAGNOSIS — M6281 Muscle weakness (generalized): Secondary | ICD-10-CM | POA: Diagnosis not present

## 2023-08-01 DIAGNOSIS — R2689 Other abnormalities of gait and mobility: Secondary | ICD-10-CM | POA: Diagnosis not present

## 2023-08-01 DIAGNOSIS — R4189 Other symptoms and signs involving cognitive functions and awareness: Secondary | ICD-10-CM | POA: Diagnosis not present

## 2023-08-01 DIAGNOSIS — R278 Other lack of coordination: Secondary | ICD-10-CM | POA: Diagnosis not present

## 2023-08-01 DIAGNOSIS — M6282 Rhabdomyolysis: Secondary | ICD-10-CM | POA: Diagnosis not present

## 2023-08-02 DIAGNOSIS — R4189 Other symptoms and signs involving cognitive functions and awareness: Secondary | ICD-10-CM | POA: Diagnosis not present

## 2023-08-02 DIAGNOSIS — I10 Essential (primary) hypertension: Secondary | ICD-10-CM | POA: Diagnosis not present

## 2023-08-02 DIAGNOSIS — R278 Other lack of coordination: Secondary | ICD-10-CM | POA: Diagnosis not present

## 2023-08-02 DIAGNOSIS — R2689 Other abnormalities of gait and mobility: Secondary | ICD-10-CM | POA: Diagnosis not present

## 2023-08-02 DIAGNOSIS — Z9181 History of falling: Secondary | ICD-10-CM | POA: Diagnosis not present

## 2023-08-02 DIAGNOSIS — I214 Non-ST elevation (NSTEMI) myocardial infarction: Secondary | ICD-10-CM | POA: Diagnosis not present

## 2023-08-02 DIAGNOSIS — M6281 Muscle weakness (generalized): Secondary | ICD-10-CM | POA: Diagnosis not present

## 2023-08-02 DIAGNOSIS — M6282 Rhabdomyolysis: Secondary | ICD-10-CM | POA: Diagnosis not present

## 2023-08-02 DIAGNOSIS — Z741 Need for assistance with personal care: Secondary | ICD-10-CM | POA: Diagnosis not present

## 2023-08-03 DIAGNOSIS — R4189 Other symptoms and signs involving cognitive functions and awareness: Secondary | ICD-10-CM | POA: Diagnosis not present

## 2023-08-03 DIAGNOSIS — Z9181 History of falling: Secondary | ICD-10-CM | POA: Diagnosis not present

## 2023-08-03 DIAGNOSIS — M6281 Muscle weakness (generalized): Secondary | ICD-10-CM | POA: Diagnosis not present

## 2023-08-03 DIAGNOSIS — R278 Other lack of coordination: Secondary | ICD-10-CM | POA: Diagnosis not present

## 2023-08-03 DIAGNOSIS — R2689 Other abnormalities of gait and mobility: Secondary | ICD-10-CM | POA: Diagnosis not present

## 2023-08-03 DIAGNOSIS — Z741 Need for assistance with personal care: Secondary | ICD-10-CM | POA: Diagnosis not present

## 2023-08-03 DIAGNOSIS — M6282 Rhabdomyolysis: Secondary | ICD-10-CM | POA: Diagnosis not present

## 2023-08-03 DIAGNOSIS — R600 Localized edema: Secondary | ICD-10-CM | POA: Diagnosis not present

## 2023-08-03 DIAGNOSIS — I214 Non-ST elevation (NSTEMI) myocardial infarction: Secondary | ICD-10-CM | POA: Diagnosis not present

## 2023-08-03 DIAGNOSIS — I10 Essential (primary) hypertension: Secondary | ICD-10-CM | POA: Diagnosis not present

## 2023-08-03 LAB — COMPLETE METABOLIC PANEL WITHOUT GFR
AG Ratio: 2.1 (calc) (ref 1.0–2.5)
ALT: 7 U/L (ref 6–29)
AST: 15 U/L (ref 10–35)
Albumin: 4 g/dL (ref 3.6–5.1)
Alkaline phosphatase (APISO): 51 U/L (ref 37–153)
BUN: 17 mg/dL (ref 7–25)
CO2: 27 mmol/L (ref 20–32)
Calcium: 9.7 mg/dL (ref 8.6–10.4)
Chloride: 102 mmol/L (ref 98–110)
Creat: 0.7 mg/dL (ref 0.60–0.95)
Globulin: 1.9 g/dL (ref 1.9–3.7)
Glucose, Bld: 92 mg/dL (ref 65–99)
Potassium: 4 mmol/L (ref 3.5–5.3)
Sodium: 135 mmol/L (ref 135–146)
Total Bilirubin: 0.5 mg/dL (ref 0.2–1.2)
Total Protein: 5.9 g/dL — ABNORMAL LOW (ref 6.1–8.1)

## 2023-08-03 LAB — CBC WITH DIFFERENTIAL/PLATELET
Absolute Lymphocytes: 1195 {cells}/uL (ref 850–3900)
Absolute Monocytes: 557 {cells}/uL (ref 200–950)
Basophils Absolute: 29 {cells}/uL (ref 0–200)
Basophils Relative: 0.5 %
Eosinophils Absolute: 70 {cells}/uL (ref 15–500)
Eosinophils Relative: 1.2 %
HCT: 35.2 % (ref 35.0–45.0)
Hemoglobin: 11.6 g/dL — ABNORMAL LOW (ref 11.7–15.5)
MCH: 31.4 pg (ref 27.0–33.0)
MCHC: 33 g/dL (ref 32.0–36.0)
MCV: 95.4 fL (ref 80.0–100.0)
MPV: 11.1 fL (ref 7.5–12.5)
Monocytes Relative: 9.6 %
Neutro Abs: 3950 {cells}/uL (ref 1500–7800)
Neutrophils Relative %: 68.1 %
Platelets: 181 10*3/uL (ref 140–400)
RBC: 3.69 10*6/uL — ABNORMAL LOW (ref 3.80–5.10)
RDW: 12 % (ref 11.0–15.0)
Total Lymphocyte: 20.6 %
WBC: 5.8 10*3/uL (ref 3.8–10.8)

## 2023-08-04 ENCOUNTER — Ambulatory Visit: Payer: Self-pay | Admitting: Family

## 2023-08-07 DIAGNOSIS — R2689 Other abnormalities of gait and mobility: Secondary | ICD-10-CM | POA: Diagnosis not present

## 2023-08-07 DIAGNOSIS — Z9181 History of falling: Secondary | ICD-10-CM | POA: Diagnosis not present

## 2023-08-07 DIAGNOSIS — Z741 Need for assistance with personal care: Secondary | ICD-10-CM | POA: Diagnosis not present

## 2023-08-07 DIAGNOSIS — M6282 Rhabdomyolysis: Secondary | ICD-10-CM | POA: Diagnosis not present

## 2023-08-07 DIAGNOSIS — M6281 Muscle weakness (generalized): Secondary | ICD-10-CM | POA: Diagnosis not present

## 2023-08-07 DIAGNOSIS — R278 Other lack of coordination: Secondary | ICD-10-CM | POA: Diagnosis not present

## 2023-08-07 DIAGNOSIS — R4189 Other symptoms and signs involving cognitive functions and awareness: Secondary | ICD-10-CM | POA: Diagnosis not present

## 2023-08-07 DIAGNOSIS — I214 Non-ST elevation (NSTEMI) myocardial infarction: Secondary | ICD-10-CM | POA: Diagnosis not present

## 2023-08-07 DIAGNOSIS — I10 Essential (primary) hypertension: Secondary | ICD-10-CM | POA: Diagnosis not present

## 2023-08-09 DIAGNOSIS — Z9181 History of falling: Secondary | ICD-10-CM | POA: Diagnosis not present

## 2023-08-09 DIAGNOSIS — R2689 Other abnormalities of gait and mobility: Secondary | ICD-10-CM | POA: Diagnosis not present

## 2023-08-09 DIAGNOSIS — Z741 Need for assistance with personal care: Secondary | ICD-10-CM | POA: Diagnosis not present

## 2023-08-09 DIAGNOSIS — I10 Essential (primary) hypertension: Secondary | ICD-10-CM | POA: Diagnosis not present

## 2023-08-09 DIAGNOSIS — R278 Other lack of coordination: Secondary | ICD-10-CM | POA: Diagnosis not present

## 2023-08-09 DIAGNOSIS — M6281 Muscle weakness (generalized): Secondary | ICD-10-CM | POA: Diagnosis not present

## 2023-08-09 DIAGNOSIS — I214 Non-ST elevation (NSTEMI) myocardial infarction: Secondary | ICD-10-CM | POA: Diagnosis not present

## 2023-08-09 DIAGNOSIS — R4189 Other symptoms and signs involving cognitive functions and awareness: Secondary | ICD-10-CM | POA: Diagnosis not present

## 2023-08-09 DIAGNOSIS — M6282 Rhabdomyolysis: Secondary | ICD-10-CM | POA: Diagnosis not present

## 2023-08-10 DIAGNOSIS — R2689 Other abnormalities of gait and mobility: Secondary | ICD-10-CM | POA: Diagnosis not present

## 2023-08-10 DIAGNOSIS — I10 Essential (primary) hypertension: Secondary | ICD-10-CM | POA: Diagnosis not present

## 2023-08-10 DIAGNOSIS — R4189 Other symptoms and signs involving cognitive functions and awareness: Secondary | ICD-10-CM | POA: Diagnosis not present

## 2023-08-10 DIAGNOSIS — M6282 Rhabdomyolysis: Secondary | ICD-10-CM | POA: Diagnosis not present

## 2023-08-10 DIAGNOSIS — Z9181 History of falling: Secondary | ICD-10-CM | POA: Diagnosis not present

## 2023-08-10 DIAGNOSIS — M6281 Muscle weakness (generalized): Secondary | ICD-10-CM | POA: Diagnosis not present

## 2023-08-10 DIAGNOSIS — R278 Other lack of coordination: Secondary | ICD-10-CM | POA: Diagnosis not present

## 2023-08-10 DIAGNOSIS — Z741 Need for assistance with personal care: Secondary | ICD-10-CM | POA: Diagnosis not present

## 2023-08-10 DIAGNOSIS — I214 Non-ST elevation (NSTEMI) myocardial infarction: Secondary | ICD-10-CM | POA: Diagnosis not present

## 2023-08-11 ENCOUNTER — Encounter: Payer: Self-pay | Admitting: Student

## 2023-08-11 ENCOUNTER — Ambulatory Visit: Admitting: Student

## 2023-08-11 VITALS — BP 138/66 | HR 72 | Temp 98.0°F | Ht <= 58 in | Wt 126.0 lb

## 2023-08-11 DIAGNOSIS — R2689 Other abnormalities of gait and mobility: Secondary | ICD-10-CM | POA: Diagnosis not present

## 2023-08-11 DIAGNOSIS — Z741 Need for assistance with personal care: Secondary | ICD-10-CM | POA: Diagnosis not present

## 2023-08-11 DIAGNOSIS — Z9181 History of falling: Secondary | ICD-10-CM | POA: Diagnosis not present

## 2023-08-11 DIAGNOSIS — R600 Localized edema: Secondary | ICD-10-CM

## 2023-08-11 DIAGNOSIS — I214 Non-ST elevation (NSTEMI) myocardial infarction: Secondary | ICD-10-CM | POA: Diagnosis not present

## 2023-08-11 DIAGNOSIS — R350 Frequency of micturition: Secondary | ICD-10-CM

## 2023-08-11 DIAGNOSIS — R4189 Other symptoms and signs involving cognitive functions and awareness: Secondary | ICD-10-CM | POA: Diagnosis not present

## 2023-08-11 DIAGNOSIS — M6281 Muscle weakness (generalized): Secondary | ICD-10-CM | POA: Diagnosis not present

## 2023-08-11 DIAGNOSIS — M6282 Rhabdomyolysis: Secondary | ICD-10-CM | POA: Diagnosis not present

## 2023-08-11 DIAGNOSIS — R278 Other lack of coordination: Secondary | ICD-10-CM | POA: Diagnosis not present

## 2023-08-11 DIAGNOSIS — I10 Essential (primary) hypertension: Secondary | ICD-10-CM | POA: Diagnosis not present

## 2023-08-11 LAB — POCT URINALYSIS DIPSTICK
Bilirubin, UA: NEGATIVE
Glucose, UA: NEGATIVE
Ketones, UA: NEGATIVE
Protein, UA: POSITIVE — AB
Spec Grav, UA: 1.01 (ref 1.010–1.025)
Urobilinogen, UA: NEGATIVE U/dL — AB
pH, UA: 5 (ref 5.0–8.0)

## 2023-08-11 MED ORDER — FUROSEMIDE 20 MG PO TABS: 20.0000 mg | ORAL_TABLET | Freq: Every day | ORAL | 3 refills | Status: DC

## 2023-08-11 MED ORDER — SULFAMETHOXAZOLE-TRIMETHOPRIM 800-160 MG PO TABS: 1.0000 | ORAL_TABLET | Freq: Two times a day (BID) | ORAL | 0 refills | Status: DC

## 2023-08-11 NOTE — Patient Instructions (Signed)
 VISIT SUMMARY:  Today, you were seen for increased daytime sleepiness and frequent nighttime awakenings. We discussed your recent symptoms, including increased leg swelling and concerns about a possible urinary tract infection. We reviewed your current medications and recent blood test results.  YOUR PLAN:  -URINARY TRACT INFECTION: A urinary tract infection (UTI) is an infection in any part of your urinary system. Your symptoms and urine test suggest a UTI. We have sent your urine for culture and prescribed Bactrim  800/160 mg to be taken twice daily for 7 days. Please drink more water, at least one cup with each meal and one in between. Take Bactrim  with food to prevent diarrhea. We will review the culture results on Monday and adjust your antibiotics if necessary.  -LEG SWELLING: Your leg swelling has increased recently. We will temporarily add furosemide (Lasix) to help manage the swelling while continuing your current medication, spironolactone . A phlebotomist will collect blood samples on Monday to check your potassium levels.  -LOW HEMOGLOBIN: Hemoglobin is a protein in your red blood cells that carries oxygen. Your recent blood test showed low hemoglobin levels, but they are improving. Continue efforts to increase your protein intake to support your hemoglobin levels.  -LOW PROTEIN LEVELS: Your recent blood test showed low protein levels, but they are improving. Continue efforts to increase your protein intake.  INSTRUCTIONS:  Please follow up on Monday for a review of your urine culture results and blood tests. Continue taking your medications as prescribed and follow the recommendations for increased water and protein intake.

## 2023-08-11 NOTE — Progress Notes (Signed)
 Location:  [facility] Provider: Jann Melody  Code Status: DNR Goals of Care:     06/21/2023    9:03 AM  Advanced Directives  Does Patient Have a Medical Advance Directive? Yes  Type of Advance Directive Out of facility DNR (pink MOST or yellow form)  Does patient want to make changes to medical advance directive? No - Patient declined     Chief Complaint  Patient presents with   Urinary Tract Infection    Possible UTI. Urine Frequency and No Energy.     HPI: Patient is a 89 y.o. female seen today for medical management of chronic diseases.   Discussed the use of AI scribe software for clinical note transcription with the patient, who gave verbal consent to proceed.  History of Present Illness   Ann Odom "Mitchie" is a 89 year old female who presents with increased daytime sleepiness and nocturnal awakenings.  She has been experiencing increased daytime sleepiness and nocturnal awakenings over the past week, falling asleep during conversations and showing a lack of energy and interest in activities. Her daughter notes frequent nighttime awakenings around 3 AM to make coffee, more often than usual. She switched to decaf coffee and increased her melatonin dose from 5 mg to 8 mg, which seemed to improve her sleep last night.  There is a history of urinary tract infections, and her daughter suspects a possible UTI due to increased nighttime awakenings. No pain is reported, but hygiene is a concern. A urine dip test showed positive nitrates and a moderate amount of infection-fighting cells.  Her legs have been more swollen than usual, and she experiences soreness in her feet, likely from walking barefoot on carpet. She uses a lift chair with a wedge for leg elevation, but repositioning at night may be difficult. She takes spironolactone  in the morning for swelling, which helps retain potassium, and her daughter notes her legs are not as tender today.  She has been  taking melatonin for a long time to aid sleep, and her current medication regimen includes spironolactone . Recent blood tests showed low protein and hemoglobin levels, but these have improved compared to previous results. Her daughter is working on increasing her protein intake.  [place] and was accustomed to waking up early, which she continues to do. She lives at home and is usually up and dressed by 7:30 AM. No signs of transient ischemic attack such as weakness or slurred speech. Her daughter reports no fever or chills, and she has been drinking more water recently.         Past Medical History:  Diagnosis Date   Diverticulosis    GERD (gastroesophageal reflux disease)    Hyperlipidemia    Hypertension    Osteoporosis    Polymyalgia rheumatica (HCC)    Wears dentures    partial upper and lower   Wears hearing aid in both ears     Past Surgical History:  Procedure Laterality Date   ANKLE FRACTURE SURGERY  07/12/2005   CATARACT EXTRACTION  2012   right   CATARACT EXTRACTION W/PHACO Left 09/07/2020   Procedure: CATARACT EXTRACTION PHACO AND INTRAOCULAR LENS PLACEMENT (IOC) LEFT 8.30 01:09.7;  Surgeon: Annell Kidney, MD;  Location: [facility];  Service: Ophthalmology;  Laterality: Left;  requests early as possible   TONSILLECTOMY      Allergies  Allergen Reactions   Alendronate Sodium     REACTION: GI  Outpatient Encounter Medications as of 08/11/2023  Medication Sig   acetaminophen  (TYLENOL ) 500 MG tablet Take 1,000 mg by mouth every 8 (eight) hours as needed.   esomeprazole (NEXIUM) 20 MG capsule Take 20 mg by mouth daily at 12 noon.   furosemide  (LASIX ) 20 MG tablet Take 1 tablet (20 mg total) by mouth daily.   glucose blood (TRUE METRIX BLOOD GLUCOSE TEST) test strip Use to check blood sugar once daily   losartan  (COZAAR ) 50 MG tablet Take 1 tablet (50 mg total) by mouth daily.   melatonin 5 MG TABS Take 8 mg by mouth at bedtime as needed.    Multiple Vitamin (MULTIVITAMIN) capsule Take 1 capsule by mouth daily.   Naproxen Sodium 220 MG CAPS Take 1 capsule by mouth 2 (two) times daily as needed.   polyethylene glycol (MIRALAX  / GLYCOLAX ) 17 g packet Take 17 g by mouth daily as needed for mild constipation.   prednisoLONE acetate (PRED FORTE) 1 % ophthalmic suspension Place 1 drop into both eyes at bedtime.   spironolactone  (ALDACTONE ) 25 MG tablet Take 1 tablet (25 mg total) by mouth daily.   sulfamethoxazole -trimethoprim  (BACTRIM  DS) 800-160 MG tablet Take 1 tablet by mouth 2 (two) times daily for 7 days.   TRUEplus Lancets 33G MISC Use to obtain blood sugar sample once daily   UNABLE TO FIND Med Name: CBD Oil Give 0.75ml by mouth at bedtime   No facility-administered encounter medications on file as of 08/11/2023.    Review of Systems:  Review of Systems  Health Maintenance  Topic Date Due   Zoster Vaccines- Shingrix (1 of 2) 01/20/1943   DTaP/Tdap/Td (2 - Tdap) 07/08/2012   COVID-19 Vaccine (6 - Mixed Product risk 2024-25 season) 06/08/2023   INFLUENZA VACCINE  10/13/2023   Medicare Annual Wellness (AWV)  01/06/2024   Pneumonia Vaccine 89+ Years old  Completed   DEXA SCAN  Completed   HPV VACCINES  Aged Out   Meningococcal B Vaccine  Aged Out    Physical Exam: Vitals:   08/11/23 0829  BP: 138/66  Pulse: 72  Temp: 98 F (36.7 C)  SpO2: 94%  Weight: 126 lb (57.2 kg)  Height: 4\' 9"  (1.448 m)   Body mass index is 27.27 kg/m. Physical Exam Constitutional:      Appearance: Normal appearance.  Cardiovascular:     Rate and Rhythm: Normal rate and regular rhythm.     Pulses: Normal pulses.     Heart sounds: Normal heart sounds.  Pulmonary:     Effort: Pulmonary effort is normal.  Abdominal:     General: Abdomen is flat. Bowel sounds are normal.     Palpations: Abdomen is soft.  Musculoskeletal:        General: Swelling present. No tenderness.  Skin:    General: Skin is warm and dry.  Neurological:      Mental Status: She is alert and oriented to person, place, and time.     Gait: Gait normal.  Psychiatric:        Mood and Affect: Mood normal.    Labs reviewed: Basic Metabolic Panel: Recent Labs    06/16/23 1447 06/17/23 0242 06/18/23 0542 06/22/23 0000 08/03/23 0820  NA 136 136 134* 138 135  K 2.8* 3.9 4.4 3.7 4.0  CL 100 104 106 106 102  CO2 22 19* 25 23* 27  GLUCOSE 112* 92 107*  --  92  BUN 23 27* 23 11 17   CREATININE 0.89 0.92 0.77 0.6  0.70  CALCIUM 9.7 9.5 8.9 9.1 9.7  MG 2.0  --   --   --   --    Liver Function Tests: Recent Labs    06/16/23 1447 06/17/23 0242 06/18/23 0542 08/03/23 0820  AST 306* 235* 135* 15  ALT 86* 74* 61* 7  ALKPHOS 40 39 35*  --   BILITOT 2.0* 1.4* 0.9 0.5  PROT 6.4* 5.8* 4.9* 5.9*  ALBUMIN 3.8 3.3* 2.8*  --    No results for input(s): "LIPASE", "AMYLASE" in the last 8760 hours. No results for input(s): "AMMONIA" in the last 8760 hours. CBC: Recent Labs    06/16/23 1447 06/17/23 0242 06/18/23 0542 06/18/23 1409 06/22/23 0000 08/03/23 0820  WBC 18.2*   < > 9.8 10.9* 5.3 5.8  NEUTROABS 16.3*  --   --   --  3,360.00 3,950  HGB 14.5   < > 10.8* 11.7* 11.4* 11.6*  HCT 40.0   < > 30.5* 32.5* 34* 35.2  MCV 87.9   < > 89.7 89.3  --  95.4  PLT 192   < > 139* 140* 231 181   < > = values in this interval not displayed.   Lipid Panel: Recent Labs    06/17/23 0242  CHOL 178  HDL 82  LDLCALC 81  TRIG 73  CHOLHDL 2.2   Lab Results  Component Value Date   HGBA1C 5.2 06/16/2023    Procedures since last visit: No results found. Results   LABS Urinalysis: Positive nitrates, moderate leukocytes, hematuria (08/11/2023) Protein: Low Hemoglobin: Low      Assessment/Plan     Urinary tract infection Urinary tract infection suspected due to positive nitrates and moderate pyuria in urinalysis. Symptoms include nocturia and fatigue. Differential includes dehydration, but urinalysis findings suggest infection. Immediate treatment  initiated to prevent progression to fever and chills. - Send urine for culture - Prescribe Bactrim  800/160 mg, twice daily for 7 days - Advise increased water intake, at least one cup with each meal and one in between - Recommend taking Bactrim  with food to prevent diarrhea - Review culture results on Monday and adjust antibiotics if necessary  Leg swelling Increased leg swelling for over two weeks, more pronounced but not weeping. Spironolactone  is managing potassium levels. Consideration of additional diuretic therapy to manage swelling. Furosemide will be used temporarily to address the increased swelling while monitoring potassium levels. - Prescribe furosemide (Lasix) for a few days to manage swelling - Arrange for phlebotomist to collect labs on Monday to check potassium levels  Low hemoglobin Recent blood test showed low hemoglobin, but levels are improving. Efforts to increase protein intake are ongoing to support hemoglobin levels.  Low protein levels Recent blood test showed low protein levels, but levels are improving. Efforts to increase protein intake are ongoing.        Labs/tests ordered:  * No order type specified * Next appt:  08/18/2023   I spent greater than 30 minutes for the care of this patient in face to face time, chart review, clinical documentation, patient education.

## 2023-08-13 LAB — URINALYSIS, ROUTINE W REFLEX MICROSCOPIC
Bilirubin Urine: NEGATIVE
Glucose, UA: NEGATIVE
Hgb urine dipstick: NEGATIVE
Hyaline Cast: NONE SEEN /LPF
Ketones, ur: NEGATIVE
Nitrite: POSITIVE — AB
Protein, ur: NEGATIVE
Specific Gravity, Urine: 1.013 (ref 1.001–1.035)
Squamous Epithelial / HPF: NONE SEEN /HPF (ref <=5)
WBC, UA: >=60 /HPF — AB (ref 0–5)
pH: 5.5 (ref 5.0–8.0)

## 2023-08-13 LAB — URINE CULTURE
MICRO NUMBER:: 16519971
SPECIMEN QUALITY:: ADEQUATE

## 2023-08-14 ENCOUNTER — Ambulatory Visit: Payer: Self-pay | Admitting: Student

## 2023-08-14 ENCOUNTER — Telehealth: Payer: Self-pay

## 2023-08-14 DIAGNOSIS — E876 Hypokalemia: Secondary | ICD-10-CM

## 2023-08-14 DIAGNOSIS — I214 Non-ST elevation (NSTEMI) myocardial infarction: Secondary | ICD-10-CM | POA: Diagnosis not present

## 2023-08-14 DIAGNOSIS — R2689 Other abnormalities of gait and mobility: Secondary | ICD-10-CM | POA: Diagnosis not present

## 2023-08-14 DIAGNOSIS — R600 Localized edema: Secondary | ICD-10-CM | POA: Diagnosis not present

## 2023-08-14 DIAGNOSIS — K219 Gastro-esophageal reflux disease without esophagitis: Secondary | ICD-10-CM | POA: Diagnosis not present

## 2023-08-14 DIAGNOSIS — M6282 Rhabdomyolysis: Secondary | ICD-10-CM | POA: Diagnosis not present

## 2023-08-14 DIAGNOSIS — R4189 Other symptoms and signs involving cognitive functions and awareness: Secondary | ICD-10-CM | POA: Diagnosis not present

## 2023-08-14 DIAGNOSIS — R278 Other lack of coordination: Secondary | ICD-10-CM | POA: Diagnosis not present

## 2023-08-14 DIAGNOSIS — I10 Essential (primary) hypertension: Secondary | ICD-10-CM | POA: Diagnosis not present

## 2023-08-14 DIAGNOSIS — Z9181 History of falling: Secondary | ICD-10-CM | POA: Diagnosis not present

## 2023-08-14 DIAGNOSIS — M6281 Muscle weakness (generalized): Secondary | ICD-10-CM | POA: Diagnosis not present

## 2023-08-14 LAB — CBC WITH DIFFERENTIAL/PLATELET
Absolute Lymphocytes: 892 {cells}/uL (ref 850–3900)
Absolute Monocytes: 543 {cells}/uL (ref 200–950)
Basophils Absolute: 32 {cells}/uL (ref 0–200)
Basophils Relative: 0.7 %
Eosinophils Absolute: 60 {cells}/uL (ref 15–500)
Eosinophils Relative: 1.3 %
HCT: 33 % — ABNORMAL LOW (ref 35.0–45.0)
Hemoglobin: 11 g/dL — ABNORMAL LOW (ref 11.7–15.5)
MCH: 31.3 pg (ref 27.0–33.0)
MCHC: 33.3 g/dL (ref 32.0–36.0)
MCV: 94 fL (ref 80.0–100.0)
MPV: 10.9 fL (ref 7.5–12.5)
Monocytes Relative: 11.8 %
Neutro Abs: 3073 {cells}/uL (ref 1500–7800)
Neutrophils Relative %: 66.8 %
Platelets: 209 10*3/uL (ref 140–400)
RBC: 3.51 10*6/uL — ABNORMAL LOW (ref 3.80–5.10)
RDW: 12.1 % (ref 11.0–15.0)
Total Lymphocyte: 19.4 %
WBC: 4.6 10*3/uL (ref 3.8–10.8)

## 2023-08-14 LAB — BASIC METABOLIC PANEL WITHOUT GFR
BUN/Creatinine Ratio: 17 (calc) (ref 6–22)
BUN: 23 mg/dL (ref 7–25)
CO2: 24 mmol/L (ref 20–32)
Calcium: 9.8 mg/dL (ref 8.6–10.4)
Chloride: 95 mmol/L — ABNORMAL LOW (ref 98–110)
Creat: 1.32 mg/dL — ABNORMAL HIGH (ref 0.60–0.95)
Glucose, Bld: 109 mg/dL — ABNORMAL HIGH (ref 65–99)
Potassium: 4.6 mmol/L (ref 3.5–5.3)
Sodium: 128 mmol/L — ABNORMAL LOW (ref 135–146)

## 2023-08-14 NOTE — Telephone Encounter (Signed)
Message sent to PCP Earnestine Mealing, MD

## 2023-08-14 NOTE — Telephone Encounter (Signed)
 MyChart message sent to patient.

## 2023-08-14 NOTE — Telephone Encounter (Signed)
 Thank you for this message. The plan was to have lasix  for 3 days over the weekend. Vitamin B12 was checked in April and it was within normal range. Vitamin D can't be added because of the type of collection tube used for different labs. Happy to have that added to her next round of labs.

## 2023-08-14 NOTE — Telephone Encounter (Signed)
 Copied from CRM [id number]. Topic: Clinical - Request for Lab/Test Order >> Aug 14, 2023  2:35 PM Lenon Radar A wrote: Reason for CRM: Patients daughter called in requesting if she can have additional labs vitamin b 12 and vitamin d added to the lab work that she had done today. Patient has already had blood drawn. Please add and if not able to please contact patient to advise. Patient can be reached at [phone]. Patients daughter also questioned if there is a timeframe for her to stop taking the Lasix  medication. Please advise.

## 2023-08-15 DIAGNOSIS — I214 Non-ST elevation (NSTEMI) myocardial infarction: Secondary | ICD-10-CM | POA: Diagnosis not present

## 2023-08-15 DIAGNOSIS — I10 Essential (primary) hypertension: Secondary | ICD-10-CM | POA: Diagnosis not present

## 2023-08-15 DIAGNOSIS — Z9181 History of falling: Secondary | ICD-10-CM | POA: Diagnosis not present

## 2023-08-15 DIAGNOSIS — K219 Gastro-esophageal reflux disease without esophagitis: Secondary | ICD-10-CM | POA: Diagnosis not present

## 2023-08-15 DIAGNOSIS — R278 Other lack of coordination: Secondary | ICD-10-CM | POA: Diagnosis not present

## 2023-08-15 DIAGNOSIS — M6281 Muscle weakness (generalized): Secondary | ICD-10-CM | POA: Diagnosis not present

## 2023-08-15 DIAGNOSIS — R4189 Other symptoms and signs involving cognitive functions and awareness: Secondary | ICD-10-CM | POA: Diagnosis not present

## 2023-08-15 DIAGNOSIS — R2689 Other abnormalities of gait and mobility: Secondary | ICD-10-CM | POA: Diagnosis not present

## 2023-08-15 DIAGNOSIS — M6282 Rhabdomyolysis: Secondary | ICD-10-CM | POA: Diagnosis not present

## 2023-08-16 DIAGNOSIS — K219 Gastro-esophageal reflux disease without esophagitis: Secondary | ICD-10-CM | POA: Diagnosis not present

## 2023-08-16 DIAGNOSIS — I214 Non-ST elevation (NSTEMI) myocardial infarction: Secondary | ICD-10-CM | POA: Diagnosis not present

## 2023-08-16 DIAGNOSIS — R278 Other lack of coordination: Secondary | ICD-10-CM | POA: Diagnosis not present

## 2023-08-16 DIAGNOSIS — Z9181 History of falling: Secondary | ICD-10-CM | POA: Diagnosis not present

## 2023-08-16 DIAGNOSIS — R2689 Other abnormalities of gait and mobility: Secondary | ICD-10-CM | POA: Diagnosis not present

## 2023-08-16 DIAGNOSIS — I10 Essential (primary) hypertension: Secondary | ICD-10-CM | POA: Diagnosis not present

## 2023-08-16 DIAGNOSIS — M6282 Rhabdomyolysis: Secondary | ICD-10-CM | POA: Diagnosis not present

## 2023-08-16 DIAGNOSIS — M6281 Muscle weakness (generalized): Secondary | ICD-10-CM | POA: Diagnosis not present

## 2023-08-16 DIAGNOSIS — R4189 Other symptoms and signs involving cognitive functions and awareness: Secondary | ICD-10-CM | POA: Diagnosis not present

## 2023-08-17 DIAGNOSIS — M6281 Muscle weakness (generalized): Secondary | ICD-10-CM | POA: Diagnosis not present

## 2023-08-17 DIAGNOSIS — Z9181 History of falling: Secondary | ICD-10-CM | POA: Diagnosis not present

## 2023-08-17 DIAGNOSIS — I10 Essential (primary) hypertension: Secondary | ICD-10-CM | POA: Diagnosis not present

## 2023-08-17 DIAGNOSIS — R278 Other lack of coordination: Secondary | ICD-10-CM | POA: Diagnosis not present

## 2023-08-17 DIAGNOSIS — R4189 Other symptoms and signs involving cognitive functions and awareness: Secondary | ICD-10-CM | POA: Diagnosis not present

## 2023-08-17 DIAGNOSIS — E876 Hypokalemia: Secondary | ICD-10-CM | POA: Diagnosis not present

## 2023-08-17 DIAGNOSIS — M6282 Rhabdomyolysis: Secondary | ICD-10-CM | POA: Diagnosis not present

## 2023-08-17 DIAGNOSIS — I214 Non-ST elevation (NSTEMI) myocardial infarction: Secondary | ICD-10-CM | POA: Diagnosis not present

## 2023-08-17 DIAGNOSIS — K219 Gastro-esophageal reflux disease without esophagitis: Secondary | ICD-10-CM | POA: Diagnosis not present

## 2023-08-17 DIAGNOSIS — R2689 Other abnormalities of gait and mobility: Secondary | ICD-10-CM | POA: Diagnosis not present

## 2023-08-17 LAB — BASIC METABOLIC PANEL WITHOUT GFR
BUN/Creatinine Ratio: 15 (calc) (ref 6–22)
BUN: 19 mg/dL (ref 7–25)
CO2: 24 mmol/L (ref 20–32)
Calcium: 9.9 mg/dL (ref 8.6–10.4)
Chloride: 93 mmol/L — ABNORMAL LOW (ref 98–110)
Creat: 1.3 mg/dL — ABNORMAL HIGH (ref 0.60–0.95)
Glucose, Bld: 98 mg/dL (ref 65–99)
Potassium: 5.1 mmol/L (ref 3.5–5.3)
Sodium: 126 mmol/L — ABNORMAL LOW (ref 135–146)

## 2023-08-17 LAB — VITAMIN D 25 HYDROXY (VIT D DEFICIENCY, FRACTURES): Vit D, 25-Hydroxy: 54 ng/mL (ref 30–100)

## 2023-08-18 ENCOUNTER — Encounter: Payer: Self-pay | Admitting: Student

## 2023-08-18 ENCOUNTER — Ambulatory Visit: Admitting: Student

## 2023-08-18 VITALS — BP 132/68 | HR 63 | Temp 97.7°F | Ht <= 58 in | Wt 126.0 lb

## 2023-08-18 DIAGNOSIS — N1831 Chronic kidney disease, stage 3a: Secondary | ICD-10-CM

## 2023-08-18 DIAGNOSIS — I10 Essential (primary) hypertension: Secondary | ICD-10-CM

## 2023-08-18 DIAGNOSIS — N179 Acute kidney failure, unspecified: Secondary | ICD-10-CM

## 2023-08-18 DIAGNOSIS — R600 Localized edema: Secondary | ICD-10-CM

## 2023-08-18 DIAGNOSIS — R5383 Other fatigue: Secondary | ICD-10-CM

## 2023-08-18 DIAGNOSIS — E876 Hypokalemia: Secondary | ICD-10-CM

## 2023-08-18 DIAGNOSIS — E871 Hypo-osmolality and hyponatremia: Secondary | ICD-10-CM | POA: Diagnosis not present

## 2023-08-18 DIAGNOSIS — E875 Hyperkalemia: Secondary | ICD-10-CM

## 2023-08-18 MED ORDER — SODIUM CHLORIDE 1 G PO TABS: 1.0000 g | ORAL_TABLET | Freq: Two times a day (BID) | ORAL | 0 refills | Status: AC

## 2023-08-18 NOTE — Patient Instructions (Signed)
 VISIT SUMMARY:  Today, you were seen for leg swelling and hoarseness following a recent fall. We discussed your symptoms, recent lab results, and current medications. Your daughter, [name], was present to help provide information about your condition.  YOUR PLAN:  -ACUTE KIDNEY INJURY: Acute kidney injury means your kidneys are not working as well as they should, likely due to dehydration and medication effects. To help improve your kidney function, you should drink six bottles of water daily. We will recheck your labs on Monday and may consider stopping your medication, losartan , temporarily.  -HYPONATREMIA: Hyponatremia means your sodium levels are too low, which can cause confusion and decreased awareness. You will take sodium supplements twice daily for three days, and we will recheck your sodium levels on Monday. If you notice any significant changes in your mental status, please seek medical attention immediately.  -HYPERKALEMIA: Hyperkalemia means your potassium levels are too high, which can be dangerous. We will monitor your potassium levels and recheck them on Monday. We may consider stopping your medication, losartan , to help reduce your potassium levels.  -LEG SWELLING: Your leg swelling is likely due to fluid retention and not keeping your legs elevated enough during sleep. Please try to elevate your legs during the day and at night. Continue taking spironolactone  to help manage the fluid retention.  -FATIGUE: Your fatigue is likely due to low sodium, dehydration, and recent illness. To help improve your energy levels, make sure you drink six bottles of water daily. We will monitor your energy levels and adjust your care as needed.  -HOARSENESS: Your hoarseness has persisted since your fall. If it continues, we may need to refer you to an ENT specialist to check your vocal cords.  INSTRUCTIONS:  Please follow up with lab tests on Monday to check your kidney function and electrolyte  levels. We will also coordinate an EMT wellness visit over the weekend to monitor your vitals and ensure your safety.

## 2023-08-19 DIAGNOSIS — E871 Hypo-osmolality and hyponatremia: Secondary | ICD-10-CM | POA: Insufficient documentation

## 2023-08-19 DIAGNOSIS — N1831 Chronic kidney disease, stage 3a: Secondary | ICD-10-CM | POA: Insufficient documentation

## 2023-08-19 DIAGNOSIS — R600 Localized edema: Secondary | ICD-10-CM | POA: Insufficient documentation

## 2023-08-19 NOTE — Progress Notes (Signed)
 LOC[facility]  Provider: Jann Melody  Code Status: DNR Goals of Care:     06/21/2023    9:03 AM  Advanced Directives  Does Patient Have a Medical Advance Directive? Yes  Type of Advance Directive Out of facility DNR (pink MOST or yellow form)  Does patient want to make changes to medical advance directive? No - Patient declined     Chief Complaint  Patient presents with   Medical Management of Chronic Issues    Medical management of Chronic Issues. 1 Week Follow up    HPI: Patient is a 89 y.o. female seen today for an acute visit for Discussed the use of AI scribe software for clinical note transcription with the patient, who gave verbal consent to proceed.  History of Present Illness   Ann Odom "Mitchie" is a 89 year old female who presents with leg swelling and hoarseness following a recent fall. She is accompanied by her daughter, Ann Odom, who is her primary caregiver.  She has persistent leg swelling that improves with elevation. She uses a recliner with a pillow to elevate her legs, but the swelling worsens when she is unable to keep her legs elevated at night. She gets up two to three times a night to use the bathroom and sometimes makes coffee, which may contribute to her sleep disruption and increased urination. She has been taking a diuretic, which initially helped reduce the swelling over the weekend.  She experiences hoarseness that has persisted since a fall. Her voice is not clear when answering the phone, and this change in her voice has been noted by her daughter.  Recent lab work showed a drop in sodium levels and an increase in potassium levels. She drinks two to three small bottles of water a day, which may not be sufficient. Her daughter notes that her energy levels have been lower, particularly when she is not present to assist her mother.  Her current medications include losartan  and spironolactone . She has recently completed a  course of antibiotics.  She eats quiche for breakfast and occasionally uses protein supplements. Her daughter notes that she eats well, including meals like meatloaf, mashed potatoes, and pasta, and snacks throughout the day. However, she does not consistently remember her meals or sleep quality.  She sometimes appears confused or forgetful, though she generally has a sharp mind. She was able to correctly identify the day, month, and year, but struggled with more complex tasks like spelling 'world' backwards.  No burning with urination, stomach pain, foul taste in her throat, or nausea upon waking.        Past Medical History:  Diagnosis Date   Diverticulosis    GERD (gastroesophageal reflux disease)    Hyperlipidemia    Hypertension    Osteoporosis    Polymyalgia rheumatica (HCC)    Wears dentures    partial upper and lower   Wears hearing aid in both ears     Past Surgical History:  Procedure Laterality Date   ANKLE FRACTURE SURGERY  07/12/2005   CATARACT EXTRACTION  2012   right   CATARACT EXTRACTION W/PHACO Left 09/07/2020   Procedure: CATARACT EXTRACTION PHACO AND INTRAOCULAR LENS PLACEMENT (IOC) LEFT 8.30 01:09.7;  Surgeon: Annell Kidney, MD;  Location: [facility];  Service: Ophthalmology;  Laterality: Left;  requests early as possible   TONSILLECTOMY      Allergies  Allergen Reactions   Alendronate Sodium     REACTION: GI  Outpatient Encounter Medications as of 08/18/2023  Medication Sig   acetaminophen  (TYLENOL ) 500 MG tablet Take 1,000 mg by mouth every 8 (eight) hours as needed.   esomeprazole (NEXIUM) 20 MG capsule Take 20 mg by mouth daily at 12 noon.   glucose blood (TRUE METRIX BLOOD GLUCOSE TEST) test strip Use to check blood sugar once daily   melatonin 5 MG TABS Take 8 mg by mouth at bedtime as needed.   Multiple Vitamin (MULTIVITAMIN) capsule Take 1 capsule by mouth daily.   Naproxen Sodium 220 MG CAPS Take 1 capsule by mouth 2 (two)  times daily as needed.   polyethylene glycol (MIRALAX  / GLYCOLAX ) 17 g packet Take 17 g by mouth daily as needed for mild constipation.   prednisoLONE acetate (PRED FORTE) 1 % ophthalmic suspension Place 1 drop into both eyes at bedtime.   sodium chloride  1 g tablet Take 1 tablet (1 g total) by mouth 2 (two) times daily with a meal for 3 days.   spironolactone  (ALDACTONE ) 25 MG tablet Take 1 tablet (25 mg total) by mouth daily.   TRUEplus Lancets 33G MISC Use to obtain blood sugar sample once daily   UNABLE TO FIND Med Name: CBD Oil Give 0.75ml by mouth at bedtime   [DISCONTINUED] furosemide  (LASIX ) 20 MG tablet Take 1 tablet (20 mg total) by mouth daily.   [DISCONTINUED] losartan  (COZAAR ) 50 MG tablet Take 1 tablet (50 mg total) by mouth daily.   [DISCONTINUED] sulfamethoxazole -trimethoprim  (BACTRIM  DS) 800-160 MG tablet Take 1 tablet by mouth 2 (two) times daily for 7 days.   No facility-administered encounter medications on file as of 08/18/2023.    Review of Systems:  Review of Systems  Health Maintenance  Topic Date Due   Zoster Vaccines- Shingrix (1 of 2) 01/20/1943   DTaP/Tdap/Td (2 - Tdap) 07/08/2012   COVID-19 Vaccine (6 - Mixed Product risk 2024-25 season) 06/08/2023   INFLUENZA VACCINE  10/13/2023   Medicare Annual Wellness (AWV)  01/06/2024   Pneumonia Vaccine 71+ Years old  Completed   DEXA SCAN  Completed   HPV VACCINES  Aged Out   Meningococcal B Vaccine  Aged Out    Physical Exam: Vitals:   08/18/23 1023  BP: 132/68  Pulse: 63  Temp: 97.7 F (36.5 C)  SpO2: 95%  Weight: 126 lb (57.2 kg)  Height: 4\' 9"  (1.448 m)   Body mass index is 27.27 kg/m. Physical Exam Pulmonary:     Effort: Pulmonary effort is normal.  Abdominal:     General: Abdomen is flat.  Neurological:     Mental Status: She is alert and oriented to person, place, and time.     Comments: Lacks attention, difficulty following conversation    Physical Exam           Labs  reviewed: Basic Metabolic Panel: Recent Labs    06/16/23 1447 06/17/23 0242 08/03/23 0820 08/14/23 0800 08/17/23 0910  NA 136   < > 135 128* 126*  K 2.8*   < > 4.0 4.6 5.1  CL 100   < > 102 95* 93*  CO2 22   < > 27 24 24   GLUCOSE 112*   < > 92 109* 98  BUN 23   < > 17 23 19   CREATININE 0.89   < > 0.70 1.32* 1.30*  CALCIUM 9.7   < > 9.7 9.8 9.9  MG 2.0  --   --   --   --    < > =  values in this interval not displayed.   Liver Function Tests: Recent Labs    06/16/23 1447 06/17/23 0242 06/18/23 0542 08/03/23 0820  AST 306* 235* 135* 15  ALT 86* 74* 61* 7  ALKPHOS 40 39 35*  --   BILITOT 2.0* 1.4* 0.9 0.5  PROT 6.4* 5.8* 4.9* 5.9*  ALBUMIN 3.8 3.3* 2.8*  --    No results for input(s): "LIPASE", "AMYLASE" in the last 8760 hours. No results for input(s): "AMMONIA" in the last 8760 hours. CBC: Recent Labs    06/18/23 1409 06/22/23 0000 08/03/23 0820 08/14/23 0800  WBC 10.9* 5.3 5.8 4.6  NEUTROABS  --  3,360.00 3,950 3,073  HGB 11.7* 11.4* 11.6* 11.0*  HCT 32.5* 34* 35.2 33.0*  MCV 89.3  --  95.4 94.0  PLT 140* 231 181 209   Lipid Panel: Recent Labs    06/17/23 0242  CHOL 178  HDL 82  LDLCALC 81  TRIG 73  CHOLHDL 2.2   Lab Results  Component Value Date   HGBA1C 5.2 06/16/2023    Procedures since last visit: No results found. Results   LABS Na: 126 mEq/L (08/14/2023) K: 5.1 mEq/L (08/14/2023) Cr: 1.3 mg/dL (14/78/2956) Na: 213 mEq/L (08/07/2023) K: 4.6 mEq/L (08/07/2023) Cr: 0.7 mg/dL (08/65/7846)       Assessment/Plan  Acute kidney injury Acute kidney injury likely due to dehydration and medication effects. Recent labs show a drop in sodium from 135 to 126 and an increase in potassium from 4.6 to 5.1. Creatinine increased from 0.7 to 1.3. Concerns about inadequate hydration and medication effects on kidney function. High potassium levels can be lethal if not addressed. - Ensure adequate hydration with six bottles of water daily. - Recheck  labs on Monday. - Consider stopping losartan  temporarily due to its effect on potassium levels. - Continue spironolactone  to manage fluid retention.  Hyponatremia Hyponatremia with sodium levels dropping from 135 to 126. Likely related to diuretic use and inadequate fluid intake. Potential contribution from excess fluid retention. Low sodium can cause confusion and decreased awareness. - Administer sodium supplements twice daily for three days. - Recheck sodium levels on Monday. - Monitor for changes in mental status and consider hospital admission if significant changes occur.  Hyperkalemia Mild hyperkalemia with potassium levels increasing from 4.6 to 5.1. Likely due to medication effects and dehydration. Current level not critically high but requires monitoring. Losartan  and spironolactone  contribute to potassium retention. - Monitor potassium levels with recheck on Monday. - Consider stopping losartan  to reduce potassium retention.  Leg swelling Intermittent leg swelling, likely due to fluid retention and inadequate leg elevation during sleep. Swelling improves with elevation and worsens when legs are not elevated. Difficulty elevating legs adequately in recliner at night. - Encourage leg elevation during the day and at night. - Continue spironolactone  to manage fluid retention.  Fatigue Fatigue likely multifactorial, including effects of low sodium, dehydration, and recent illness. Energy levels have been variable, with some improvement noted. Low sodium and dehydration can contribute to fatigue. - Ensure adequate hydration with six bottles of water daily. - Monitor energy levels and adjust care as needed.  Hoarseness Persistent hoarseness since a fall. No clear etiology identified. Possible contribution from acid reflux, but no symptoms of reflux reported. ENT evaluation may be necessary to assess vocal cords. - Consider ENT evaluation if hoarseness persists.  Follow-up Follow-up  care to monitor kidney function and electrolyte levels. Coordination of care is necessary due to variable support at home. - Recheck labs  on Monday. - Coordinate EMT wellness visit over the weekend to monitor vitals and ensure safety.       Spoke with campus nursing about respite care for patient to monitor labs more closely. Patient does not desire hospitalization and neither daoes her daughter. Family declines at this time. Labs ordered for Monday. F/u scheduled for Wednesday.   I spent greater than 40 minutes for the care of this patient in face to face time, chart review, clinical documentation, patient education.     Labs/tests ordered:  * No order type specified * Next appt:  08/23/2023

## 2023-08-21 DIAGNOSIS — I10 Essential (primary) hypertension: Secondary | ICD-10-CM | POA: Diagnosis not present

## 2023-08-21 DIAGNOSIS — M6282 Rhabdomyolysis: Secondary | ICD-10-CM | POA: Diagnosis not present

## 2023-08-21 DIAGNOSIS — R600 Localized edema: Secondary | ICD-10-CM | POA: Diagnosis not present

## 2023-08-21 DIAGNOSIS — R2689 Other abnormalities of gait and mobility: Secondary | ICD-10-CM | POA: Diagnosis not present

## 2023-08-21 DIAGNOSIS — M6281 Muscle weakness (generalized): Secondary | ICD-10-CM | POA: Diagnosis not present

## 2023-08-21 DIAGNOSIS — Z9181 History of falling: Secondary | ICD-10-CM | POA: Diagnosis not present

## 2023-08-21 DIAGNOSIS — K219 Gastro-esophageal reflux disease without esophagitis: Secondary | ICD-10-CM | POA: Diagnosis not present

## 2023-08-21 DIAGNOSIS — R4189 Other symptoms and signs involving cognitive functions and awareness: Secondary | ICD-10-CM | POA: Diagnosis not present

## 2023-08-21 DIAGNOSIS — I214 Non-ST elevation (NSTEMI) myocardial infarction: Secondary | ICD-10-CM | POA: Diagnosis not present

## 2023-08-21 DIAGNOSIS — E876 Hypokalemia: Secondary | ICD-10-CM | POA: Diagnosis not present

## 2023-08-21 DIAGNOSIS — R278 Other lack of coordination: Secondary | ICD-10-CM | POA: Diagnosis not present

## 2023-08-21 LAB — BRAIN NATRIURETIC PEPTIDE: Brain Natriuretic Peptide: 51 pg/mL (ref <100)

## 2023-08-22 ENCOUNTER — Telehealth: Payer: Self-pay

## 2023-08-22 DIAGNOSIS — M6281 Muscle weakness (generalized): Secondary | ICD-10-CM | POA: Diagnosis not present

## 2023-08-22 DIAGNOSIS — E871 Hypo-osmolality and hyponatremia: Secondary | ICD-10-CM

## 2023-08-22 DIAGNOSIS — R278 Other lack of coordination: Secondary | ICD-10-CM | POA: Diagnosis not present

## 2023-08-22 DIAGNOSIS — Z9181 History of falling: Secondary | ICD-10-CM | POA: Diagnosis not present

## 2023-08-22 DIAGNOSIS — R4189 Other symptoms and signs involving cognitive functions and awareness: Secondary | ICD-10-CM | POA: Diagnosis not present

## 2023-08-22 DIAGNOSIS — M6282 Rhabdomyolysis: Secondary | ICD-10-CM | POA: Diagnosis not present

## 2023-08-22 DIAGNOSIS — I10 Essential (primary) hypertension: Secondary | ICD-10-CM | POA: Diagnosis not present

## 2023-08-22 DIAGNOSIS — I214 Non-ST elevation (NSTEMI) myocardial infarction: Secondary | ICD-10-CM | POA: Diagnosis not present

## 2023-08-22 DIAGNOSIS — R2689 Other abnormalities of gait and mobility: Secondary | ICD-10-CM | POA: Diagnosis not present

## 2023-08-22 DIAGNOSIS — K219 Gastro-esophageal reflux disease without esophagitis: Secondary | ICD-10-CM | POA: Diagnosis not present

## 2023-08-22 MED ORDER — SODIUM CHLORIDE 1 G PO TABS: 1.0000 g | ORAL_TABLET | Freq: Three times a day (TID) | ORAL | 0 refills | Status: DC

## 2023-08-22 NOTE — Telephone Encounter (Signed)
 Copied from CRM [id number]. Topic: Clinical - Medication Question >> Aug 22, 2023 10:25 AM Carrielelia G wrote: Patient's daughter is calling, she wanted to know does her mother need another rx for sodium chloride  1 g tablet  PLease advise

## 2023-08-23 ENCOUNTER — Encounter: Admitting: Student

## 2023-08-23 ENCOUNTER — Ambulatory Visit: Admitting: Student

## 2023-08-23 ENCOUNTER — Encounter: Payer: Self-pay | Admitting: Student

## 2023-08-23 VITALS — BP 138/72 | HR 84 | Temp 97.4°F | Ht <= 58 in | Wt 123.2 lb

## 2023-08-23 DIAGNOSIS — N179 Acute kidney failure, unspecified: Secondary | ICD-10-CM

## 2023-08-23 DIAGNOSIS — R600 Localized edema: Secondary | ICD-10-CM

## 2023-08-23 DIAGNOSIS — M6282 Rhabdomyolysis: Secondary | ICD-10-CM | POA: Diagnosis not present

## 2023-08-23 DIAGNOSIS — I10 Essential (primary) hypertension: Secondary | ICD-10-CM | POA: Diagnosis not present

## 2023-08-23 DIAGNOSIS — R2689 Other abnormalities of gait and mobility: Secondary | ICD-10-CM | POA: Diagnosis not present

## 2023-08-23 DIAGNOSIS — Z7189 Other specified counseling: Secondary | ICD-10-CM

## 2023-08-23 DIAGNOSIS — I5189 Other ill-defined heart diseases: Secondary | ICD-10-CM

## 2023-08-23 DIAGNOSIS — Z9181 History of falling: Secondary | ICD-10-CM | POA: Diagnosis not present

## 2023-08-23 DIAGNOSIS — I2510 Atherosclerotic heart disease of native coronary artery without angina pectoris: Secondary | ICD-10-CM | POA: Diagnosis not present

## 2023-08-23 DIAGNOSIS — R4189 Other symptoms and signs involving cognitive functions and awareness: Secondary | ICD-10-CM | POA: Diagnosis not present

## 2023-08-23 DIAGNOSIS — I214 Non-ST elevation (NSTEMI) myocardial infarction: Secondary | ICD-10-CM | POA: Diagnosis not present

## 2023-08-23 DIAGNOSIS — M6281 Muscle weakness (generalized): Secondary | ICD-10-CM | POA: Diagnosis not present

## 2023-08-23 DIAGNOSIS — E871 Hypo-osmolality and hyponatremia: Secondary | ICD-10-CM

## 2023-08-23 DIAGNOSIS — K219 Gastro-esophageal reflux disease without esophagitis: Secondary | ICD-10-CM | POA: Diagnosis not present

## 2023-08-23 DIAGNOSIS — N1831 Chronic kidney disease, stage 3a: Secondary | ICD-10-CM

## 2023-08-23 DIAGNOSIS — R278 Other lack of coordination: Secondary | ICD-10-CM | POA: Diagnosis not present

## 2023-08-23 MED ORDER — SODIUM CHLORIDE 1 G PO TABS: 1.0000 g | ORAL_TABLET | Freq: Two times a day (BID) | ORAL | 0 refills | Status: AC

## 2023-08-23 NOTE — Progress Notes (Signed)
 [facility]  Provider: Jann Melody  Code Status: DNR Goals of Care:     06/21/2023    9:03 AM  Advanced Directives  Does Patient Have a Medical Advance Directive? Yes  Type of Advance Directive Out of facility DNR (pink MOST or yellow form)  Does patient want to make changes to medical advance directive? No - Patient declined     Chief Complaint  Patient presents with   Medical Management of Chronic Issues    Medical Management of Chronic Issues. 1 week Follow up with labs.     HPI: Patient is a 89 y.o. female seen today for an acute visit for follow up of hyponatremia. Discussed the use of AI scribe software for clinical note transcription with the patient, who gave verbal consent to proceed.  History of Present Illness   Ann Odom is a 89 year old female who presents for follow-up regarding her sodium levels and medication management.  She has been experiencing hyponatremia with sodium levels dropping to 125 mEq/L. To manage this, she has been prescribed sodium tablets and has been drinking Propel instead of water to maintain electrolyte balance. Her daughter monitors her for signs of somnolence or confusion, which could indicate worsening sodium levels.  She has a history of a heart attack and has been under the care of a cardiologist. She experiences edema in her feet, more pronounced on the left side. Although she attempts to elevate her feet, it is challenging, and she often forgets unless reminded. Her daughter assists her with this when possible.  Her current medications include spironolactone  in the morning, Tylenol , omeprazole at noon, CoQ10, and Aleve at night. She has recently discontinued losartan . Her potassium levels had increased but have since normalized.  She has been more active, walking around stores and attending social events, which marks a significant improvement since her hospitalization on April 4th. She uses a  walker and sometimes manages without it. She experiences some nighttime activity and mild confusion, which her daughter attributes to melatonin use, now reduced to 3 mg.  Socially, she is active, attending monthly luncheons and walking to the mailbox with her daughter. She reports no significant pain currently and no daytime coughing. She prefers to sleep in a recliner rather than lying flat.        Past Medical History:  Diagnosis Date   Diverticulosis    GERD (gastroesophageal reflux disease)    Hyperlipidemia    Hypertension    Osteoporosis    Polymyalgia rheumatica (HCC)    Wears dentures    partial upper and lower   Wears hearing aid in both ears     Past Surgical History:  Procedure Laterality Date   ANKLE FRACTURE SURGERY  07/12/2005   CATARACT EXTRACTION  2012   right   CATARACT EXTRACTION W/PHACO Left 09/07/2020   Procedure: CATARACT EXTRACTION PHACO AND INTRAOCULAR LENS PLACEMENT (IOC) LEFT 8.30 01:09.7;  Surgeon: Annell Kidney, MD;  Location: [facility];  Service: Ophthalmology;  Laterality: Left;  requests early as possible   TONSILLECTOMY      Allergies  Allergen Reactions   Alendronate Sodium     REACTION: GI    Outpatient Encounter Medications as of 08/23/2023  Medication Sig   acetaminophen  (TYLENOL ) 500 MG tablet Take 1,000 mg by mouth every 8 (eight) hours as needed.   esomeprazole (NEXIUM) 20 MG capsule Take 20 mg by mouth daily at 12 noon.   glucose blood (  TRUE METRIX BLOOD GLUCOSE TEST) test strip Use to check blood sugar once daily   melatonin 5 MG TABS Take 8 mg by mouth at bedtime as needed.   Multiple Vitamin (MULTIVITAMIN) capsule Take 1 capsule by mouth daily.   Naproxen Sodium 220 MG CAPS Take 1 capsule by mouth 2 (two) times daily as needed.   polyethylene glycol (MIRALAX  / GLYCOLAX ) 17 g packet Take 17 g by mouth daily as needed for mild constipation.   prednisoLONE acetate (PRED FORTE) 1 % ophthalmic suspension Place 1 drop  into both eyes at bedtime.   sodium chloride  1 g tablet Take 1 tablet (1 g total) by mouth 2 (two) times daily with a meal for 7 days.   spironolactone  (ALDACTONE ) 25 MG tablet Take 1 tablet (25 mg total) by mouth daily.   TRUEplus Lancets 33G MISC Use to obtain blood sugar sample once daily   UNABLE TO FIND Med Name: CBD Oil Give 0.75ml by mouth at bedtime   [DISCONTINUED] sodium chloride  1 g tablet Take 1 tablet (1 g total) by mouth 3 (three) times daily with meals for 7 days.   No facility-administered encounter medications on file as of 08/23/2023.    Review of Systems:  Review of Systems  Health Maintenance  Topic Date Due   Zoster Vaccines- Shingrix (1 of 2) 01/20/1943   DTaP/Tdap/Td (2 - Tdap) 07/08/2012   COVID-19 Vaccine (6 - Mixed Product risk 2024-25 season) 06/08/2023   INFLUENZA VACCINE  10/13/2023   Medicare Annual Wellness (AWV)  01/06/2024   Pneumonia Vaccine 39+ Years old  Completed   DEXA SCAN  Completed   HPV VACCINES  Aged Out   Meningococcal B Vaccine  Aged Out    Physical Exam: Vitals:   08/23/23 1329  BP: 138/72  Pulse: 84  Temp: (!) 97.4 F (36.3 C)  SpO2: 95%  Weight: 123 lb 3.2 oz (55.9 kg)  Height: 4' 9 (1.448 m)   Body mass index is 26.66 kg/m. Gen: Elderly, well-appearing CV: Well-perfused. 3 sec cap refill of bilateral feet HEENT: LCTAB Neuro: Oriented to self, place    Labs reviewed: Basic Metabolic Panel: Recent Labs    06/16/23 1447 06/17/23 0242 08/14/23 0800 08/17/23 0910 08/21/23 0905  NA 136   < > 128* 126* 125*  K 2.8*   < > 4.6 5.1 4.5  CL 100   < > 95* 93* 93*  CO2 22   < > 24 24 24   GLUCOSE 112*   < > 109* 98 92  BUN 23   < > 23 19 14   CREATININE 0.89   < > 1.32* 1.30* 0.83  CALCIUM 9.7   < > 9.8 9.9 9.5  MG 2.0  --   --   --   --    < > = values in this interval not displayed.   Liver Function Tests: Recent Labs    06/16/23 1447 06/17/23 0242 06/18/23 0542 08/03/23 0820  AST 306* 235* 135* 15  ALT 86*  74* 61* 7  ALKPHOS 40 39 35*  --   BILITOT 2.0* 1.4* 0.9 0.5  PROT 6.4* 5.8* 4.9* 5.9*  ALBUMIN 3.8 3.3* 2.8*  --    No results for input(s): LIPASE, AMYLASE in the last 8760 hours. No results for input(s): AMMONIA in the last 8760 hours. CBC: Recent Labs    06/18/23 1409 06/22/23 0000 08/03/23 0820 08/14/23 0800  WBC 10.9* 5.3 5.8 4.6  NEUTROABS  --  3,360.00 3,950 3,073  HGB 11.7* 11.4* 11.6* 11.0*  HCT 32.5* 34* 35.2 33.0*  MCV 89.3  --  95.4 94.0  PLT 140* 231 181 209   Lipid Panel: Recent Labs    06/17/23 0242  CHOL 178  HDL 82  LDLCALC 81  TRIG 73  CHOLHDL 2.2   Lab Results  Component Value Date   HGBA1C 5.2 06/16/2023    Procedures since last visit: No results found. Results   LABS Na: 125 (08/22/2023) BNP: normal (08/22/2023) K: 4.8 (08/22/2023)  DIAGNOSTIC Echocardiogram: Normal function, grade 1 diastolic dysfunction (April 2025)       Assessment/Plan Hyponatremia Hyponatremia with sodium level at 125 mEq/L, likely due to acute kidney injury and medication effects. No severe symptoms such as somnolence or confusion. - Prescribe sodium tablets, 1 tablet twice daily. - Recheck sodium levels in one week- discussed checking sooner, however, due to travel plans adjusting time frame of recheck. - Encourage use of Propel for hydration due to its electrolyte content.  Acute kidney injury Acute kidney injury likely secondary to medication effects, including Aleve, spironolactone , bactrim , and losartan . Potassium levels have normalized. Monitoring and medication adjustments are in place to support recovery. - Discontinue losartan  to avoid further kidney strain. - Consider reducing or discontinuing Aleve due to potential nephrotoxic effects. - Monitor kidney function and adjust medications as needed.  Diastolic dysfunction Grade 1 diastolic dysfunction consistent with age-related changes. Heart function otherwise normal with effective systolic  function. No evidence of heart failure. - Continue monitoring cardiac function and symptoms.  Peripheral edema Peripheral edema, particularly in the left foot, likely related to diastolic dysfunction and reduced mobility. Management includes elevation and compression stockings. - Encourage regular elevation of legs to reduce edema. - Use compression stockings to manage swelling.  CAD Previous heart attack with current management focused on monitoring and lifestyle adjustments. No need for invasive cardiac interventions or regular cardiology follow-up due to effective systolic function. - Discontinue routine follow-up with cardiology unless new symptoms arise. - Continue current cardiac medications and lifestyle modifications.  Goals of Care She prefers to avoid hospitalization and invasive treatments, wishing to remain at home with supportive care, aligning with her current health status and management plan. - Honor her preference to avoid hospitalization and invasive treatments. - Ensure supportive care is in place to manage health conditions at home.       Labs/tests ordered:  * No order type specified * Next appt:  09/01/2023

## 2023-08-23 NOTE — Telephone Encounter (Signed)
 Discussed plan during clinic today.

## 2023-08-23 NOTE — Patient Instructions (Signed)
 VISIT SUMMARY:  Today, we reviewed your sodium levels and medication management. Your sodium levels have been low, and we discussed ways to manage this. We also reviewed your kidney function, heart health, and swelling in your feet. You have been more active recently, which is great to see. We also talked about your goals of care, focusing on staying at home and avoiding hospitalizations.  YOUR PLAN:  -HYPONATREMIA: Hyponatremia means your sodium levels are lower than normal. To manage this, you should take sodium tablets twice daily and drink Propel for hydration. We will recheck your sodium levels in one week.  -ACUTE KIDNEY INJURY: Acute kidney injury means your kidneys are not working as well as they should, likely due to some of your medications. We have stopped losartan  and may reduce or stop Aleve to help your kidneys recover. We will keep monitoring your kidney function.  -DIASTOLIC DYSFUNCTION: Diastolic dysfunction means your heart has a harder time relaxing between beats, which is common with aging. Your heart's pumping function is normal. We will continue to monitor your heart health.  -PERIPHERAL EDEMA: Peripheral edema means you have swelling in your feet, especially on the left side. This is likely due to your heart condition and reduced mobility. To manage this, you should elevate your legs regularly and use compression stockings.  -HEART ATTACK: You had a heart attack in the past, but your heart is currently functioning well. You do not need regular follow-ups with a cardiologist unless new symptoms arise. Continue with your current heart medications and lifestyle changes.  -GOALS OF CARE: You prefer to avoid hospitalizations and invasive treatments, focusing on staying at home with supportive care. We will honor this preference and ensure you have the necessary support at home.  INSTRUCTIONS:  Please take your sodium tablets as prescribed and drink Propel for hydration. We will  recheck your sodium levels in one week. Continue to monitor your kidney function and adjust medications as needed. Elevate your legs regularly and use compression stockings to manage swelling. Continue with your current heart medications and lifestyle changes. If you experience any new symptoms, please contact us  immediately.

## 2023-08-24 ENCOUNTER — Ambulatory Visit: Payer: Self-pay

## 2023-08-24 DIAGNOSIS — M6282 Rhabdomyolysis: Secondary | ICD-10-CM | POA: Diagnosis not present

## 2023-08-24 DIAGNOSIS — M6281 Muscle weakness (generalized): Secondary | ICD-10-CM | POA: Diagnosis not present

## 2023-08-24 DIAGNOSIS — I10 Essential (primary) hypertension: Secondary | ICD-10-CM | POA: Diagnosis not present

## 2023-08-24 DIAGNOSIS — R4189 Other symptoms and signs involving cognitive functions and awareness: Secondary | ICD-10-CM | POA: Diagnosis not present

## 2023-08-24 DIAGNOSIS — R2689 Other abnormalities of gait and mobility: Secondary | ICD-10-CM | POA: Diagnosis not present

## 2023-08-24 DIAGNOSIS — R278 Other lack of coordination: Secondary | ICD-10-CM | POA: Diagnosis not present

## 2023-08-24 DIAGNOSIS — K219 Gastro-esophageal reflux disease without esophagitis: Secondary | ICD-10-CM | POA: Diagnosis not present

## 2023-08-24 DIAGNOSIS — Z9181 History of falling: Secondary | ICD-10-CM | POA: Diagnosis not present

## 2023-08-24 DIAGNOSIS — I214 Non-ST elevation (NSTEMI) myocardial infarction: Secondary | ICD-10-CM | POA: Diagnosis not present

## 2023-08-24 NOTE — Telephone Encounter (Signed)
 Patient had office visit with Dr.Beamer 08/23/2023. Concerns addressed.

## 2023-08-24 NOTE — Telephone Encounter (Signed)
 FYI Only or Action Required?: FYI only for provider  Patient was last seen in primary care on 08/23/2023 by Valrie Gehrig, MD. Called Nurse Triage reporting Advice Only. Symptoms began N/A. Interventions attempted: Other: N/A. Symptoms are: N/A.  Triage Disposition: Information or Advice Only Call  Patient/caregiver understands and will follow disposition?: Yes                                  Copied From CRM [id number]. Reason for Triage: Please call Daughter Theodoro Fisherman she has questions regarding the medication Furosemide  20mg  and spironolactone  (ALDACTONE ) 25 MG tablet.   Please advise  Reason for Disposition  General information question, no triage required and triager able to answer question  Answer Assessment - Initial Assessment Questions Spoke to patient's daughter, Theodoro Fisherman. Daughter inquired if patient should be taking furosemide  or spironolactone . Per MAR, furosemide  has been discontinued. This RN advised daughter that patient should continue taking spironolactone  as prescribed. Daughter verbalized understanding.  Protocols used: Information Only Call - No Triage-A-AH

## 2023-08-29 ENCOUNTER — Ambulatory Visit: Admitting: Medical

## 2023-08-29 DIAGNOSIS — Z9181 History of falling: Secondary | ICD-10-CM | POA: Diagnosis not present

## 2023-08-29 DIAGNOSIS — K219 Gastro-esophageal reflux disease without esophagitis: Secondary | ICD-10-CM | POA: Diagnosis not present

## 2023-08-29 DIAGNOSIS — I10 Essential (primary) hypertension: Secondary | ICD-10-CM | POA: Diagnosis not present

## 2023-08-29 DIAGNOSIS — M6281 Muscle weakness (generalized): Secondary | ICD-10-CM | POA: Diagnosis not present

## 2023-08-29 DIAGNOSIS — I214 Non-ST elevation (NSTEMI) myocardial infarction: Secondary | ICD-10-CM | POA: Diagnosis not present

## 2023-08-29 DIAGNOSIS — R2689 Other abnormalities of gait and mobility: Secondary | ICD-10-CM | POA: Diagnosis not present

## 2023-08-29 DIAGNOSIS — R4189 Other symptoms and signs involving cognitive functions and awareness: Secondary | ICD-10-CM | POA: Diagnosis not present

## 2023-08-29 DIAGNOSIS — R278 Other lack of coordination: Secondary | ICD-10-CM | POA: Diagnosis not present

## 2023-08-29 DIAGNOSIS — M6282 Rhabdomyolysis: Secondary | ICD-10-CM | POA: Diagnosis not present

## 2023-08-30 ENCOUNTER — Encounter: Admitting: Student

## 2023-08-30 DIAGNOSIS — M6282 Rhabdomyolysis: Secondary | ICD-10-CM | POA: Diagnosis not present

## 2023-08-30 DIAGNOSIS — Z9181 History of falling: Secondary | ICD-10-CM | POA: Diagnosis not present

## 2023-08-30 DIAGNOSIS — M6281 Muscle weakness (generalized): Secondary | ICD-10-CM | POA: Diagnosis not present

## 2023-08-30 DIAGNOSIS — R4189 Other symptoms and signs involving cognitive functions and awareness: Secondary | ICD-10-CM | POA: Diagnosis not present

## 2023-08-30 DIAGNOSIS — R278 Other lack of coordination: Secondary | ICD-10-CM | POA: Diagnosis not present

## 2023-08-30 DIAGNOSIS — I214 Non-ST elevation (NSTEMI) myocardial infarction: Secondary | ICD-10-CM | POA: Diagnosis not present

## 2023-08-30 DIAGNOSIS — R2689 Other abnormalities of gait and mobility: Secondary | ICD-10-CM | POA: Diagnosis not present

## 2023-08-30 DIAGNOSIS — I10 Essential (primary) hypertension: Secondary | ICD-10-CM | POA: Diagnosis not present

## 2023-08-30 DIAGNOSIS — K219 Gastro-esophageal reflux disease without esophagitis: Secondary | ICD-10-CM | POA: Diagnosis not present

## 2023-08-31 DIAGNOSIS — R4189 Other symptoms and signs involving cognitive functions and awareness: Secondary | ICD-10-CM | POA: Diagnosis not present

## 2023-08-31 DIAGNOSIS — I10 Essential (primary) hypertension: Secondary | ICD-10-CM | POA: Diagnosis not present

## 2023-08-31 DIAGNOSIS — K219 Gastro-esophageal reflux disease without esophagitis: Secondary | ICD-10-CM | POA: Diagnosis not present

## 2023-08-31 DIAGNOSIS — I214 Non-ST elevation (NSTEMI) myocardial infarction: Secondary | ICD-10-CM | POA: Diagnosis not present

## 2023-08-31 DIAGNOSIS — M6281 Muscle weakness (generalized): Secondary | ICD-10-CM | POA: Diagnosis not present

## 2023-08-31 DIAGNOSIS — Z9181 History of falling: Secondary | ICD-10-CM | POA: Diagnosis not present

## 2023-08-31 DIAGNOSIS — R2689 Other abnormalities of gait and mobility: Secondary | ICD-10-CM | POA: Diagnosis not present

## 2023-08-31 DIAGNOSIS — M6282 Rhabdomyolysis: Secondary | ICD-10-CM | POA: Diagnosis not present

## 2023-08-31 DIAGNOSIS — R278 Other lack of coordination: Secondary | ICD-10-CM | POA: Diagnosis not present

## 2023-08-31 DIAGNOSIS — E871 Hypo-osmolality and hyponatremia: Secondary | ICD-10-CM | POA: Diagnosis not present

## 2023-08-31 LAB — BASIC METABOLIC PANEL WITHOUT GFR
BUN: 14 mg/dL (ref 7–25)
CO2: 22 mmol/L (ref 20–32)
Calcium: 9.4 mg/dL (ref 8.6–10.4)
Chloride: 104 mmol/L (ref 98–110)
Creat: 0.78 mg/dL (ref 0.60–0.95)
Glucose, Bld: 95 mg/dL (ref 65–99)
Potassium: 4.5 mmol/L (ref 3.5–5.3)
Sodium: 134 mmol/L — ABNORMAL LOW (ref 135–146)

## 2023-08-31 LAB — MAGNESIUM: Magnesium: 1.7 mg/dL (ref 1.5–2.5)

## 2023-09-01 ENCOUNTER — Ambulatory Visit: Admitting: Student

## 2023-09-01 ENCOUNTER — Encounter: Payer: Self-pay | Admitting: Student

## 2023-09-01 VITALS — BP 122/68 | HR 75 | Temp 97.5°F | Ht <= 58 in | Wt 124.0 lb

## 2023-09-01 DIAGNOSIS — R600 Localized edema: Secondary | ICD-10-CM | POA: Diagnosis not present

## 2023-09-01 DIAGNOSIS — I10 Essential (primary) hypertension: Secondary | ICD-10-CM

## 2023-09-01 DIAGNOSIS — E876 Hypokalemia: Secondary | ICD-10-CM

## 2023-09-01 NOTE — Patient Instructions (Signed)
 VISIT SUMMARY:  Today, we reviewed your blood pressure and sodium levels. Your blood pressure is mostly in the 120s and 130s, with one higher reading of 151/70. Your sodium levels have improved to 134, and your potassium is stable at 4.5. We discussed your history of kidney injury and how it has affected your sodium levels. You are managing your hydration well with Propel. We also talked about the swelling in your feet and your upcoming trip to the mountains.  YOUR PLAN:  -HYPONATREMIA: Hyponatremia means low sodium levels in the blood. Your sodium levels have improved to 134, and your potassium levels are stable. You can stop taking the sodium tablets. We will check your sodium levels at home in 1-2 weeks and communicate the results via MyChart.  -HYPERTENSION: Hypertension means high blood pressure. Your blood pressure is well-controlled with recent readings mostly in the 120s and 130s. There was one higher reading of 151/70. We will continue to monitor your blood pressure to ensure it stays within a safe range.  -EDEMA: Edema means swelling caused by excess fluid trapped in your body's tissues. You have swelling in your feet, which you are managing by elevating your feet at night. Continue this practice to help reduce the swelling.  -GENERAL HEALTH MAINTENANCE: You are maintaining adequate hydration and electrolyte balance with Propel. Keep up with this practice to stay hydrated and maintain your electrolyte levels.  INSTRUCTIONS:  We will arrange for a home collection of your labs in 2 weeks. Please follow up in person in 1 month to ensure continued stability.

## 2023-09-01 NOTE — Progress Notes (Signed)
 [facility]  Provider: Jann Melody  Code Status: DNR Goals of Care:     06/21/2023    9:03 AM  Advanced Directives  Does Patient Have a Medical Advance Directive? Yes  Type of Advance Directive Out of facility DNR (pink MOST or yellow form)  Does patient want to make changes to medical advance directive? No - Patient declined     Chief Complaint  Patient presents with   Follow-up    1 week follow-up. Here with Care aid and Daughter, Theodoro Fisherman    HPI: Patient is a 89 y.o. female seen today for an acute visit for hyponatremia Discussed the use of AI scribe software for clinical note transcription with the patient, who gave verbal consent to proceed.  History of Present Illness   Ann Odom is a 89 year old female who presents for follow-up of her blood pressure and sodium levels.  Her blood pressure readings have been mostly in the 120s and 130s, with one instance of 151/70 on the 8th. Her sodium levels have improved to 134, and her potassium is at 4.5. She has been taking three sodium tablets daily, but the last dose was taken yesterday or the day before.  She has a history of kidney injury, which was thought to have contributed to her low sodium levels. She maintains adequate hydration at home, aided by the consumption of Propel for electrolytes.  She experiences swelling, primarily in her feet, which varies in severity. She props her feet up at night to manage this. She has aides at night, but this service is being discontinued as she is doing well. She has an upcoming appointment for toenail care, as her toenails are thick and curved, making them difficult to trim.  She is planning a trip to the mountains, which she is excited about. She plans to stop every hour to hour and a half during the journey. She enjoys spending time outdoors and has goals such as brushing in the yard, although she acknowledges the heat as a limiting factor.         Past Medical History:  Diagnosis Date   Diverticulosis    GERD (gastroesophageal reflux disease)    Hyperlipidemia    Hypertension    Osteoporosis    Polymyalgia rheumatica (HCC)    Wears dentures    partial upper and lower   Wears hearing aid in both ears     Past Surgical History:  Procedure Laterality Date   ANKLE FRACTURE SURGERY  07/12/2005   CATARACT EXTRACTION  2012   right   CATARACT EXTRACTION W/PHACO Left 09/07/2020   Procedure: CATARACT EXTRACTION PHACO AND INTRAOCULAR LENS PLACEMENT (IOC) LEFT 8.30 01:09.7;  Surgeon: Annell Kidney, MD;  Location: [facility];  Service: Ophthalmology;  Laterality: Left;  requests early as possible   TONSILLECTOMY      Allergies  Allergen Reactions   Alendronate Sodium     REACTION: GI    Outpatient Encounter Medications as of 09/01/2023  Medication Sig   acetaminophen  (TYLENOL ) 500 MG tablet Take 1,000 mg by mouth every 8 (eight) hours as needed.   esomeprazole (NEXIUM) 20 MG capsule Take 20 mg by mouth daily at 12 noon.   melatonin 5 MG TABS Take 8 mg by mouth at bedtime as needed.   Multiple Vitamin (MULTIVITAMIN) capsule Take 1 capsule by mouth daily.   polyethylene glycol (MIRALAX  / GLYCOLAX ) 17 g packet Take 17 g by mouth daily as  needed for mild constipation.   prednisoLONE acetate (PRED FORTE) 1 % ophthalmic suspension Place 1 drop into both eyes at bedtime.   spironolactone  (ALDACTONE ) 25 MG tablet Take 1 tablet (25 mg total) by mouth daily.   UNABLE TO FIND Med Name: CBD Oil Give 0.75ml by mouth at bedtime   glucose blood (TRUE METRIX BLOOD GLUCOSE TEST) test strip Use to check blood sugar once daily (Patient not taking: Reported on 09/01/2023)   Naproxen Sodium 220 MG CAPS Take 1 capsule by mouth 2 (two) times daily as needed. (Patient not taking: Reported on 09/01/2023)   TRUEplus Lancets 33G MISC Use to obtain blood sugar sample once daily (Patient not taking: Reported on 09/01/2023)   No  facility-administered encounter medications on file as of 09/01/2023.    Review of Systems:  Review of Systems  Health Maintenance  Topic Date Due   Zoster Vaccines- Shingrix (1 of 2) 01/20/1943   DTaP/Tdap/Td (2 - Tdap) 07/08/2012   COVID-19 Vaccine (6 - Mixed Product risk 2024-25 season) 06/08/2023   INFLUENZA VACCINE  10/13/2023   Medicare Annual Wellness (AWV)  01/06/2024   Pneumococcal Vaccine: 50+ Years  Completed   DEXA SCAN  Completed   HPV VACCINES  Aged Out   Meningococcal B Vaccine  Aged Out    Physical Exam: Vitals:   09/01/23 0837  BP: 122/68  Pulse: 75  Temp: (!) 97.5 F (36.4 C)  SpO2: 95%  Weight: 124 lb (56.2 kg)  Height: 4' 9 (1.448 m)   Body mass index is 26.83 kg/m. Physical Exam Constitutional:      Appearance: Normal appearance.   Cardiovascular:     Rate and Rhythm: Normal rate and regular rhythm.     Pulses: Normal pulses.     Heart sounds: Normal heart sounds.  Pulmonary:     Effort: Pulmonary effort is normal.  Abdominal:     General: Abdomen is flat. Bowel sounds are normal.     Palpations: Abdomen is soft.   Musculoskeletal:        General: Swelling present. No tenderness.   Skin:    General: Skin is warm and dry.   Neurological:     Mental Status: She is alert and oriented to person, place, and time.     Gait: Gait normal.   Psychiatric:        Mood and Affect: Mood normal.    Physical Exam           Labs reviewed: Basic Metabolic Panel: Recent Labs    06/16/23 1447 06/17/23 0242 08/17/23 0910 08/21/23 0905 08/31/23 0757  NA 136   < > 126* 125* 134*  K 2.8*   < > 5.1 4.5 4.5  CL 100   < > 93* 93* 104  CO2 22   < > 24 24 22   GLUCOSE 112*   < > 98 92 95  BUN 23   < > 19 14 14   CREATININE 0.89   < > 1.30* 0.83 0.78  CALCIUM 9.7   < > 9.9 9.5 9.4  MG 2.0  --   --   --  1.7   < > = values in this interval not displayed.   Liver Function Tests: Recent Labs    06/16/23 1447 06/17/23 0242 06/18/23 0542  08/03/23 0820  AST 306* 235* 135* 15  ALT 86* 74* 61* 7  ALKPHOS 40 39 35*  --   BILITOT 2.0* 1.4* 0.9 0.5  PROT 6.4* 5.8* 4.9* 5.9*  ALBUMIN 3.8 3.3* 2.8*  --    No results for input(s): LIPASE, AMYLASE in the last 8760 hours. No results for input(s): AMMONIA in the last 8760 hours. CBC: Recent Labs    06/18/23 1409 06/22/23 0000 08/03/23 0820 08/14/23 0800  WBC 10.9* 5.3 5.8 4.6  NEUTROABS  --  3,360.00 3,950 3,073  HGB 11.7* 11.4* 11.6* 11.0*  HCT 32.5* 34* 35.2 33.0*  MCV 89.3  --  95.4 94.0  PLT 140* 231 181 209   Lipid Panel: Recent Labs    06/17/23 0242  CHOL 178  HDL 82  LDLCALC 81  TRIG 73  CHOLHDL 2.2   Lab Results  Component Value Date   HGBA1C 5.2 06/16/2023    Procedures since last visit: No results found. Results   LABS Na: 134 (08/31/2023) K: 4.5 (08/31/2023)       Assessment/Plan     Hyponatremia Sodium levels have improved to 134 mmol/L, attributed to previous kidney injury. Potassium levels are stable at 4.5 mmol/L. Cardiologist advises no salt intake restriction given current blood pressure and heart function. - Discontinue sodium tablets. - Order home collection of sodium levels in 1-2 weeks. - Communicate results via MyChart.  Hypertension Blood pressure is well-controlled with recent readings in the 120s and 130s, except for one isolated reading of 151/70. Risk of falls if diastolic pressure drops below 60.  Edema Persistent edema in feet, managed by elevating feet at night.  General Health Maintenance Adequate hydration and electrolyte balance maintained with Propel for added electrolytes.  Follow-Up She is feeling better. Follow-up planned to ensure continued stability. - Home collection of labs in 2 weeks. - In-person follow-up in 1 month.          Labs/tests ordered:  * No order type specified * Next appt:  09/29/2023

## 2023-09-05 DIAGNOSIS — M6281 Muscle weakness (generalized): Secondary | ICD-10-CM | POA: Diagnosis not present

## 2023-09-05 DIAGNOSIS — I214 Non-ST elevation (NSTEMI) myocardial infarction: Secondary | ICD-10-CM | POA: Diagnosis not present

## 2023-09-05 DIAGNOSIS — M6282 Rhabdomyolysis: Secondary | ICD-10-CM | POA: Diagnosis not present

## 2023-09-05 DIAGNOSIS — R278 Other lack of coordination: Secondary | ICD-10-CM | POA: Diagnosis not present

## 2023-09-05 DIAGNOSIS — R2689 Other abnormalities of gait and mobility: Secondary | ICD-10-CM | POA: Diagnosis not present

## 2023-09-05 DIAGNOSIS — Z9181 History of falling: Secondary | ICD-10-CM | POA: Diagnosis not present

## 2023-09-05 DIAGNOSIS — R4189 Other symptoms and signs involving cognitive functions and awareness: Secondary | ICD-10-CM | POA: Diagnosis not present

## 2023-09-05 DIAGNOSIS — I10 Essential (primary) hypertension: Secondary | ICD-10-CM | POA: Diagnosis not present

## 2023-09-05 DIAGNOSIS — K219 Gastro-esophageal reflux disease without esophagitis: Secondary | ICD-10-CM | POA: Diagnosis not present

## 2023-09-06 DIAGNOSIS — Z9181 History of falling: Secondary | ICD-10-CM | POA: Diagnosis not present

## 2023-09-06 DIAGNOSIS — I10 Essential (primary) hypertension: Secondary | ICD-10-CM | POA: Diagnosis not present

## 2023-09-06 DIAGNOSIS — I214 Non-ST elevation (NSTEMI) myocardial infarction: Secondary | ICD-10-CM | POA: Diagnosis not present

## 2023-09-06 DIAGNOSIS — R4189 Other symptoms and signs involving cognitive functions and awareness: Secondary | ICD-10-CM | POA: Diagnosis not present

## 2023-09-06 DIAGNOSIS — M6281 Muscle weakness (generalized): Secondary | ICD-10-CM | POA: Diagnosis not present

## 2023-09-06 DIAGNOSIS — K219 Gastro-esophageal reflux disease without esophagitis: Secondary | ICD-10-CM | POA: Diagnosis not present

## 2023-09-06 DIAGNOSIS — M6282 Rhabdomyolysis: Secondary | ICD-10-CM | POA: Diagnosis not present

## 2023-09-06 DIAGNOSIS — R278 Other lack of coordination: Secondary | ICD-10-CM | POA: Diagnosis not present

## 2023-09-06 DIAGNOSIS — R2689 Other abnormalities of gait and mobility: Secondary | ICD-10-CM | POA: Diagnosis not present

## 2023-09-07 DIAGNOSIS — M6281 Muscle weakness (generalized): Secondary | ICD-10-CM | POA: Diagnosis not present

## 2023-09-07 DIAGNOSIS — Z9181 History of falling: Secondary | ICD-10-CM | POA: Diagnosis not present

## 2023-09-07 DIAGNOSIS — I10 Essential (primary) hypertension: Secondary | ICD-10-CM | POA: Diagnosis not present

## 2023-09-07 DIAGNOSIS — R2689 Other abnormalities of gait and mobility: Secondary | ICD-10-CM | POA: Diagnosis not present

## 2023-09-07 DIAGNOSIS — M6282 Rhabdomyolysis: Secondary | ICD-10-CM | POA: Diagnosis not present

## 2023-09-07 DIAGNOSIS — I214 Non-ST elevation (NSTEMI) myocardial infarction: Secondary | ICD-10-CM | POA: Diagnosis not present

## 2023-09-07 DIAGNOSIS — R4189 Other symptoms and signs involving cognitive functions and awareness: Secondary | ICD-10-CM | POA: Diagnosis not present

## 2023-09-07 DIAGNOSIS — R278 Other lack of coordination: Secondary | ICD-10-CM | POA: Diagnosis not present

## 2023-09-07 DIAGNOSIS — K219 Gastro-esophageal reflux disease without esophagitis: Secondary | ICD-10-CM | POA: Diagnosis not present

## 2023-09-08 DIAGNOSIS — M6282 Rhabdomyolysis: Secondary | ICD-10-CM | POA: Diagnosis not present

## 2023-09-08 DIAGNOSIS — K219 Gastro-esophageal reflux disease without esophagitis: Secondary | ICD-10-CM | POA: Diagnosis not present

## 2023-09-08 DIAGNOSIS — I214 Non-ST elevation (NSTEMI) myocardial infarction: Secondary | ICD-10-CM | POA: Diagnosis not present

## 2023-09-08 DIAGNOSIS — R278 Other lack of coordination: Secondary | ICD-10-CM | POA: Diagnosis not present

## 2023-09-08 DIAGNOSIS — M6281 Muscle weakness (generalized): Secondary | ICD-10-CM | POA: Diagnosis not present

## 2023-09-08 DIAGNOSIS — R4189 Other symptoms and signs involving cognitive functions and awareness: Secondary | ICD-10-CM | POA: Diagnosis not present

## 2023-09-08 DIAGNOSIS — R2689 Other abnormalities of gait and mobility: Secondary | ICD-10-CM | POA: Diagnosis not present

## 2023-09-08 DIAGNOSIS — Z9181 History of falling: Secondary | ICD-10-CM | POA: Diagnosis not present

## 2023-09-08 DIAGNOSIS — I10 Essential (primary) hypertension: Secondary | ICD-10-CM | POA: Diagnosis not present

## 2023-09-10 ENCOUNTER — Emergency Department

## 2023-09-10 ENCOUNTER — Other Ambulatory Visit: Payer: Self-pay

## 2023-09-10 DIAGNOSIS — M353 Polymyalgia rheumatica: Secondary | ICD-10-CM | POA: Diagnosis not present

## 2023-09-10 DIAGNOSIS — Z8249 Family history of ischemic heart disease and other diseases of the circulatory system: Secondary | ICD-10-CM

## 2023-09-10 DIAGNOSIS — K219 Gastro-esophageal reflux disease without esophagitis: Secondary | ICD-10-CM | POA: Diagnosis present

## 2023-09-10 DIAGNOSIS — I214 Non-ST elevation (NSTEMI) myocardial infarction: Principal | ICD-10-CM | POA: Diagnosis present

## 2023-09-10 DIAGNOSIS — Z833 Family history of diabetes mellitus: Secondary | ICD-10-CM

## 2023-09-10 DIAGNOSIS — Z888 Allergy status to other drugs, medicaments and biological substances status: Secondary | ICD-10-CM

## 2023-09-10 DIAGNOSIS — Z66 Do not resuscitate: Secondary | ICD-10-CM | POA: Diagnosis not present

## 2023-09-10 DIAGNOSIS — R000 Tachycardia, unspecified: Secondary | ICD-10-CM | POA: Diagnosis present

## 2023-09-10 DIAGNOSIS — I517 Cardiomegaly: Secondary | ICD-10-CM | POA: Diagnosis not present

## 2023-09-10 DIAGNOSIS — N179 Acute kidney failure, unspecified: Secondary | ICD-10-CM | POA: Diagnosis not present

## 2023-09-10 DIAGNOSIS — M810 Age-related osteoporosis without current pathological fracture: Secondary | ICD-10-CM | POA: Diagnosis present

## 2023-09-10 DIAGNOSIS — Z8041 Family history of malignant neoplasm of ovary: Secondary | ICD-10-CM

## 2023-09-10 DIAGNOSIS — R0682 Tachypnea, not elsewhere classified: Secondary | ICD-10-CM | POA: Diagnosis present

## 2023-09-10 DIAGNOSIS — R54 Age-related physical debility: Secondary | ICD-10-CM | POA: Diagnosis present

## 2023-09-10 DIAGNOSIS — R140 Abdominal distension (gaseous): Secondary | ICD-10-CM | POA: Diagnosis not present

## 2023-09-10 DIAGNOSIS — M6282 Rhabdomyolysis: Secondary | ICD-10-CM | POA: Diagnosis present

## 2023-09-10 DIAGNOSIS — I252 Old myocardial infarction: Secondary | ICD-10-CM

## 2023-09-10 DIAGNOSIS — D72829 Elevated white blood cell count, unspecified: Secondary | ICD-10-CM | POA: Diagnosis present

## 2023-09-10 DIAGNOSIS — I2510 Atherosclerotic heart disease of native coronary artery without angina pectoris: Secondary | ICD-10-CM | POA: Diagnosis present

## 2023-09-10 DIAGNOSIS — W19XXXA Unspecified fall, initial encounter: Secondary | ICD-10-CM | POA: Diagnosis present

## 2023-09-10 DIAGNOSIS — R0602 Shortness of breath: Secondary | ICD-10-CM | POA: Diagnosis not present

## 2023-09-10 DIAGNOSIS — R451 Restlessness and agitation: Secondary | ICD-10-CM | POA: Diagnosis present

## 2023-09-10 DIAGNOSIS — Z823 Family history of stroke: Secondary | ICD-10-CM

## 2023-09-10 DIAGNOSIS — M25569 Pain in unspecified knee: Secondary | ICD-10-CM | POA: Diagnosis not present

## 2023-09-10 DIAGNOSIS — I110 Hypertensive heart disease with heart failure: Secondary | ICD-10-CM | POA: Diagnosis not present

## 2023-09-10 DIAGNOSIS — J90 Pleural effusion, not elsewhere classified: Secondary | ICD-10-CM | POA: Diagnosis not present

## 2023-09-10 DIAGNOSIS — I950 Idiopathic hypotension: Secondary | ICD-10-CM | POA: Diagnosis present

## 2023-09-10 DIAGNOSIS — Z974 Presence of external hearing-aid: Secondary | ICD-10-CM

## 2023-09-10 DIAGNOSIS — Z515 Encounter for palliative care: Secondary | ICD-10-CM

## 2023-09-10 DIAGNOSIS — Y92129 Unspecified place in nursing home as the place of occurrence of the external cause: Secondary | ICD-10-CM | POA: Diagnosis not present

## 2023-09-10 DIAGNOSIS — R5381 Other malaise: Secondary | ICD-10-CM | POA: Diagnosis present

## 2023-09-10 DIAGNOSIS — I4510 Unspecified right bundle-branch block: Secondary | ICD-10-CM | POA: Diagnosis present

## 2023-09-10 DIAGNOSIS — S80211A Abrasion, right knee, initial encounter: Secondary | ICD-10-CM | POA: Diagnosis present

## 2023-09-10 DIAGNOSIS — I10 Essential (primary) hypertension: Secondary | ICD-10-CM | POA: Diagnosis not present

## 2023-09-10 DIAGNOSIS — Z8261 Family history of arthritis: Secondary | ICD-10-CM

## 2023-09-10 DIAGNOSIS — R4020 Unspecified coma: Secondary | ICD-10-CM | POA: Diagnosis not present

## 2023-09-10 DIAGNOSIS — S80212A Abrasion, left knee, initial encounter: Secondary | ICD-10-CM | POA: Diagnosis present

## 2023-09-10 DIAGNOSIS — E785 Hyperlipidemia, unspecified: Secondary | ICD-10-CM | POA: Diagnosis not present

## 2023-09-10 DIAGNOSIS — R58 Hemorrhage, not elsewhere classified: Secondary | ICD-10-CM | POA: Diagnosis not present

## 2023-09-10 DIAGNOSIS — Z79899 Other long term (current) drug therapy: Secondary | ICD-10-CM

## 2023-09-10 DIAGNOSIS — I5032 Chronic diastolic (congestive) heart failure: Secondary | ICD-10-CM | POA: Diagnosis present

## 2023-09-10 LAB — COMPREHENSIVE METABOLIC PANEL WITH GFR
ALT: 32 U/L (ref 0–44)
AST: 111 U/L — ABNORMAL HIGH (ref 15–41)
Albumin: 4 g/dL (ref 3.5–5.0)
Alkaline Phosphatase: 53 U/L (ref 38–126)
Anion gap: 16 — ABNORMAL HIGH (ref 5–15)
BUN: 28 mg/dL — ABNORMAL HIGH (ref 8–23)
CO2: 15 mmol/L — ABNORMAL LOW (ref 22–32)
Calcium: 10.1 mg/dL (ref 8.9–10.3)
Chloride: 104 mmol/L (ref 98–111)
Creatinine, Ser: 1.61 mg/dL — ABNORMAL HIGH (ref 0.44–1.00)
GFR, Estimated: 29 mL/min — ABNORMAL LOW (ref >60)
Glucose, Bld: 238 mg/dL — ABNORMAL HIGH (ref 70–99)
Potassium: 4.6 mmol/L (ref 3.5–5.1)
Sodium: 135 mmol/L (ref 135–145)
Total Bilirubin: 1.1 mg/dL (ref 0.0–1.2)
Total Protein: 6.6 g/dL (ref 6.5–8.1)

## 2023-09-10 LAB — URINALYSIS, COMPLETE (UACMP) WITH MICROSCOPIC
Bilirubin Urine: NEGATIVE
Glucose, UA: NEGATIVE mg/dL
Ketones, ur: 5 mg/dL — AB
Nitrite: NEGATIVE
Protein, ur: 100 mg/dL — AB
Specific Gravity, Urine: 1.023 (ref 1.005–1.030)
WBC, UA: >50 WBC/hpf (ref 0–5)
pH: 5 (ref 5.0–8.0)

## 2023-09-10 LAB — CBC
HCT: 40.7 % (ref 36.0–46.0)
Hemoglobin: 13.5 g/dL (ref 12.0–15.0)
MCH: 31.8 pg (ref 26.0–34.0)
MCHC: 33.2 g/dL (ref 30.0–36.0)
MCV: 95.8 fL (ref 80.0–100.0)
Platelets: 269 10*3/uL (ref 150–400)
RBC: 4.25 MIL/uL (ref 3.87–5.11)
RDW: 13.1 % (ref 11.5–15.5)
WBC: 16.1 10*3/uL — ABNORMAL HIGH (ref 4.0–10.5)
nRBC: 0 % (ref 0.0–0.2)

## 2023-09-10 LAB — APTT: aPTT: 26 s (ref 24–36)

## 2023-09-10 LAB — CK: Total CK: 5457 U/L — ABNORMAL HIGH (ref 38–234)

## 2023-09-10 LAB — TROPONIN I (HIGH SENSITIVITY)
Troponin I (High Sensitivity): 8270 ng/L (ref <18)
Troponin I (High Sensitivity): 8945 ng/L (ref <18)
Troponin I (High Sensitivity): 18908 ng/L (ref <18)
Troponin I (High Sensitivity): 21216 ng/L (ref <18)

## 2023-09-10 LAB — BRAIN NATRIURETIC PEPTIDE: B Natriuretic Peptide: 538.1 pg/mL — ABNORMAL HIGH (ref 0.0–100.0)

## 2023-09-10 LAB — HEPARIN LEVEL (UNFRACTIONATED): Heparin Unfractionated: <0.1 [IU]/mL — ABNORMAL LOW (ref 0.30–0.70)

## 2023-09-10 LAB — PROTIME-INR
INR: 1 (ref 0.8–1.2)
Prothrombin Time: 13.6 s (ref 11.4–15.2)

## 2023-09-10 LAB — SEDIMENTATION RATE: Sed Rate: 15 mm/h (ref 0–30)

## 2023-09-10 MED ORDER — HEPARIN BOLUS VIA INFUSION
1600.0000 [IU] | Freq: Once | INTRAVENOUS | Status: AC
  Administered 2023-09-10: 1600 [IU] via INTRAVENOUS
  Filled 2023-09-10: qty 1600

## 2023-09-10 MED ORDER — HEPARIN BOLUS VIA INFUSION
3300.0000 [IU] | Freq: Once | INTRAVENOUS | Status: AC
  Administered 2023-09-10: 3300 [IU] via INTRAVENOUS
  Filled 2023-09-10: qty 3300

## 2023-09-10 MED ORDER — LORAZEPAM 2 MG/ML IJ SOLN
0.5000 mg | Freq: Once | INTRAMUSCULAR | Status: AC
  Administered 2023-09-10: 0.5 mg via INTRAVENOUS
  Filled 2023-09-10: qty 1

## 2023-09-10 MED ORDER — SODIUM CHLORIDE 0.9 % IV BOLUS: 500.0000 mL | Freq: Once | INTRAVENOUS | Status: DC

## 2023-09-10 MED ORDER — PANTOPRAZOLE SODIUM 40 MG PO TBEC
40.0000 mg | DELAYED_RELEASE_TABLET | Freq: Every day | ORAL | Status: DC
  Administered 2023-09-10: 40 mg via ORAL
  Filled 2023-09-10: qty 1

## 2023-09-10 MED ORDER — ASPIRIN 81 MG PO TBEC: 81.0000 mg | DELAYED_RELEASE_TABLET | Freq: Every day | ORAL | Status: DC

## 2023-09-10 MED ORDER — MIDODRINE HCL 5 MG PO TABS
5.0000 mg | ORAL_TABLET | Freq: Three times a day (TID) | ORAL | Status: DC
  Administered 2023-09-10: 5 mg via ORAL
  Filled 2023-09-10: qty 1

## 2023-09-10 MED ORDER — MIDODRINE HCL 5 MG PO TABS
10.0000 mg | ORAL_TABLET | Freq: Once | ORAL | Status: AC
  Administered 2023-09-10: 10 mg via ORAL
  Filled 2023-09-10: qty 2

## 2023-09-10 MED ORDER — HALOPERIDOL LACTATE 5 MG/ML IJ SOLN
1.0000 mg | Freq: Four times a day (QID) | INTRAMUSCULAR | Status: DC | PRN
  Administered 2023-09-10: 2 mg via INTRAMUSCULAR
  Filled 2023-09-10: qty 1

## 2023-09-10 MED ORDER — ACETAMINOPHEN 500 MG PO TABS: 1000.0000 mg | ORAL_TABLET | Freq: Three times a day (TID) | ORAL | Status: DC | PRN

## 2023-09-10 MED ORDER — NITROGLYCERIN 0.4 MG SL SUBL
0.4000 mg | SUBLINGUAL_TABLET | SUBLINGUAL | Status: DC | PRN
Start: 2023-09-10 — End: 2023-09-12

## 2023-09-10 MED ORDER — SODIUM CHLORIDE 0.9% FLUSH
10.0000 mL | Freq: Two times a day (BID) | INTRAVENOUS | Status: DC
  Administered 2023-09-10: 20 mL

## 2023-09-10 MED ORDER — HEPARIN (PORCINE) 25000 UT/250ML-% IV SOLN
800.0000 [IU]/h | INTRAVENOUS | Status: DC
  Administered 2023-09-10: 650 [IU]/h via INTRAVENOUS
  Filled 2023-09-10: qty 250

## 2023-09-10 MED ORDER — SODIUM CHLORIDE 0.9% FLUSH: 10.0000 mL | INTRAVENOUS | Status: DC | PRN

## 2023-09-10 MED ORDER — SODIUM CHLORIDE 0.9 % IV SOLN: INTRAVENOUS | Status: DC

## 2023-09-10 MED ORDER — SODIUM CHLORIDE 0.9 % IV BOLUS
500.0000 mL | Freq: Once | INTRAVENOUS | Status: AC
  Administered 2023-09-10: 500 mL via INTRAVENOUS

## 2023-09-10 MED ORDER — ONDANSETRON HCL 4 MG/2ML IJ SOLN: 4.0000 mg | Freq: Four times a day (QID) | INTRAMUSCULAR | Status: DC | PRN

## 2023-09-10 MED ORDER — POLYETHYLENE GLYCOL 3350 17 G PO PACK: 17.0000 g | PACK | Freq: Every day | ORAL | Status: DC | PRN

## 2023-09-10 MED ORDER — ASPIRIN 300 MG RE SUPP: 300.0000 mg | RECTAL | Status: AC

## 2023-09-10 MED ORDER — CLOPIDOGREL BISULFATE 75 MG PO TABS
75.0000 mg | ORAL_TABLET | Freq: Every day | ORAL | Status: DC
  Administered 2023-09-10: 75 mg via ORAL
  Filled 2023-09-10: qty 1

## 2023-09-10 MED ORDER — SODIUM CHLORIDE 0.9 % IV BOLUS
1000.0000 mL | Freq: Once | INTRAVENOUS | Status: AC
  Administered 2023-09-10: 1000 mL via INTRAVENOUS

## 2023-09-10 MED ORDER — ASPIRIN 81 MG PO CHEW
324.0000 mg | CHEWABLE_TABLET | ORAL | Status: AC
  Administered 2023-09-10: 324 mg via ORAL
  Filled 2023-09-10: qty 4

## 2023-09-10 MED ORDER — FENTANYL CITRATE PF 50 MCG/ML IJ SOSY
25.0000 ug | PREFILLED_SYRINGE | Freq: Once | INTRAMUSCULAR | Status: AC
  Administered 2023-09-10: 25 ug via INTRAMUSCULAR
  Filled 2023-09-10: qty 1

## 2023-09-10 MED ORDER — FENTANYL CITRATE PF 50 MCG/ML IJ SOSY: 25.0000 ug | PREFILLED_SYRINGE | Freq: Once | INTRAMUSCULAR | Status: DC

## 2023-09-10 NOTE — Consult Note (Signed)
 PHARMACY - ANTICOAGULATION CONSULT NOTE  Pharmacy Consult for heparin  Indication: chest pain/ACS  Allergies  Allergen Reactions   Alendronate Sodium     REACTION: GI    Patient Measurements: Height: 4' 9 (144.8 cm) Weight: 56.2 kg (124 lb) IBW/kg (Calculated) : 38.6 HEPARIN  DW (KG): 50.6  Vital Signs: Temp: 98 F (36.7 C) (06/29 1600) Temp Source: Oral (06/29 1600) BP: 132/96 (06/29 1800) Pulse Rate: 81 (06/29 1630)  Labs: Recent Labs    09/10/23 0929 09/10/23 1042 09/10/23 1128 09/10/23 1450 09/10/23 1618 09/10/23 1631  HGB 13.5  --   --   --   --   --   HCT 40.7  --   --   --   --   --   PLT 269  --   --   --   --   --   APTT  --   --  26  --   --   --   LABPROT  --   --  13.6  --   --   --   INR  --   --  1.0  --   --   --   HEPARINUNFRC  --   --   --   --   --  <0.10*  CREATININE 1.61*  --   --   --   --   --   CKTOTAL 5,457*  --   --   --   --   --   TROPONINIHS 8,270* 8,945*  --  18,908* 21,216*  --     Estimated Creatinine Clearance: 14.4 mL/min (A) (by C-G formula based on SCr of 1.61 mg/dL (H)).   Medical History: Past Medical History:  Diagnosis Date   Diverticulosis    GERD (gastroesophageal reflux disease)    Hyperlipidemia    Hypertension    Osteoporosis    Polymyalgia rheumatica (HCC)    Wears dentures    partial upper and lower   Wears hearing aid in both ears     Medications:  - Not on anticoagulation at home per chart review.  - Unsure if she is currently taking aspirin    Assessment: 89 YO female admitted for ACS. PMH includes NSTEMI 06/2023 (medical management), HTN, HLD, HFpEF, GERD, rhabdomyolysis, diverticulosis, osteoporosis. Hgb 13.5, plts 269. Troponin 8270 > 8945, CK 5457. EKG without significant ST elevation. Creatinine elevated (baseline ~0.8) to 1.61.   6/29 1631  HL <0.10  Goal of Therapy:  Heparin  level 0.3-0.7 units/ml Monitor platelets by anticoagulation protocol: Yes   Plan:  - HL is SUBtherapeutic - Heparin   bolus 1600 units  - Increase heparin  infusion to 800 units/hr  - Check heparin  level 8 hours after rate change  - Daily CBC  - Monitor for s/sx of bleeding   Olam Fritter, PharmD, BCPS 09/10/2023 7:22 PM

## 2023-09-10 NOTE — ED Notes (Signed)
 Attending notified of soft BP, please see new orders

## 2023-09-10 NOTE — ED Notes (Signed)
 Pt cleaned up as brief soaked from facility. Pericare applied and Pt placed in a gown

## 2023-09-10 NOTE — ED Notes (Addendum)
 Pts family member came out to nurses station and asked this RN to go to pts bedisde because pt was pulling at things and restless. Dr Lawence notified and he gave this RN a verbal order for 0.5 mg of ativan.

## 2023-09-10 NOTE — ED Notes (Signed)
 Fall precautions in place for Pt. This RN placed fall band, fall grip socks, bed alarm and fall sign.

## 2023-09-10 NOTE — Progress Notes (Signed)

## 2023-09-10 NOTE — Consult Note (Signed)
 Cardiology Consultation   [name] MRN: [id number]; DOB: 27-Apr-1923  Admit date: 09/10/2023 Date of Consult: 09/10/2023  PCP:  Abdul Fine, MD    [facility] Providers Cardiologist:  None        Patient Profile: Ann Odom is a 89 y.o. female with a hx of hyperlipidemia, osteoporosis who is being seen 09/10/2023 for the evaluation of elevated troponins at the request of Dr. Debby.  History of Present Illness: Ms. Rynders hypertension, hyperlipidemia, osteoporosis presenting with elevated troponins.  Patient lives in a skilled nursing facility, was found by her aide on her knees earlier today.  She denies falling, states getting down and having difficulty waking up.  Unsure how long she was on the ground for.    She was brought to the ED where initial EKG showed sinus tachycardia heart rate 107, CK elevated at 5457, initial troponins 8270, 8945, 18908.  Patient started on heparin  drip per ACS protocol.  BP low in ED requiring a dose of midodrine 10 mg x 1.  Started on IVF.  She denies chest pain.  Daughter and niece at bedside who helped with history.  Echocardiogram was ordered but family will not want any invasive procedure or repeat echo.  Echo 2 months ago showed EF 60 to 65%.  Has a history of low potassium, started on Aldactone  by PCP.   Of note she had a similar occurrence/presentation 2 months ago after being found down.   Past Medical History:  Diagnosis Date   Diverticulosis    GERD (gastroesophageal reflux disease)    Hyperlipidemia    Hypertension    Osteoporosis    Polymyalgia rheumatica (HCC)    Wears dentures    partial upper and lower   Wears hearing aid in both ears     Past Surgical History:  Procedure Laterality Date   ANKLE FRACTURE SURGERY  07/12/2005   CATARACT EXTRACTION  2012   right   CATARACT EXTRACTION W/PHACO Left 09/07/2020   Procedure: CATARACT EXTRACTION PHACO AND INTRAOCULAR LENS PLACEMENT (IOC) LEFT 8.30  01:09.7;  Surgeon: Mittie Gaskin, MD;  Location: [facility];  Service: Ophthalmology;  Laterality: Left;  requests early as possible   TONSILLECTOMY       Home Medications:  Prior to Admission medications   Medication Sig Start Date End Date Taking? Authorizing Provider  acetaminophen  (TYLENOL ) 500 MG tablet Take 1,000 mg by mouth every 8 (eight) hours as needed.   Yes [provider]  esomeprazole (NEXIUM) 20 MG capsule Take 20 mg by mouth daily at 12 noon.   Yes [provider]  melatonin 5 MG TABS Take 8 mg by mouth at bedtime as needed.   Yes [provider]  Multiple Vitamin (MULTIVITAMIN) capsule Take 1 capsule by mouth daily.   Yes [provider]  spironolactone  (ALDACTONE ) 25 MG tablet Take 1 tablet (25 mg total) by mouth daily. 07/20/23 10/18/23 Yes Caro Harlene POUR, NP  glucose blood (TRUE METRIX BLOOD GLUCOSE TEST) test strip Use to check blood sugar once daily Patient not taking: Reported on 09/01/2023 02/23/21   Letvak, Richard I, MD  Naproxen Sodium 220 MG CAPS Take 1 capsule by mouth 2 (two) times daily as needed. Patient not taking: Reported on 09/01/2023    [provider]  polyethylene glycol (MIRALAX  / GLYCOLAX ) 17 g packet Take 17 g by mouth daily as needed for mild constipation. Patient not taking: Reported on 09/10/2023 06/20/23   Caleen Qualia, MD  prednisoLONE acetate (PRED FORTE) 1 % ophthalmic suspension Place 1 drop into both eyes at bedtime. Patient not taking: Reported on 09/10/2023 11/28/22   [provider]  TRUEplus Lancets 33G MISC Use to obtain blood sugar sample once daily Patient not taking: Reported on 09/01/2023 02/23/21   Jimmy Charlie FERNS, MD  UNABLE TO FIND Med Name: CBD Oil Give 0.75ml by mouth at bedtime    [provider]    Scheduled Meds:  [START ON 10/01/23] aspirin  EC  81 mg Oral Daily   pantoprazole   40 mg Oral Daily   sodium chloride  flush  10-40 mL Intracatheter Q12H    Continuous Infusions:  sodium chloride      heparin  650 Units/hr (09/10/23 1449)   PRN Meds: acetaminophen , nitroGLYCERIN, ondansetron  (ZOFRAN ) IV, polyethylene glycol, sodium chloride  flush  Allergies:    Allergies  Allergen Reactions   Alendronate Sodium     REACTION: GI    Social History:   Social History   Socioeconomic History   Marital status: Widowed    Spouse name: Not on file   Number of children: 2   Years of education: Not on file   Highest education level: Not on file  Occupational History   Occupation: House wife, farming  Tobacco Use   Smoking status: Never    Passive exposure: Never   Smokeless tobacco: Never  Vaping Use   Vaping status: Never Used  Substance and Sexual Activity   Alcohol use: Yes    Alcohol/week: 0.0 standard drinks of alcohol    Comment: wine   Drug use: No   Sexual activity: Not on file  Other Topics Concern   Not on file  Social History Narrative   Has living will   1 living daughter is  health care POA.     Has DNR already   Request no feeding tube.  Does not want extended ventilator support.   Social Drivers of Corporate investment banker Strain: Not on file  Food Insecurity: No Food Insecurity (06/17/2023)   Hunger Vital Sign    Worried About Running Out of Food in the Last Year: Never true    Ran Out of Food in the Last Year: Never true  Transportation Needs: No Transportation Needs (06/17/2023)   PRAPARE - Administrator, Civil Service (Medical): No    Lack of Transportation (Non-Medical): No  Physical Activity: Not on file  Stress: Not on file  Social Connections: Unknown (06/17/2023)   Social Connection and Isolation Panel    Frequency of Communication with Friends and Family: More than three times a week    Frequency of Social Gatherings with Friends and Family: More than three times a week    Attends Religious Services: Patient unable to answer    Active Member of Clubs or Organizations: Patient unable  to answer    Attends Banker Meetings: Patient unable to answer    Marital Status: Married  Catering manager Violence: Not At Risk (06/17/2023)   Humiliation, Afraid, Rape, and Kick questionnaire    Fear of Current or Ex-Partner: No    Emotionally Abused: No    Physically Abused: No    Sexually Abused: No    Family History:    Family History  Problem Relation Age of Onset   Arthritis Mother    Heart disease Father 14   Diabetes Brother    Cancer Sister        ovarian   Stroke Sister  Heart disease Daughter    Rheum arthritis Daughter      ROS:  Please see the history of present illness.   All other ROS reviewed and negative.     Physical Exam/Data: Vitals:   09/10/23 1333 09/10/23 1337 09/10/23 1445 09/10/23 1500  BP:  98/84 (!) 117/91 116/76  Pulse:  94 90 85  Resp:  (!) 28 (!) 38 (!) 40  Temp: (!) 97.5 F (36.4 C)     TempSrc: Axillary     SpO2:  98% 96% 99%  Weight:      Height:        Intake/Output Summary (Last 24 hours) at 09/10/2023 1548 Last data filed at 09/10/2023 1449 Gross per 24 hour  Intake 549.39 ml  Output --  Net 549.39 ml      09/10/2023    8:37 AM 09/01/2023    8:37 AM 08/23/2023    1:29 PM  Last 3 Weights  Weight (lbs) 124 lb 124 lb 123 lb 3.2 oz  Weight (kg) 56.246 kg 56.246 kg 55.883 kg     Body mass index is 26.83 kg/m.  General: Appears weak, frail, soft-spoken. HEENT: Hard of hearing. Neck: no JVD Vascular: No carotid bruits; Distal pulses 2+ bilaterally Cardiac:  normal S1, S2; RRR; no murmur  Lungs: Poor inspiratory effort, no wheezing Abd: soft, nontender, no hepatomegaly  Ext: 1+ edema Musculoskeletal:  No deformities, BUE and BLE strength normal and equal Skin: warm and dry  Neuro:  CNs 2-12 intact, no focal abnormalities noted Psych:  Normal affect   EKG:  The EKG was personally reviewed and demonstrates: Sinus tachycardia Telemetry:  Telemetry was personally reviewed and demonstrates: Sinus  rhythm  Relevant CV Studies: TTE 06/2023 1. Left ventricular ejection fraction, by estimation, is 60 to 65%. The  left ventricle has normal function. The left ventricle has no regional  wall motion abnormalities. There is mild left ventricular hypertrophy.  Left ventricular diastolic parameters  are consistent with Grade I diastolic dysfunction (impaired relaxation).   2. Right ventricular systolic function is normal. The right ventricular  size is normal.   3. Left atrial size was mildly dilated.   4. The mitral valve is normal in structure. Mild mitral valve  regurgitation.   5. The aortic valve is tricuspid. Aortic valve regurgitation is not  visualized. Aortic valve sclerosis is present, with no evidence of aortic  valve stenosis.   6. The inferior vena cava is normal in size with greater than 50%  respiratory variability, suggesting right atrial pressure of 3 mmHg.   Laboratory Data: High Sensitivity Troponin:   Recent Labs  Lab 09/10/23 0929 09/10/23 1042 09/10/23 1450  TROPONINIHS 8,270* 8,945* 18,908*     Chemistry Recent Labs  Lab 09/10/23 0929  NA 135  K 4.6  CL 104  CO2 15*  GLUCOSE 238*  BUN 28*  CREATININE 1.61*  CALCIUM 10.1  GFRNONAA 29*  ANIONGAP 16*    Recent Labs  Lab 09/10/23 0929  PROT 6.6  ALBUMIN 4.0  AST 111*  ALT 32  ALKPHOS 53  BILITOT 1.1   Lipids No results for input(s): CHOL, TRIG, HDL, LABVLDL, LDLCALC, CHOLHDL in the last 168 hours.  Hematology Recent Labs  Lab 09/10/23 0929  WBC 16.1*  RBC 4.25  HGB 13.5  HCT 40.7  MCV 95.8  MCH 31.8  MCHC 33.2  RDW 13.1  PLT 269   Thyroid No results for input(s): TSH, FREET4 in the last 168 hours.  BNP  Recent Labs  Lab 09/10/23 0929  BNP 538.1*    DDimer No results for input(s): DDIMER in the last 168 hours.  Radiology/Studies:  DG Knee Complete 4 Views Right Result Date: 09/10/2023 CLINICAL DATA:  89 year old female found down. EXAM: RIGHT KNEE - COMPLETE  4+ VIEW COMPARISON:  Right knee series 12/09/2013. FINDINGS: Four views. Bone mineralization, joint spaces and alignment are normal for age. No evidence of joint effusion on the lateral view. Patella appears intact. No acute osseous abnormality identified. IMPRESSION: No acute fracture or dislocation identified about the right knee. Electronically Signed   By: VEAR Hurst M.D.   On: 09/10/2023 09:18   CT HEAD WO CONTRAST ( ) Result Date: 09/10/2023 CLINICAL DATA:  89 year old female found down. EXAM: CT HEAD WITHOUT CONTRAST TECHNIQUE: Contiguous axial images were obtained from the base of the skull through the vertex without intravenous contrast. RADIATION DOSE REDUCTION: This exam was performed according to the departmental dose-optimization program which includes automated exposure control, adjustment of the mA and/or kV according to patient size and/or use of iterative reconstruction technique. COMPARISON:  Head CT 06/16/2023. FINDINGS: Motion artifact at the skull base, base of the brain. Brain: Stable cerebral volume. No midline shift, ventriculomegaly, mass effect, evidence of mass lesion, intracranial hemorrhage or evidence of cortically based acute infarction. Stable gray-white matter differentiation throughout the brain. Vascular: No suspicious intracranial vascular hyperdensity. Skull: Motion artifact at the skull base. No acute osseous abnormality identified. Sinuses/Orbits: Visualized paranasal sinuses and mastoids are stable and well aerated. Other: Orbit motion artifact.  Scalp soft tissues appears stable. IMPRESSION: Motion artifact at the skull base. No acute intracranial abnormality or acute traumatic injury identified. Electronically Signed   By: VEAR Hurst M.D.   On: 09/10/2023 09:17     Assessment and Plan: NSTEMI - Denies chest pain - Troponins  8270, 8945, 81091. -Continue aspirin , heparin  drip x 48hrs. -Due to rising troponins, will start Plavix. -Plan for long-term DAPT with aspirin   81, Plavix 75 daily. - Echo 06/2023 EF 60 to 65% - Discussed with patient, family/daughter at bedside.  Family does not want any cardiac procedures including echo, cardiac cath at this time.  Which I fully understand and think is reasonable.  Favor conservative measures/medical management as above.  2.  History of hypertension - BP low requiring midodrine. - will start Midodrine 5 mg 3 times daily.  3.  Fall, CK elevation, rhabdo. - IVF, management as per medicine team   Signed, Redell Cave, MD  09/10/2023 3:48 PM

## 2023-09-10 NOTE — H&P (Addendum)
 History and Physical    Ann Odom FMW:[id number] DOB: May 23, 1923 DOA: 09/10/2023  PCP: Abdul Fine, MD  Patient coming from: SNF  I have personally briefly reviewed patient's old medical records in [facility]  Chief Complaint: unwitnessed  fall   HPI: Ann Odom is a 89 y.o. female with medical history significant of  GERD, PMR, HTN , HLD, CHFpef grade I diastolic dysfunction, CAD s/p recent  NSTEMI 4/25), Osteoporosis who presents to ED after being found down on her knees in her room after having a fall hours before pr SNF.  In the field afeb, hr 110, sat 95% on 3L fs 226.  Patient has pain in b/l lower extremity s/p fall. She denies any LOC of head strike per chart.   ED Course:  Afeb, Bp 127/59, hr 112, sat 99%   CTH IMPRESSION: Motion artifact at the skull base. No acute intracranial abnormality or acute traumatic injury identified.   TX Fentanyl  25 mcg  Right knee xray: IMPRESSION: No acute fracture or dislocation identified about the right knee.  WBC 16.1/ hgb 13.5, plt 269 NA 135, K 4.6, Cl 104, cr 1.61 (0.78) ,AST 111, CK 5457 HR 111 EKG NSR RBBB CE 8945  Review of Systems: As per HPI otherwise 10 point review of systems negative.   Past Medical History:  Diagnosis Date   Diverticulosis    GERD (gastroesophageal reflux disease)    Hyperlipidemia    Hypertension    Osteoporosis    Polymyalgia rheumatica (HCC)    Wears dentures    partial upper and lower   Wears hearing aid in both ears     Past Surgical History:  Procedure Laterality Date   ANKLE FRACTURE SURGERY  07/12/2005   CATARACT EXTRACTION  2012   right   CATARACT EXTRACTION W/PHACO Left 09/07/2020   Procedure: CATARACT EXTRACTION PHACO AND INTRAOCULAR LENS PLACEMENT (IOC) LEFT 8.30 01:09.7;  Surgeon: Mittie Gaskin, MD;  Location: [facility];  Service: Ophthalmology;  Laterality: Left;  requests early as possible   TONSILLECTOMY       reports that she has  never smoked. She has never been exposed to tobacco smoke. She has never used smokeless tobacco. She reports current alcohol use. She reports that she does not use drugs.  Allergies  Allergen Reactions   Alendronate Sodium     REACTION: GI    Family History  Problem Relation Age of Onset   Arthritis Mother    Heart disease Father 89   Diabetes Brother    Cancer Sister        ovarian   Stroke Sister    Heart disease Daughter    Rheum arthritis Daughter     Prior to Admission medications   Medication Sig Start Date End Date Taking? Authorizing Provider  acetaminophen  (TYLENOL ) 500 MG tablet Take 1,000 mg by mouth every 8 (eight) hours as needed.    [provider]  esomeprazole (NEXIUM) 20 MG capsule Take 20 mg by mouth daily at 12 noon.    [provider]  glucose blood (TRUE METRIX BLOOD GLUCOSE TEST) test strip Use to check blood sugar once daily Patient not taking: Reported on 09/01/2023 02/23/21   Letvak, Richard I, MD  melatonin 5 MG TABS Take 8 mg by mouth at bedtime as needed.    [provider]  Multiple Vitamin (MULTIVITAMIN) capsule Take 1 capsule by mouth daily.    [provider]  Naproxen Sodium 220 MG CAPS Take  1 capsule by mouth 2 (two) times daily as needed. Patient not taking: Reported on 09/01/2023    [provider]  polyethylene glycol (MIRALAX  / GLYCOLAX ) 17 g packet Take 17 g by mouth daily as needed for mild constipation. 06/20/23   Amin, Sumayya, MD  prednisoLONE acetate (PRED FORTE) 1 % ophthalmic suspension Place 1 drop into both eyes at bedtime. 11/28/22   [provider]  spironolactone  (ALDACTONE ) 25 MG tablet Take 1 tablet (25 mg total) by mouth daily. 07/20/23 10/18/23  Caro Harlene POUR, NP  TRUEplus Lancets 33G MISC Use to obtain blood sugar sample once daily Patient not taking: Reported on 09/01/2023 02/23/21   Jimmy Charlie FERNS, MD  UNABLE TO FIND Med Name: CBD Oil Give 0.30ml by mouth at bedtime     [provider]    Physical Exam: Vitals:   09/10/23 1038 09/10/23 1100 09/10/23 1115 09/10/23 1130  BP: 138/73 114/82  122/82  Pulse: (!) 103 (!) 101 96 94  Resp: (!) 33 15 (!) 35 (!) 34  Temp:      TempSrc:      SpO2: 97% 98% 98% 95%  Weight:      Height:        Constitutional: NAD, calm, comfortable Vitals:   09/10/23 1038 09/10/23 1100 09/10/23 1115 09/10/23 1130  BP: 138/73 114/82  122/82  Pulse: (!) 103 (!) 101 96 94  Resp: (!) 33 15 (!) 35 (!) 34  Temp:      TempSrc:      SpO2: 97% 98% 98% 95%  Weight:      Height:       Eyes: Pupils equal, lids and conjunctivae normal ENMT: Mucous membranes are dry Neck: normal, supple, no masses, no thyromegaly Respiratory: clear to auscultation bilaterally, no wheezing, no crackles. Normal respiratory effort. No accessory muscle use.  Cardiovascular: Regular rate and rhythm, no murmurs / rubs / gallops. No extremity edema. 2+ pedal pulses. Abdomen: no tenderness, no masses palpated. No hepatosplenomegaly. Bowel sounds positive.  Musculoskeletal: no clubbing / cyanosis. No joint deformity upper and lower extremities. Good ROM, no contractures. Normal muscle tone.  Skin: no rashes, lesions, ulcers. No induration/ abrasions on lower extremity  Neurologic: CN grossly intact. Sensation intact, Strength 5/5 in all 4.  Psychiatric: somnolent  Labs on Admission: I have personally reviewed following labs and imaging studies  CBC: Recent Labs  Lab 09/10/23 0929  WBC 16.1*  HGB 13.5  HCT 40.7  MCV 95.8  PLT 269   Basic Metabolic Panel: Recent Labs  Lab 09/10/23 0929  NA 135  K 4.6  CL 104  CO2 15*  GLUCOSE 238*  BUN 28*  CREATININE 1.61*  CALCIUM 10.1   GFR: Estimated Creatinine Clearance: 14.4 mL/min (A) (by C-G formula based on SCr of 1.61 mg/dL (H)). Liver Function Tests: Recent Labs  Lab 09/10/23 0929  AST 111*  ALT 32  ALKPHOS 53  BILITOT 1.1  PROT 6.6  ALBUMIN 4.0   No results for input(s):  LIPASE, AMYLASE in the last 168 hours. No results for input(s): AMMONIA in the last 168 hours. Coagulation Profile: Recent Labs  Lab 09/10/23 1128  INR 1.0   Cardiac Enzymes: Recent Labs  Lab 09/10/23 0929  CKTOTAL 5,457*   BNP (last 3 results) No results for input(s): PROBNP in the last 8760 hours. HbA1C: No results for input(s): HGBA1C in the last 72 hours. CBG: No results for input(s): GLUCAP in the last 168 hours. Lipid Profile: No results  for input(s): CHOL, HDL, LDLCALC, TRIG, CHOLHDL, LDLDIRECT in the last 72 hours. Thyroid Function Tests: No results for input(s): TSH, T4TOTAL, FREET4, T3FREE, THYROIDAB in the last 72 hours. Anemia Panel: No results for input(s): VITAMINB12, FOLATE, FERRITIN, TIBC, IRON, RETICCTPCT in the last 72 hours. Urine analysis:    Component Value Date/Time   COLORURINE YELLOW 08/11/2023 0845   APPEARANCEUR CLOUDY (A) 08/11/2023 0845   APPEARANCEUR Turbid (A) 08/29/2022 1414   LABSPEC 1.013 08/11/2023 0845   PHURINE 5.5 08/11/2023 0845   GLUCOSEU NEGATIVE 08/11/2023 0845   HGBUR NEGATIVE 08/11/2023 0845   BILIRUBINUR Neg 08/11/2023 0857   BILIRUBINUR Negative 08/29/2022 1414   KETONESUR NEGATIVE 08/11/2023 0845   PROTEINUR Positive (A) 08/11/2023 0857   PROTEINUR NEGATIVE 08/11/2023 0845   UROBILINOGEN negative (A) 08/11/2023 0857   NITRITE Postive 08/11/2023 0857   NITRITE POSITIVE (A) 08/11/2023 0845   LEUKOCYTESUR Moderate (2+) (A) 08/11/2023 0857   LEUKOCYTESUR 3+ (A) 08/11/2023 0845    Radiological Exams on Admission: DG Knee Complete 4 Views Right Result Date: 09/10/2023 CLINICAL DATA:  89 year old female found down. EXAM: RIGHT KNEE - COMPLETE 4+ VIEW COMPARISON:  Right knee series 12/09/2013. FINDINGS: Four views. Bone mineralization, joint spaces and alignment are normal for age. No evidence of joint effusion on the lateral view. Patella appears intact. No acute osseous abnormality  identified. IMPRESSION: No acute fracture or dislocation identified about the right knee. Electronically Signed   By: VEAR Hurst M.D.   On: 09/10/2023 09:18   CT HEAD WO CONTRAST ( ) Result Date: 09/10/2023 CLINICAL DATA:  89 year old female found down. EXAM: CT HEAD WITHOUT CONTRAST TECHNIQUE: Contiguous axial images were obtained from the base of the skull through the vertex without intravenous contrast. RADIATION DOSE REDUCTION: This exam was performed according to the departmental dose-optimization program which includes automated exposure control, adjustment of the mA and/or kV according to patient size and/or use of iterative reconstruction technique. COMPARISON:  Head CT 06/16/2023. FINDINGS: Motion artifact at the skull base, base of the brain. Brain: Stable cerebral volume. No midline shift, ventriculomegaly, mass effect, evidence of mass lesion, intracranial hemorrhage or evidence of cortically based acute infarction. Stable gray-white matter differentiation throughout the brain. Vascular: No suspicious intracranial vascular hyperdensity. Skull: Motion artifact at the skull base. No acute osseous abnormality identified. Sinuses/Orbits: Visualized paranasal sinuses and mastoids are stable and well aerated. Other: Orbit motion artifact.  Scalp soft tissues appears stable. IMPRESSION: Motion artifact at the skull base. No acute intracranial abnormality or acute traumatic injury identified. Electronically Signed   By: VEAR Hurst M.D.   On: 09/10/2023 09:17    EKG: Independently reviewed. See above  Assessment/Plan Principal Problem:   NSTEMI (non-ST elevated myocardial infarction) (HCC)    NSTEMI - hx of recent nstemi  4/25  -at that time patient was treated medically  - patient now p/w with weakness  - on routine evaluation found to have elevaed CE to 8000's similar to recent hospitalization  - continue with heparin  drip -asa/statin  - f/u on further cardiac evaluation  - repeat echo pending   -of note last hospitalization echo w/o  wall motion abnormalities   Hypotension -supportive care with ns bolus, midodrine prn   Rhabdomyolysis  -due to prolonged immobilization  -continue to monitor CK -continue with ivfs   AKI - hold nephrotoxic medications  - ivfs   Chpef -ef 65% with grade 1 diastolic dysfunction  -not on lasix    PMR -with chronic debility  -check ESR  -pt/ot  HTN -currently borderline  - will hold all anti-hypertensive currently    HLD -continue statin    DVT prophylaxis: heparin  drip Code Status: DNRl/ as discussed per patient wishes in event of cardiac arrest  Family Communication: none at bedside Disposition Plan: patient  expected to be admitted greater than 2 midnights  Consults called: Cardiology DR Venson Admission status: progressive care    Camila DELENA Ned MD [facility]   If 7PM-7AM, please contact night-coverage [email or web address] Password [facility]  09/10/2023, 12:25 PM

## 2023-09-10 NOTE — ED Notes (Signed)
 This RN attempted in and out cath Pt had no urine, did a bladder scan. Attending notified.

## 2023-09-10 NOTE — Consult Note (Signed)
 PHARMACY - ANTICOAGULATION CONSULT NOTE  Pharmacy Consult for heparin  Indication: chest pain/ACS  Allergies  Allergen Reactions   Alendronate Sodium     REACTION: GI    Patient Measurements: Height: 4' 9 (144.8 cm) Weight: 56.2 kg (124 lb) IBW/kg (Calculated) : 38.6 HEPARIN  DW (KG): 50.6  Vital Signs: Temp: 97.9 F (36.6 C) (06/29 0849) Temp Source: Oral (06/29 0849) BP: 138/73 (06/29 1038) Pulse Rate: 103 (06/29 1038)  Labs: Recent Labs    09/10/23 0929  HGB 13.5  HCT 40.7  PLT 269  CREATININE 1.61*  CKTOTAL 5,457*  TROPONINIHS 8,270*    Estimated Creatinine Clearance: 14.4 mL/min (A) (by C-G formula based on SCr of 1.61 mg/dL (H)).   Medical History: Past Medical History:  Diagnosis Date   Diverticulosis    GERD (gastroesophageal reflux disease)    Hyperlipidemia    Hypertension    Osteoporosis    Polymyalgia rheumatica (HCC)    Wears dentures    partial upper and lower   Wears hearing aid in both ears     Medications:  - Not on anticoagulation at home per chart review.  - Unsure if she is currently taking aspirin    Assessment: 89 YO female admitted for ACS. PMH includes NSTEMI 06/2023 (medical management), HTN, HLD, HFpEF, GERD, rhabdomyolysis, diverticulosis, osteoporosis. Hgb 13.5, plts 269. Troponin 8270 > 8945, CK 5457. EKG without significant ST elevation. Creatinine elevated (baseline ~0.8) to 1.61.   Goal of Therapy:  Heparin  level 0.3-0.7 units/ml Monitor platelets by anticoagulation protocol: Yes   Plan:  - Heparin  bolus 3,300 units followed by heparin  infusion 650 units/hr  - Baseline aPPt, PT/INR, and CBC ordered  - 8 hour heparin  level after initiation of heparin  infusion  - Daily CBC  - Monitor for s/sx of bleeding   Laneta Cornell, PharmD Candidate  09/10/2023,11:05 AM

## 2023-09-10 NOTE — ED Notes (Signed)
 RN attempted to give pt pills crushed in apples sauce, she got them down but then started to dry heave and vomit. Pt needs consult for speech.

## 2023-09-10 NOTE — ED Notes (Signed)
 Lab at bedside to collect Pt labs, IV team consulted to come place IV for Pt as she is a very difficult stick. See new order.

## 2023-09-10 NOTE — ED Notes (Signed)
 This RN called lab to come draw labs on Pt

## 2023-09-10 NOTE — ED Triage Notes (Signed)
 Arrives via EMS from [facility]  Nurse aide found Pt on her knees in the corner Unk how long Pt was on the floor extremities are cold and hard to feel pulses Unk if she lost consciousness, Pt states she did not fall  Unk if she is on blood thinner  EMS Vitals: 3L 95%. 139/77, HR 110, 97.1 Axillary, CBG 226, Sinus tachycardia on EMS ECG Hx: Rhabdom, NSTEMi MI a couple months ago, HTN, Hx of falls   EMS interventions: #20 Right wrist  Pt c/o knee pain  Pt DNR

## 2023-09-10 NOTE — ED Notes (Signed)
 EDP notified of abnormal troponin.

## 2023-09-10 NOTE — ED Notes (Signed)
 RN to bedside. Pt is very restless. Has pulled off her oxygen probe, is still breathing rapidly and more confused than she was earlier per the daughter. MD messgaed.

## 2023-09-10 NOTE — ED Notes (Signed)
Fall bundle in place

## 2023-09-10 NOTE — ED Notes (Signed)
 IV team at bedside

## 2023-09-10 NOTE — ED Provider Notes (Signed)
 [facility] Provider Note    Event Date/Time   First MD Initiated Contact with Patient 09/10/23 0831     (approximate)  History   Chief Complaint: Fall  HPI  Ann Odom is a 89 y.o. female with a past medical history of gastric reflux, hypertension, hyperlipidemia, presents to the emergency department for a fall.  According to report patient was found down on the floor of her facility on her knees.  He is not entirely clear how long the patient had been on her knees with her legs folded under her.  Patient has abrasions to both of her knees.  Patient seems to be a good historian however states she did not fall but lowered down to the ground was unable to get back up.  Patient denies hitting her head or LOC.  Patient is complaining of significant pain in her right knee as well as both of her lower extremities.  Physical Exam   Triage Vital Signs: ED Triage Vitals  Encounter Vitals Group     BP --      Girls Systolic BP Percentile --      Girls Diastolic BP Percentile --      Boys Systolic BP Percentile --      Boys Diastolic BP Percentile --      Pulse --      Resp --      Temp --      Temp src --      SpO2 09/10/23 0834 95 %     Weight 09/10/23 0837 124 lb (56.2 kg)     Height 09/10/23 0837 4' 9 (1.448 m)     Head Circumference --      Peak Flow --      Pain Score --      Pain Loc --      Pain Education --      Exclude from Growth Chart --     Most recent vital signs: Vitals:   09/10/23 0834  SpO2: 95%    General: Awake, no distress.  CV:  Good peripheral perfusion.  Regular rate and rhythm  Resp:  Normal effort.  Equal breath sounds bilaterally.  Abd:  No distention.  Soft, nontender.  No rebound or guarding. Other:  No hip tenderness to palpation.  Patient has 1-2+ lower extremity edema bilaterally.  Does have abrasions over both of her knees.  Unable to palpate DP pulses however strong DP pulses on Doppler.  Some erythema of the  right lower extremity likely due to it being flexed under her body for so long.   ED Results / Procedures / Treatments   EKG  EKG viewed and interpreted by myself shows sinus tachycardia at 107 bpm with a narrow QRS, normal axis, largely normal intervals with nonspecific ST changes without significant ST elevation.  RADIOLOGY  I have reviewed interpreted the x-ray images of the right knee I do not appreciate any obvious fracture X-ray is negative.  CT scan of the head shows no acute findings.  MEDICATIONS ORDERED IN ED: Medications - No data to display   IMPRESSION / MDM / ASSESSMENT AND PLAN / ED COURSE  I reviewed the triage vital signs and the nursing notes.  Patient's presentation is most consistent with acute presentation with potential threat to life or bodily function.  Patient presents to the emergency department for fall/being down for prolonged time on her knees.  EMS states when they found her on the floor  her legs were underneath of her and she was on her knees.  Has been like this for quite some time although the patient is unable to quantify exactly how long.  We will dose a small amount of pain medication, we will check labs including a CK and a troponin.  Will also obtain a CT scan of the head as a precaution as well as a right knee x-ray.  Will continue to closely monitor the patient we will IV hydrate with a small amount of normal saline while awaiting results.  Patient's imaging shows no significant finding.  Patient's lab work however shows mild leukocytosis of 16,000, chemistry shows slight anion gap elevation with a creatinine of 1.6 with some mild renal insufficiency.  Patient's CK is elevated to 5400 consistent with rhabdomyolysis.  Patient's troponin is elevated to 8200 consistent with an NSTEMI.  EKG shows nonspecific findings.  I spoke to cardiology Dr. Budd Kindle, we will start the patient on heparin  and he will consult.  Will admit to the hospital service for  further workup and treatment.  Patient's niece is in the room with her and I have explained the findings and plan of care.  Patient has a DO NOT RESUSCITATE order.  CRITICAL CARE Performed by: Franky Moores   Total critical care time: 30 minutes  Critical care time was exclusive of separately billable procedures and treating other patients.  Critical care was necessary to treat or prevent imminent or life-threatening deterioration.  Critical care was time spent personally by me on the following activities: development of treatment plan with patient and/or surrogate as well as nursing, discussions with consultants, evaluation of patient's response to treatment, examination of patient, obtaining history from patient or surrogate, ordering and performing treatments and interventions, ordering and review of laboratory studies, ordering and review of radiographic studies, pulse oximetry and re-evaluation of patient's condition.   FINAL CLINICAL IMPRESSION(S) / ED DIAGNOSES   Fall Rhabdomyolysis Renal insufficiency NSTEMI   Note:  This document was prepared using Dragon voice recognition software and may include unintentional dictation errors.   Moores Franky, MD 09/10/23 1146

## 2023-09-11 ENCOUNTER — Inpatient Hospital Stay

## 2023-09-11 ENCOUNTER — Encounter: Payer: Self-pay | Admitting: Internal Medicine

## 2023-09-11 DIAGNOSIS — Z515 Encounter for palliative care: Secondary | ICD-10-CM

## 2023-09-11 DIAGNOSIS — M6282 Rhabdomyolysis: Secondary | ICD-10-CM

## 2023-09-11 DIAGNOSIS — I517 Cardiomegaly: Secondary | ICD-10-CM | POA: Diagnosis not present

## 2023-09-11 DIAGNOSIS — R0602 Shortness of breath: Secondary | ICD-10-CM | POA: Diagnosis not present

## 2023-09-11 DIAGNOSIS — J90 Pleural effusion, not elsewhere classified: Secondary | ICD-10-CM | POA: Diagnosis not present

## 2023-09-11 DIAGNOSIS — I214 Non-ST elevation (NSTEMI) myocardial infarction: Secondary | ICD-10-CM | POA: Diagnosis not present

## 2023-09-11 DIAGNOSIS — I950 Idiopathic hypotension: Secondary | ICD-10-CM

## 2023-09-11 DIAGNOSIS — R140 Abdominal distension (gaseous): Secondary | ICD-10-CM | POA: Diagnosis not present

## 2023-09-11 LAB — CBC
HCT: 40.8 % (ref 36.0–46.0)
Hemoglobin: 13.8 g/dL (ref 12.0–15.0)
MCH: 31.2 pg (ref 26.0–34.0)
MCHC: 33.8 g/dL (ref 30.0–36.0)
MCV: 92.3 fL (ref 80.0–100.0)
Platelets: 239 10*3/uL (ref 150–400)
RBC: 4.42 MIL/uL (ref 3.87–5.11)
RDW: 13.4 % (ref 11.5–15.5)
WBC: 16.9 10*3/uL — ABNORMAL HIGH (ref 4.0–10.5)
nRBC: 0 % (ref 0.0–0.2)

## 2023-09-11 LAB — HEPARIN LEVEL (UNFRACTIONATED): Heparin Unfractionated: 0.54 [IU]/mL (ref 0.30–0.70)

## 2023-09-11 LAB — LIPID PANEL
Cholesterol: 159 mg/dL (ref 0–200)
HDL: 73 mg/dL (ref >40)
LDL Cholesterol: 68 mg/dL (ref 0–99)
Total CHOL/HDL Ratio: 2.2 ratio
Triglycerides: 89 mg/dL (ref <150)
VLDL: 18 mg/dL (ref 0–40)

## 2023-09-11 LAB — TROPONIN I (HIGH SENSITIVITY): Troponin I (High Sensitivity): >24000 ng/L (ref <18)

## 2023-09-11 LAB — CK: Total CK: >20000 U/L — ABNORMAL HIGH (ref 38–234)

## 2023-09-11 MED ORDER — DIPHENHYDRAMINE HCL 50 MG/ML IJ SOLN
25.0000 mg | Freq: Once | INTRAMUSCULAR | Status: AC
  Administered 2023-09-11: 25 mg via INTRAVENOUS
  Filled 2023-09-11: qty 1

## 2023-09-11 MED ORDER — TRAZODONE HCL 50 MG PO TABS: 25.0000 mg | ORAL_TABLET | Freq: Every evening | ORAL | Status: DC | PRN

## 2023-09-11 MED ORDER — ACETAMINOPHEN 325 MG PO TABS
650.0000 mg | ORAL_TABLET | Freq: Four times a day (QID) | ORAL | Status: DC | PRN
Start: 2023-09-11 — End: 2023-09-12

## 2023-09-11 MED ORDER — LORAZEPAM 1 MG PO TABS: 1.0000 mg | ORAL_TABLET | ORAL | Status: DC | PRN

## 2023-09-11 MED ORDER — MORPHINE 100MG IN NS 100ML (1MG/ML) PREMIX INFUSION
1.0000 mg/h | INTRAVENOUS | Status: DC
  Administered 2023-09-11: 1 mg/h via INTRAVENOUS
  Filled 2023-09-11: qty 100

## 2023-09-11 MED ORDER — GLYCOPYRROLATE 1 MG PO TABS: 1.0000 mg | ORAL_TABLET | ORAL | Status: DC | PRN

## 2023-09-11 MED ORDER — BIOTENE DRY MOUTH MT LIQD: 15.0000 mL | OROMUCOSAL | Status: DC | PRN

## 2023-09-11 MED ORDER — LORAZEPAM 2 MG/ML IJ SOLN
1.0000 mg | INTRAMUSCULAR | Status: DC | PRN
  Administered 2023-09-11: 1 mg via INTRAVENOUS
  Filled 2023-09-11: qty 1

## 2023-09-11 MED ORDER — LORAZEPAM 2 MG/ML PO CONC: 1.0000 mg | ORAL | Status: DC | PRN

## 2023-09-11 MED ORDER — HALOPERIDOL LACTATE 5 MG/ML IJ SOLN: 0.5000 mg | INTRAMUSCULAR | Status: DC | PRN

## 2023-09-11 MED ORDER — GLYCOPYRROLATE 0.2 MG/ML IJ SOLN: 0.2000 mg | INTRAMUSCULAR | Status: DC | PRN

## 2023-09-11 MED ORDER — ACETAMINOPHEN 650 MG RE SUPP: 650.0000 mg | Freq: Four times a day (QID) | RECTAL | Status: DC | PRN

## 2023-09-11 MED ORDER — GLYCOPYRROLATE 0.2 MG/ML IJ SOLN
0.2000 mg | INTRAMUSCULAR | Status: DC | PRN
  Administered 2023-09-11 (×2): 0.2 mg via INTRAVENOUS
  Filled 2023-09-11 (×3): qty 1

## 2023-09-11 MED ORDER — POLYVINYL ALCOHOL 1.4 % OP SOLN: 1.0000 [drp] | Freq: Four times a day (QID) | OPHTHALMIC | Status: DC | PRN

## 2023-09-11 MED ORDER — HALOPERIDOL 0.5 MG PO TABS: 0.5000 mg | ORAL_TABLET | ORAL | Status: DC | PRN

## 2023-09-11 MED ORDER — MORPHINE SULFATE (PF) 2 MG/ML IV SOLN
1.0000 mg | INTRAVENOUS | Status: DC | PRN
  Administered 2023-09-11 (×3): 1 mg via INTRAVENOUS
  Filled 2023-09-11: qty 1

## 2023-09-11 MED ORDER — HALOPERIDOL LACTATE 2 MG/ML PO CONC: 0.5000 mg | ORAL | Status: DC | PRN

## 2023-09-11 MED ORDER — MORPHINE SULFATE (PF) 2 MG/ML IV SOLN
1.0000 mg | Freq: Once | INTRAVENOUS | Status: AC
  Administered 2023-09-11: 1 mg via INTRAVENOUS
  Filled 2023-09-11: qty 1

## 2023-09-11 MED ORDER — MORPHINE SULFATE (PF) 2 MG/ML IV SOLN
1.0000 mg | INTRAVENOUS | Status: DC | PRN
  Administered 2023-09-11: 2 mg via INTRAVENOUS
  Filled 2023-09-11 (×2): qty 1

## 2023-09-12 LAB — LIPOPROTEIN A (LPA): Lipoprotein (a): <8.4 nmol/L (ref <75.0)

## 2023-09-12 NOTE — Progress Notes (Signed)
 Progress Note   Patient: Ann Odom FMW:[id number] DOB: 1923-12-07 DOA: 09/10/2023     1 DOS: the patient was seen and examined on 04-Oct-2023   Brief hospital course: Taken from H&P.  BURKLEY DECH is a 89 y.o. female with medical history significant of  GERD, PMR, HTN , HLD, CHFpef grade I diastolic dysfunction, CAD s/p recent  NSTEMI 4/25), Osteoporosis who presents to ED after being found down on her knees in her room after having a fall hours before per SNF.  Patient was complaining of bilateral lower extremity pain.  On presentation patient was little tachycardic otherwise stable vitals.  Labs with leukocytosis at 16.1, creatinine at 1.61, CK5 457, troponin 8945>.18908.  BNP 538. EKG with NSR and RBBB CT head and knee imaging was negative for any acute abnormality  Patient was admitted for concern of NSTEMI, cardiology was consulted and she was started on heparin  infusion.  10/04/23: Patient with overnight agitation and remained tachypneic, otherwise stable vitals.  No hypoxia.  Labs with persistent leukocytosis.  Last troponin check yesterday was still rising and at 21,216.  Family does not want any cardiac procedures including echocardiogram at this time.  Palliative care was also consulted.  Repeat chest x-ray today was negative for any pulmonary edema.  Patient remained quite tachypneic and agitated.  She appears very uncomfortable.  Discussed with family at bedside and they decided to transition her to full comfort care.  Patient is being started on morphine infusion and end-of-life comfort care were initiated.  Anticipating hospital death.  Assessment and Plan: * NSTEMI (non-ST elevated myocardial infarction) [facility]) Patient initially admitted with NSTEMI, troponin now more than 24,000 with more than 10,000 CK levels.  Appears very uncomfortable and tachypneic.  History of recent NSTEMI few weeks ago.  After that patient did well.  Family decided to proceed with full comfort care  and active management was withdrawn.  -Started on end-of-life comfort care -Morphine infusion was initiated -Anticipating hospital death   Subjective: Patient was very tachypneic and appears uncomfortable when seen today.  Pale with lifeless eyes and not being able to participate with any meaningful conversation or responses.  Daughter at bedside.  She wants her mother to be comfortable knowing that she is dying.  Physical Exam: Vitals:   10-04-2023 0930 2023/10/04 1000 10-04-23 1030 04-Oct-2023 1100  BP: 114/67 109/68 113/62 115/63  Pulse: 96 98 96 94  Resp: (!) 44 (!) 61 (!) 60   Temp:      TempSrc:      SpO2: (!) 88% (!) 89% (!) 88% 90%  Weight:      Height:       General.  Frail, pale and ill-appearing elderly lady, appears very uncomfortable Pulmonary.  A lot of gurgling sounds but chest was clear, significant increase work of breathing. CV.  Regular rate and rhythm, no JVD, rub or murmur. Abdomen.  Soft, nontender, nondistended, BS positive. Extremities.  No edema,  pulses intact and symmetrical.  Data Reviewed: Prior data reviewed  Family Communication: Discussed with daughter at bedside  Disposition: Status is: Inpatient Remains inpatient appropriate because: Severity of illness, patient is now being transitioned to full comfort care.  Anticipating hospital death  Planned Discharge Destination:   Time spent: 50 minutes  This record has been created using Conservation officer, historic buildings. Errors have been sought and corrected,but may not always be located. Such creation errors do not reflect on the standard of care.   Author: Amaryllis Dare, MD 10/04/23  12:55 PM  For on call review [email or web address].

## 2023-09-12 NOTE — ED Notes (Signed)
 This writer bladder scanned pt. 50ml in bladder

## 2023-09-12 NOTE — Assessment & Plan Note (Signed)
 Patient initially admitted with NSTEMI, troponin now more than 24,000 with more than 10,000 CK levels.  Appears very uncomfortable and tachypneic.  History of recent NSTEMI few weeks ago.  After that patient did well.  Family decided to proceed with full comfort care and active management was withdrawn.  -Started on end-of-life comfort care -Morphine infusion was initiated -Anticipating hospital death

## 2023-09-12 NOTE — Progress Notes (Signed)
   10-10-2023 1430  Spiritual Encounters  Type of Visit Follow up  Care provided to: Pt and family (Daughter and Niece at bedside)  Conversation partners present during Programmer, systems  Referral source Chaplain assessment  Reason for visit End-of-life  OnCall Visit Yes  Spiritual Framework  Presenting Themes Meaning/purpose/sources of inspiration;Goals in life/care;Values and beliefs;Significant life change;Impactful experiences and emotions;Coping tools;Other (comment) (for Family)  Interventions  Spiritual Care Interventions Made Compassionate presence;Reflective listening;Normalization of emotions;Supported grief process;Other (comment) (for Family)  Intervention Outcomes  Outcomes Connection to spiritual care;Awareness around self/spiritual resourses;Connection to values and goals of care;Awareness of health;Awareness of support;Reduced anxiety;Other (comment) (for Family)

## 2023-09-12 NOTE — Hospital Course (Addendum)
 Taken from H&P.  Ann Odom is a 89 y.o. female with medical history significant of  GERD, PMR, HTN , HLD, CHFpef grade I diastolic dysfunction, CAD s/p recent  NSTEMI 4/25), Osteoporosis who presents to ED after being found down on her knees in her room after having a fall hours before per SNF.  Patient was complaining of bilateral lower extremity pain.  On presentation patient was little tachycardic otherwise stable vitals.  Labs with leukocytosis at 16.1, creatinine at 1.61, CK5 457, troponin 8945>.18908.  BNP 538. EKG with NSR and RBBB CT head and knee imaging was negative for any acute abnormality  Patient was admitted for concern of NSTEMI, cardiology was consulted and she was started on heparin  infusion.  2023/09/17: Patient with overnight agitation and remained tachypneic, otherwise stable vitals.  No hypoxia.  Labs with persistent leukocytosis.  Last troponin check yesterday was still rising and at 21,216.  Family does not want any cardiac procedures including echocardiogram at this time.  Palliative care was also consulted.  Repeat chest x-ray today was negative for any pulmonary edema.  Patient remained quite tachypneic and agitated.  She appears very uncomfortable.  Discussed with family at bedside and they decided to transition her to full comfort care.  Patient is being started on morphine infusion and end-of-life comfort care were initiated.  Anticipating hospital death.

## 2023-09-12 NOTE — Progress Notes (Signed)
   2023/09/16 0919  Spiritual Encounters  Type of Visit Initial  Care provided to: Pt and family (Niece and Daughter at bedside; Pt moving to comfort measures)  Referral source Nurse (RN/NT/LPN)  Reason for visit Urgent spiritual support  OnCall Visit Yes  Spiritual Framework  Presenting Themes Meaning/purpose/sources of inspiration;Goals in life/care;Values and beliefs;Impactful experiences and emotions;Other (comment) (for Family)  Interventions  Spiritual Care Interventions Made Established relationship of care and support;Compassionate presence;Reflective listening;Normalization of emotions;Narrative/life review;Explored values/beliefs/practices/strengths;Prayer;Supported grief process;Other (comment) (for Family)  Intervention Outcomes  Outcomes Connection to spiritual care;Connection to values and goals of care;Awareness of support;Reduced anxiety;Other (comment) (for Family)

## 2023-09-12 NOTE — Consult Note (Signed)
 PHARMACY - ANTICOAGULATION CONSULT NOTE  Pharmacy Consult for heparin  Indication: chest pain/ACS  Allergies  Allergen Reactions   Alendronate Sodium     REACTION: GI    Patient Measurements: Height: 4' 9 (144.8 cm) Weight: 56.2 kg (124 lb) IBW/kg (Calculated) : 38.6 HEPARIN  DW (KG): 50.6  Vital Signs: Temp: 98 F (36.7 C) 10-09-23 0523) Temp Source: Axillary October 09, 2023 0523) BP: 127/70 10-09-2023 0441) Pulse Rate: 95 2023/10/09 0441)  Labs: Recent Labs    09/10/23 0929 09/10/23 1042 09/10/23 1128 09/10/23 1450 09/10/23 1618 09/10/23 1631 10/09/2023 0456  HGB 13.5  --   --   --   --   --  13.8  HCT 40.7  --   --   --   --   --  40.8  PLT 269  --   --   --   --   --  239  APTT  --   --  26  --   --   --   --   LABPROT  --   --  13.6  --   --   --   --   INR  --   --  1.0  --   --   --   --   HEPARINUNFRC  --   --   --   --   --  <0.10* 0.54  CREATININE 1.61*  --   --   --   --   --   --   CKTOTAL 5,457*  --   --   --   --   --   --   TROPONINIHS 8,270* 8,945*  --  18,908* 21,216*  --   --     Estimated Creatinine Clearance: 14.4 mL/min (A) (by C-G formula based on SCr of 1.61 mg/dL (H)).   Medical History: Past Medical History:  Diagnosis Date   Diverticulosis    GERD (gastroesophageal reflux disease)    Hyperlipidemia    Hypertension    Osteoporosis    Polymyalgia rheumatica (HCC)    Wears dentures    partial upper and lower   Wears hearing aid in both ears     Medications:  - Not on anticoagulation at home per chart review.  - Unsure if she is currently taking aspirin    Assessment: 89 YO female admitted for ACS. PMH includes NSTEMI 06/2023 (medical management), HTN, HLD, HFpEF, GERD, rhabdomyolysis, diverticulosis, osteoporosis. Hgb 13.5, plts 269. Troponin 8270 > 8945, CK 5457. EKG without significant ST elevation. Creatinine elevated (baseline ~0.8) to 1.61.   6/29 1631  HL <0.10 09-Oct-2023 0456 HL 0.54, therapeutic x 1  Goal of Therapy:  Heparin  level 0.3-0.7  units/ml Monitor platelets by anticoagulation protocol: Yes   Plan:  - Continue heparin  infusion at 800 units/hr  - Recheck heparin  level 8 hours to confirm  - Daily CBC  - Monitor for s/sx of bleeding   Rankin CANDIE Dills, PharmD, [facility] 10/09/2023 5:53 AM

## 2023-09-12 NOTE — Progress Notes (Signed)
 SLP Cancellation Note  Patient Details Name: Ann Odom MRN: [id number] DOB: 06-Oct-1923   Cancelled treatment:       Reason Eval/Treat Not Completed: Other (comment) Pt has transitioned to comfort care. NO SLP services indicated. MD aware of plan.   [name], MS, CCC-SLP Speech Language Pathologist Rehab Services; [facility] [phone] (ascom)     [name] 2023/10/11, 11:31 AM

## 2023-09-12 NOTE — Consult Note (Signed)
 Consultation Note Date: 2023-09-21 at 1200   Patient Name: Ann Odom  DOB: 1924/02/03  MRN: [id number]  Age / Sex: 89 y.o., female  PCP: Abdul Fine, MD Referring Physician: Caleen Qualia, MD  HPI/Patient Profile: 89 y.o. female  with past medical history of GERD, PMR, HTN, HLD, CHF with grade 1 diastolic dysfunction, CAD s/p NSTEMI (4/25), and osteoporosis admitted on 09/10/2023 with bilateral lower extremity pain after being found down on her knees after a fall at her SNF.  Patient was admitted for concern of NSTEMI, cardiology was consulted, and heparin  drip was initiated.  Overnight of 6/13, patient became agitated, and tachypneic without hypoxia.  After discussing with attending Dr. Caleen, family does not wish to pursue any further workup, cardiac procedures.  Patient's care was shifted to comfort focused measures and PMT was consulted to support patient and family with comfort focused care.  Clinical Assessment and Goals of Care: Extensive chart review completed prior to meeting patient including labs, vital signs, imaging, progress notes, orders, and available advanced directive documents from current and previous encounters. I then met with patient and her daughter at bedside to discuss diagnosis prognosis, GOC, EOL wishes, disposition and options.  I introduced Palliative Medicine as specialized medical care for people living with serious illness. It focuses on providing relief from the symptoms and stress of a serious illness. The goal is to improve quality of life for both the patient and the family.  We discussed a brief life review of the patient.  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  She is a widow, and her other daughter passed away a few years ago suddenly and unexpectedly.  Patient's next of  kin is her only daughter [name].   As far as functional and nutritional status Mollie shares that patient was up, independent, and planting flowers/gardening just last weekend.  She shares this has happened before where her mother has had breathing problems.  However, she feels that this time is different.  We discussed patient's current illness-NSTEMI-and what it means in the larger context of patient's on-going co-morbidities - advanced age, tachypnea.  Natural disease trajectory and expectations at EOL were discussed.  Signs and symptoms of impending death such as drooping of the nasolabial folds and hyperextension of the neck reviewed with daughter.  She was appreciative of this discussion.  Full comfort measures discussed in detail again.  Patient's daughter remains in agreement with comfort focused care.  Adjustment made to [facility].  Extensive discussion with RN outlining comfort measures and orders.  Therapeutic silence, active listening, and emotional support provided to patient and daughter.  PMT remains available I will continue to follow patient throughout her hospitalization.  Full comfort measures continue.  Primary Decision Maker NEXT OF KIN  Physical Exam Vitals reviewed.  Constitutional:      General: She is in acute distress.     Appearance: She is ill-appearing.  HENT:     Nose:  Nose normal.     Mouth/Throat:     Mouth: Mucous membranes are dry.  Pulmonary:     Effort: Respiratory distress present.  Abdominal:     Palpations: Abdomen is soft.   Skin:    General: Skin is warm and dry.   Neurological:     Comments: Non responsive to verbal or physical stimuli    Palliative Assessment/Data: 20%     Thank you for this consult. Palliative medicine will continue to follow and assist holistically.   Time Total: 75 minutes  Time spent includes: Detailed review of medical records (labs, imaging, vital signs), medically appropriate exam (mental status,  respiratory, cardiac, skin), discussed with treatment team, counseling and educating patient, family and staff, documenting clinical information, medication management and coordination of care.  Signed by: Lamarr Gunner, DNP, FNP-BC Palliative Medicine   Please contact Palliative Medicine Team providers via [facility] for questions and concerns.

## 2023-09-12 NOTE — ED Notes (Signed)
 This RN reached out to [facility] for persistent restlessness noted for patient. This RN asked if there was anything we could do for the pt to promote rest and comfort. MD Mansy gave a verbal order for one time dose of Benadryl  and Trazadone PRN at night. See eMAR

## 2023-09-12 DEATH — deceased

## 2023-09-29 ENCOUNTER — Encounter: Admitting: Student

## 2023-10-13 NOTE — Death Summary Note (Signed)
 DEATH SUMMARY   Patient Details  Name: Ann Odom MRN: [id number] DOB: [birth year] ERE:Azjfzm, Richerd, MD Admission/Discharge Information   Admit Date:  October 07, 2023  Date of Death: Date of Death: 2023/10/08  Time of Death: Time of Death: 1800  Length of Stay: 1   Principle Cause of death: NSTEMI  Hospital Diagnoses: Principal Problem:   NSTEMI (non-ST elevated myocardial infarction) [facility]) Active Problems:   Idiopathic hypotension   Hospital Course: Taken from H&P.  Ann Odom is a 89 y.o. female with medical history significant of  GERD, PMR, HTN , HLD, CHFpef grade I diastolic dysfunction, CAD s/p recent  NSTEMI 4/25), Osteoporosis who presents to ED after being found down on her knees in her room after having a fall hours before per SNF.  Patient was complaining of bilateral lower extremity pain.  On presentation patient was little tachycardic otherwise stable vitals.  Labs with leukocytosis at 16.1, creatinine at 1.61, CK5 457, troponin [id number]>.18908.  BNP 538. EKG with NSR and RBBB CT head and knee imaging was negative for any acute abnormality  Patient was admitted for concern of NSTEMI, cardiology was consulted and she was started on heparin  infusion.  10-08-2023: Patient with overnight agitation and remained tachypneic, otherwise stable vitals.  No hypoxia.  Labs with persistent leukocytosis.  Last troponin check yesterday was still rising and at 21,216.  Family does not want any cardiac procedures including echocardiogram at this time.  Palliative care was also consulted.  Repeat chest x-ray today was negative for any pulmonary edema.  Patient remained quite tachypneic and agitated.  She appears very uncomfortable.  Discussed with family at bedside and they decided to transition her to full comfort care.  Patient is being started on morphine infusion and end-of-life comfort care were initiated.  Anticipating hospital death.  Patient later passed around 6 PM peacefully  in the presence of family.  Assessment and Plan: * NSTEMI (non-ST elevated myocardial infarction) [facility]) Patient initially admitted with NSTEMI, troponin now more than 24,000 with more than 10,000 CK levels.  Appears very uncomfortable and tachypneic.  History of recent NSTEMI few weeks ago.  After that patient did well.  Family decided to proceed with full comfort care and active management was withdrawn.  -Started on end-of-life comfort care -Morphine infusion was initiated -Anticipating hospital death         Procedures: None  Consultations: Cardiology  The results of significant diagnostics from this hospitalization (including imaging, microbiology, ancillary and laboratory) are listed below for reference.   Significant Diagnostic Studies: DG Chest Port 1 View Result Date: 10/08/23 CLINICAL DATA:  89 year old female with shortness of breath. EXAM: PORTABLE CHEST 1 VIEW COMPARISON:  Portable chest 06/16/2023. FINDINGS: Portable AP supine view at 0719 hours. Cardiomegaly and veiling bilateral lung base opacity appear new or increased since April. Lung volumes remain normal. No pneumothorax on this supine view. No air bronchograms. Pulmonary vascularity not significantly changed, no overt edema. Calcified aortic atherosclerosis. Visualized tracheal air column is within normal limits. Spine degeneration and scoliosis. No acute osseous abnormality identified. Paucity of bowel gas in the visible abdomen. IMPRESSION: 1. Cardiomegaly with evidence of small bilateral pleural effusions. No overt pulmonary edema. 2.  Aortic Atherosclerosis (ICD10-I70.0). Electronically Signed   By: VEAR Hurst M.D.   On: 10-08-23 07:25   DG Knee Complete 4 Views Right Result Date: 2023-10-07 CLINICAL DATA:  89 year old female found down. EXAM: RIGHT KNEE - COMPLETE 4+ VIEW COMPARISON:  Right knee series 12/09/2013. FINDINGS: Four  views. Bone mineralization, joint spaces and alignment are normal for age. No  evidence of joint effusion on the lateral view. Patella appears intact. No acute osseous abnormality identified. IMPRESSION: No acute fracture or dislocation identified about the right knee. Electronically Signed   By: VEAR Hurst M.D.   On: 09/10/2023 09:18   CT HEAD WO CONTRAST ( ) Result Date: 09/10/2023 CLINICAL DATA:  89 year old female found down. EXAM: CT HEAD WITHOUT CONTRAST TECHNIQUE: Contiguous axial images were obtained from the base of the skull through the vertex without intravenous contrast. RADIATION DOSE REDUCTION: This exam was performed according to the departmental dose-optimization program which includes automated exposure control, adjustment of the mA and/or kV according to patient size and/or use of iterative reconstruction technique. COMPARISON:  Head CT 06/16/2023. FINDINGS: Motion artifact at the skull base, base of the brain. Brain: Stable cerebral volume. No midline shift, ventriculomegaly, mass effect, evidence of mass lesion, intracranial hemorrhage or evidence of cortically based acute infarction. Stable gray-white matter differentiation throughout the brain. Vascular: No suspicious intracranial vascular hyperdensity. Skull: Motion artifact at the skull base. No acute osseous abnormality identified. Sinuses/Orbits: Visualized paranasal sinuses and mastoids are stable and well aerated. Other: Orbit motion artifact.  Scalp soft tissues appears stable. IMPRESSION: Motion artifact at the skull base. No acute intracranial abnormality or acute traumatic injury identified. Electronically Signed   By: VEAR Hurst M.D.   On: 09/10/2023 09:17    Microbiology: No results found for this or any previous visit (from the past 240 hours).  Time spent:  minutes  This record has been created using Conservation officer, historic buildings. Errors have been sought and corrected,but may not always be located. Such creation errors do not reflect on the standard of care.   Signed: Amaryllis Dare,  MD October 01, 2023

## 2024-01-18 LAB — BASIC METABOLIC PANEL WITHOUT GFR
BUN: 14 mg/dL (ref 7–25)
CO2: 24 mmol/L (ref 20–32)
Calcium: 9.5 mg/dL (ref 8.6–10.4)
Chloride: 93 mmol/L — ABNORMAL LOW (ref 98–110)
Creat: 0.83 mg/dL (ref 0.60–0.95)
Glucose, Bld: 92 mg/dL (ref 65–139)
Potassium: 4.5 mmol/L (ref 3.5–5.3)
Sodium: 125 mmol/L — ABNORMAL LOW (ref 135–146)
